# Patient Record
Sex: Male | Born: 1945 | Race: White | Hispanic: No | Smoking: Never smoker
Health system: Southern US, Community
[De-identification: ages and names within clinical notes are randomized; demographics above are authoritative.]

## PROBLEM LIST (undated history)

## (undated) DIAGNOSIS — G4730 Sleep apnea, unspecified: Secondary | ICD-10-CM

## (undated) DIAGNOSIS — Z85828 Personal history of other malignant neoplasm of skin: Secondary | ICD-10-CM

## (undated) DIAGNOSIS — F32A Depression, unspecified: Secondary | ICD-10-CM

## (undated) DIAGNOSIS — C3490 Malignant neoplasm of unspecified part of unspecified bronchus or lung: Secondary | ICD-10-CM

## (undated) DIAGNOSIS — K219 Gastro-esophageal reflux disease without esophagitis: Secondary | ICD-10-CM

## (undated) DIAGNOSIS — C7A8 Other malignant neuroendocrine tumors: Secondary | ICD-10-CM

## (undated) DIAGNOSIS — C61 Malignant neoplasm of prostate: Secondary | ICD-10-CM

## (undated) DIAGNOSIS — F329 Major depressive disorder, single episode, unspecified: Secondary | ICD-10-CM

## (undated) DIAGNOSIS — C4491 Basal cell carcinoma of skin, unspecified: Secondary | ICD-10-CM

## (undated) DIAGNOSIS — C801 Malignant (primary) neoplasm, unspecified: Secondary | ICD-10-CM

## (undated) DIAGNOSIS — I10 Essential (primary) hypertension: Secondary | ICD-10-CM

## (undated) HISTORY — PX: PENILE PROSTHESIS IMPLANT: SHX240

## (undated) HISTORY — PX: TOE SURGERY: SHX1073

## (undated) HISTORY — PX: PROSTATECTOMY: SHX69

## (undated) HISTORY — PX: COLONOSCOPY: SHX174

## (undated) HISTORY — PX: EYE SURGERY: SHX253

---
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
## 2007-03-16 HISTORY — PX: MOHS SURGERY: SUR867

## 2011-01-30 ENCOUNTER — Emergency Department (HOSPITAL_COMMUNITY)
Admission: EM | Admit: 2011-01-30 | Discharge: 2011-01-30 | Disposition: A | Discharge status: Other Acute Inpatient Hospital | Payer: Medicare Other | Source: Home / Self Care | Attending: Emergency Medicine | Admitting: Emergency Medicine

## 2011-01-30 ENCOUNTER — Telehealth (HOSPITAL_COMMUNITY): Payer: Self-pay | Admitting: Licensed Clinical Social Worker

## 2011-01-30 ENCOUNTER — Encounter: Payer: Self-pay | Admitting: Emergency Medicine

## 2011-01-30 ENCOUNTER — Encounter (HOSPITAL_COMMUNITY): Payer: Self-pay | Admitting: *Deleted

## 2011-01-30 ENCOUNTER — Inpatient Hospital Stay (HOSPITAL_COMMUNITY)
Admission: RE | Admit: 2011-01-30 | Discharge: 2011-02-11 | DRG: 885 | Disposition: A | Discharge status: Home / Self Care | Payer: Medicare Other | Source: Ambulatory Visit | Attending: Psychiatry | Admitting: Psychiatry

## 2011-01-30 DIAGNOSIS — Z880 Allergy status to penicillin: Secondary | ICD-10-CM

## 2011-01-30 DIAGNOSIS — F339 Major depressive disorder, recurrent, unspecified: Secondary | ICD-10-CM | POA: Diagnosis present

## 2011-01-30 DIAGNOSIS — Z818 Family history of other mental and behavioral disorders: Secondary | ICD-10-CM

## 2011-01-30 DIAGNOSIS — F332 Major depressive disorder, recurrent severe without psychotic features: Principal | ICD-10-CM

## 2011-01-30 DIAGNOSIS — Z6379 Other stressful life events affecting family and household: Secondary | ICD-10-CM

## 2011-01-30 DIAGNOSIS — F411 Generalized anxiety disorder: Secondary | ICD-10-CM

## 2011-01-30 DIAGNOSIS — Z7982 Long term (current) use of aspirin: Secondary | ICD-10-CM

## 2011-01-30 DIAGNOSIS — R45851 Suicidal ideations: Secondary | ICD-10-CM

## 2011-01-30 DIAGNOSIS — G4700 Insomnia, unspecified: Secondary | ICD-10-CM

## 2011-01-30 DIAGNOSIS — Z886 Allergy status to analgesic agent status: Secondary | ICD-10-CM

## 2011-01-30 DIAGNOSIS — F1994 Other psychoactive substance use, unspecified with psychoactive substance-induced mood disorder: Secondary | ICD-10-CM

## 2011-01-30 DIAGNOSIS — Z79899 Other long term (current) drug therapy: Secondary | ICD-10-CM

## 2011-01-30 DIAGNOSIS — F1010 Alcohol abuse, uncomplicated: Secondary | ICD-10-CM | POA: Diagnosis present

## 2011-01-30 DIAGNOSIS — H548 Legal blindness, as defined in USA: Secondary | ICD-10-CM

## 2011-01-30 DIAGNOSIS — Z8546 Personal history of malignant neoplasm of prostate: Secondary | ICD-10-CM

## 2011-01-30 HISTORY — DX: Depression, unspecified: F32.A

## 2011-01-30 HISTORY — DX: Major depressive disorder, single episode, unspecified: F32.9

## 2011-01-30 LAB — CBC
HCT: 39.4 % (ref 39.0–52.0)
Hemoglobin: 14.4 g/dL (ref 13.0–17.0)
MCH: 32.7 pg (ref 26.0–34.0)
MCHC: 36.5 g/dL — ABNORMAL HIGH (ref 30.0–36.0)
MCV: 89.3 fL (ref 78.0–100.0)

## 2011-01-30 LAB — RAPID URINE DRUG SCREEN, HOSP PERFORMED
Cocaine: NOT DETECTED
Opiates: NOT DETECTED

## 2011-01-30 LAB — BASIC METABOLIC PANEL
BUN: 12 mg/dL (ref 6–23)
CO2: 26 mEq/L (ref 19–32)
Chloride: 99 mEq/L (ref 96–112)
Creatinine, Ser: 0.95 mg/dL (ref 0.50–1.35)

## 2011-01-30 MED ORDER — ACETAMINOPHEN 325 MG PO TABS
650.0000 mg | ORAL_TABLET | ORAL | Status: DC | PRN
  Administered 2011-01-30: 650 mg via ORAL
  Filled 2011-01-30: qty 1

## 2011-01-30 MED ORDER — VITAMIN B-1 100 MG PO TABS
100.0000 mg | ORAL_TABLET | Freq: Every day | ORAL | Status: DC
  Administered 2011-01-31 – 2011-02-11 (×12): 100 mg via ORAL
  Filled 2011-01-30 (×13): qty 1

## 2011-01-30 MED ORDER — DULOXETINE HCL 30 MG PO CPEP
30.0000 mg | ORAL_CAPSULE | Freq: Every day | ORAL | Status: DC
  Administered 2011-01-31 – 2011-02-01 (×2): 30 mg via ORAL
  Filled 2011-01-30 (×2): qty 1

## 2011-01-30 MED ORDER — IBUPROFEN 200 MG PO TABS
600.0000 mg | ORAL_TABLET | Freq: Three times a day (TID) | ORAL | Status: DC | PRN
  Filled 2011-01-30: qty 1
  Filled 2011-01-30: qty 2

## 2011-01-30 MED ORDER — OLANZAPINE 5 MG PO TABS
5.0000 mg | ORAL_TABLET | Freq: Every day | ORAL | Status: DC
  Administered 2011-01-30 – 2011-02-10 (×12): 5 mg via ORAL
  Filled 2011-01-30 (×13): qty 1

## 2011-01-30 MED ORDER — THIAMINE HCL 100 MG/ML IJ SOLN: 100.0000 mg | Freq: Once | INTRAMUSCULAR | Status: DC

## 2011-01-30 MED ORDER — HYDROXYZINE PAMOATE 25 MG PO CAPS
25.0000 mg | ORAL_CAPSULE | Freq: Four times a day (QID) | ORAL | Status: AC | PRN
  Administered 2011-01-31: 25 mg via ORAL

## 2011-01-30 MED ORDER — ONDANSETRON 4 MG PO TBDP: 4.0000 mg | ORAL_TABLET | Freq: Four times a day (QID) | ORAL | Status: AC | PRN

## 2011-01-30 MED ORDER — IBUPROFEN 600 MG PO TABS: 600.0000 mg | ORAL_TABLET | Freq: Three times a day (TID) | ORAL | Status: DC | PRN

## 2011-01-30 MED ORDER — THERA M PLUS PO TABS
1.0000 | ORAL_TABLET | Freq: Every day | ORAL | Status: DC
  Administered 2011-01-31 – 2011-02-05 (×6): 1 via ORAL
  Administered 2011-02-06 – 2011-02-07 (×2): via ORAL
  Administered 2011-02-08 – 2011-02-11 (×4): 1 via ORAL
  Filled 2011-01-30 (×12): qty 1

## 2011-01-30 MED ORDER — ONDANSETRON HCL 8 MG PO TABS: 4.0000 mg | ORAL_TABLET | Freq: Three times a day (TID) | ORAL | Status: DC | PRN

## 2011-01-30 MED ORDER — DULOXETINE HCL 30 MG PO CPEP
30.0000 mg | ORAL_CAPSULE | Freq: Every day | ORAL | Status: DC
  Filled 2011-01-30: qty 1

## 2011-01-30 MED ORDER — CHLORDIAZEPOXIDE HCL 25 MG PO CAPS: 25.0000 mg | ORAL_CAPSULE | Freq: Four times a day (QID) | ORAL | Status: AC | PRN

## 2011-01-30 MED ORDER — ASPIRIN EC 81 MG PO TBEC
81.0000 mg | DELAYED_RELEASE_TABLET | Freq: Every day | ORAL | Status: DC
  Administered 2011-01-31 – 2011-02-11 (×12): 81 mg via ORAL
  Filled 2011-01-30 (×13): qty 1

## 2011-01-30 MED ORDER — OLANZAPINE 5 MG PO TABS
5.0000 mg | ORAL_TABLET | Freq: Every day | ORAL | Status: DC
  Filled 2011-01-30: qty 1

## 2011-01-30 MED ORDER — ACETAMINOPHEN 325 MG PO TABS: 650.0000 mg | ORAL_TABLET | ORAL | Status: DC | PRN

## 2011-01-30 MED ORDER — ASPIRIN EC 81 MG PO TBEC
81.0000 mg | DELAYED_RELEASE_TABLET | Freq: Every day | ORAL | Status: DC
  Filled 2011-01-30: qty 1

## 2011-01-30 MED ORDER — LOPERAMIDE HCL 2 MG PO CAPS: 2.0000 mg | ORAL_CAPSULE | ORAL | Status: AC | PRN

## 2011-01-30 MED ORDER — ZOLPIDEM TARTRATE 5 MG PO TABS
10.0000 mg | ORAL_TABLET | Freq: Every day | ORAL | Status: DC
Start: 2011-01-30 — End: 2011-01-30

## 2011-01-30 MED ORDER — ONDANSETRON HCL 4 MG PO TABS: 4.0000 mg | ORAL_TABLET | Freq: Three times a day (TID) | ORAL | Status: DC | PRN

## 2011-01-30 MED ORDER — ZOLPIDEM TARTRATE 5 MG PO TABS
5.0000 mg | ORAL_TABLET | Freq: Every day | ORAL | Status: DC
  Administered 2011-01-30: 5 mg via ORAL
  Filled 2011-01-30: qty 1

## 2011-01-30 NOTE — ED Notes (Signed)
Pt eating lunch at this time.  Pts daughter at bedside with pt.  Pt calm and cooperative and i NAD at this time.

## 2011-01-30 NOTE — ED Notes (Signed)
Per ACT team counselor pt is to be picked up at 2000 for transport to [facility].
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
## 2011-01-30 NOTE — BH Assessment (Incomplete)
Assessment Note   Alex Underwood is an 65 y.o. male.   {Axis Diagnosis:3049000}  Past Medical History:  Past Medical History  Diagnosis Date  . Depression     No past surgical history on file.  Family History: No family history on file.  Social History:  does not have a smoking history on file. He does not have any smokeless tobacco history on file. He reports that he drinks about 14.4 ounces of alcohol per week. He reports that he does not use illicit drugs.  Allergies:  Allergies  Allergen Reactions  . Codeine Other (See Comments)    Hallucinations.  . Penicillins Rash    Home Medications:  Medications Prior to Admission  Medication Dose Route Frequency Provider Last Rate Last Dose  . acetaminophen (TYLENOL) tablet 650 mg  650 mg Oral Q4H PRN Lyanne Co, MD   650 mg at 01/30/11 1226  . aspirin EC tablet 81 mg  81 mg Oral Daily Lyanne Co, MD      . DULoxetine (CYMBALTA) DR capsule 30 mg  30 mg Oral Daily Lyanne Co, MD      . ibuprofen (ADVIL,MOTRIN) tablet 600 mg  600 mg Oral Q8H PRN Lyanne Co, MD      . OLANZapine [facility]) tablet 5 mg  5 mg Oral QHS Lyanne Co, MD      . ondansetron [facility]) tablet 4 mg  4 mg Oral Q8H PRN Lyanne Co, MD      . zolpidem [facility]) tablet 10 mg  10 mg Oral QHS Lyanne Co, MD       No current outpatient prescriptions on file as of 01/30/2011.    OB/GYN Status:  No LMP for male patient.                                             Disposition:     On Site Evaluation by:   Reviewed with Physician:     Melynda Ripple [facility] 01/30/2011 5:32 PM

## 2011-01-30 NOTE — ED Notes (Signed)
Pt. Stated, he took his Aspiirin this am

## 2011-01-30 NOTE — ED Provider Notes (Signed)
History     CSN: [id number] Arrival date & time: 01/30/2011 10:27 AM   First MD Initiated Contact with Patient 01/30/11 1111      Chief Complaint  Patient presents with  . Depression    (Consider location/radiation/quality/duration/timing/severity/associated sxs/prior treatment) The history is provided by the patient and the spouse.  hx of depression. Hx of SI approx 10 years ago requiring hospitalization. Reports several weeks of constant worsening depression. Increased tearfullness and thoughts of suicide without a true plan. Compliant with his antidepressants. Nothing worsens or improves his symptoms. They are moderate to severe. No other complaints at this time  Past Medical History  Diagnosis Date  . Depression     History reviewed. No pertinent past surgical history.  History reviewed. No pertinent family history.  History  Substance Use Topics  . Smoking status: Not on file  . Smokeless tobacco: Not on file  . Alcohol Use: 14.4 oz/week    24 Cans of beer per week      Review of Systems  All other systems reviewed and are negative.    Allergies  Codeine and Penicillins  Home Medications   Current Outpatient Rx  Name Route Sig Dispense Refill  . ASPIRIN EC 81 MG PO TBEC Oral Take 81 mg by mouth daily.      . DULOXETINE HCL 30 MG PO CPEP Oral Take 30 mg by mouth daily.      Marland Kitchen OLANZAPINE 5 MG PO TABS Oral Take 5 mg by mouth at bedtime.      Marland Kitchen OVER THE COUNTER MEDICATION Oral Take 1 tablet by mouth daily. Stool softener.     Marland Kitchen ZOLPIDEM TARTRATE 10 MG PO TABS Oral Take 10 mg by mouth at bedtime.        BP 135/65  Pulse 71  Temp(Src) 97.4 F (36.3 C) (Oral)  Resp 17  SpO2 100%  Physical Exam  Nursing note and vitals reviewed. Constitutional: He is oriented to person, place, and time. He appears well-developed and well-nourished.  HENT:  Head: Normocephalic and atraumatic.  Eyes: EOM are normal.  Neck: Normal range of motion.  Cardiovascular:  Normal rate, regular rhythm, normal heart sounds and intact distal pulses.   Pulmonary/Chest: Effort normal and breath sounds normal. No respiratory distress.  Abdominal: Soft. He exhibits no distension. There is no tenderness.  Musculoskeletal: Normal range of motion.  Neurological: He is alert and oriented to person, place, and time.  Skin: Skin is warm and dry.  Psychiatric: Judgment normal.       Flat affect. Suicidal thoughts     ED Course  Procedures (including critical care time)   Labs Reviewed  URINE RAPID DRUG SCREEN (HOSP PERFORMED)  BASIC METABOLIC PANEL  ETHANOL  CBC   No results found.   No diagnosis found.    MDM  Depression with suicidal thoughts. Will require placement. Voluntary at this point. Hx of SI in the past, 10 years ago.   11:46 AM Spoke with ACT team who will evaluate the pt in the ER  Lyanne Co, MD 01/30/11 1147

## 2011-01-30 NOTE — ED Notes (Signed)
Pt ambulating to the bathroom with sitter.

## 2011-01-30 NOTE — ED Notes (Signed)
Family at bedside.  Pts daughters name is Sales executive.  Contact number is cell phone [phone].

## 2011-01-30 NOTE — ED Notes (Signed)
Sitter at bedside.  Dinner given to patient .  Did not take ASA or Cymbalta due to having taken it prior to coming to the ED this AM

## 2011-01-30 NOTE — ED Notes (Signed)
Pt.stated, I've had suicidal thoughts for a couple of weeks ago.

## 2011-01-30 NOTE — BH Assessment (Addendum)
Assessment Note   Alex Underwood is an 65 y.o. male. Patient presents in [facility] due to present SI over the past 4 weeks. Patients denies AVH. Pateint reports that he has become "more tearful" within the past week and he is unsure of where tearfulness has come. Patient reports that he has noticed that he is tearful during times which should "bring him joy". 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 Patient reports that he has recently started having thoughts of wanting to harm others; however, he has no identified victim or intentions of harming anyone. Patient reports that he drinks 4 beers daily and he began drinking beer at the age of 46. Patient reports a family history of mental health to include 3 siblings with the diagnosis of depression and his father suffered from alcoholism. Patient also reported that his paternal uncle committed suicide.   Patient expressed that he is unable to contract for safety and he would like inpatient treatment at this time. Assessment Counselor contacted [facility] to inquire about bed availability. Assessment Counselor was informed that there is a bed available on the 500 hall; and Assessment Counselor faxed required information to [facility] for possible placement.  Axis I: Depressive Disorder NOS Axis II: No diagnosis Axis III:   Prostate cancer, penial implant Axis IV: problems with primary support group Axis V: 31-40 impairment in reality testing Past Medical History:   History reviewed. No pertinent past surgical history.  Family History: History reviewed. No pertinent family history.  Social History:  does not have a smoking history on file. He does not have any smokeless tobacco history on file. He reports that he drinks about 14.4 ounces of alcohol per week. He reports that he does not use illicit drugs.  Allergies:    Allergies  Allergen Reactions  . Codeine Other (See Comments)    Hallucinations.  . Penicillins Rash    Home Medications:  Medications Prior to Admission  Medication Dose Route Frequency Provider Last Rate Last Dose  . acetaminophen (TYLENOL) tablet 650 mg  650 mg Oral Q4H PRN Lyanne Co, MD   650 mg at 01/30/11 1226  . aspirin EC tablet 81 mg  81 mg Oral Daily Lyanne Co, MD      . DULoxetine (CYMBALTA) DR capsule 30 mg  30 mg Oral Daily Lyanne Co, MD      . ibuprofen (ADVIL,MOTRIN) tablet 600 mg  600 mg Oral Q8H PRN Lyanne Co, MD      . OLANZapine [facility]) tablet 5 mg  5 mg Oral QHS Lyanne Co, MD      . ondansetron [facility]) tablet 4 mg  4 mg Oral Q8H PRN Lyanne Co, MD      . zolpidem [facility]) tablet 10 mg  10 mg Oral QHS Lyanne Co, MD       No current outpatient prescriptions on file as of 01/30/2011.    OB/GYN Status:  No LMP for male patient.  General Assessment Data Living Arrangements: Spouse/significant other Can pt return to current living arrangement?: Yes Admission Status: Voluntary Is patient capable of signing voluntary admission?: Yes Transfer from: Acute Hospital Referral Source: Self/Family/Friend  Risk to self Suicidal Ideation: Yes-Currently Present Suicidal Intent: No Is patient at risk for suicide?: Yes Suicidal Plan?: No Access to Means: Yes Specify Access  to Suicidal Means: Pt stated that he could jump out of a window What has been your use of drugs/alcohol within the last 12 months?: 4 beers daily Other Self Harm Risks: none Triggers for Past Attempts: Spouse contact Intentional Self Injurious Behavior: None Factors that decrease suicide risk: Religious beliefs Family Suicide History: No Recent stressful life event(s): Conflict (Comment) (felling distant from wife of 42 years) Persecutory voices/beliefs?: No Depression: Yes Depression Symptoms: Insomnia;Tearfulness;Fatigue;Guilt;Loss of interest in usual  pleasures;Feeling worthless/self pity;Feeling angry/irritable Substance abuse history and/or treatment for substance abuse?: No Suicide prevention information given to non-admitted patients: Not applicable  Risk to Others Homicidal Ideation: Yes-Currently Present Thoughts of Harm to Others: Yes-Currently Present Comment - Thoughts of Harm to Others: Pt stated that he has "random" HI Current Homicidal Intent: No Current Homicidal Plan: No Access to Homicidal Means: Yes (knives in the home) Describe Access to Homicidal Means: knives in home Identified Victim: No identified victim History of harm to others?: No Assessment of Violence: None Noted Violent Behavior Description: no violent behavior Does patient have access to weapons?: Yes (Comment) (knives in the home) Criminal Charges Pending?: No Does patient have a court date: No  Mental Status Report Appear/Hygiene: Other (Comment) (appropriate) Eye Contact: Poor Motor Activity: Unremarkable Speech: Logical/coherent Level of Consciousness: Alert Mood: Depressed Affect: Depressed Anxiety Level: Moderate Thought Processes: Coherent Judgement: Impaired Orientation: Place;Time;Situation Obsessive Compulsive Thoughts/Behaviors: Moderate  Cognitive Functioning Concentration: Normal Memory: Recent Intact;Remote Intact IQ: Average Insight: Poor Impulse Control: Fair Appetite: Good Sleep: Decreased Total Hours of Sleep: 4  Vegetative Symptoms: None  Prior Inpatient/Outpatient Therapy Prior Therapy: Inpatient Prior Therapy Dates: 2000 Prior Therapy Facilty/Provider(s): [facility] ([place], [place]) Reason for Treatment: clinical depression            Values / Beliefs Cultural Requests During Hospitalization: None Spiritual Requests During Hospitalization: None        Additional Information 1:1 In Past 12 Months?: No CIRT Risk: No Elopement Risk: No Does patient have medical clearance?: No     Disposition:    Disposition Disposition of Patient: Referred to [facility]) Patient referred to:  [facility]) Patient was accepted at [facility] on 01/30/2011 at 18:00; transferring from Dr. Freida Busman to Dr. Allena Katz. Patient will be in room 501-1 at [facility]. Counselor Anselm Jungling completed all supporting paperwork with the patient and faxed all documents to [facility]. Counselor notified RN of the admittance to [facility].  On Site Evaluation by:   Reviewed with Physician:     Sheran Spine Meztli Llanas 01/30/2011 3:59 PM

## 2011-01-30 NOTE — ED Notes (Signed)
Belongings in locker 2 and 3

## 2011-01-31 MED ORDER — CHLORPROMAZINE HCL 50 MG PO TABS
50.0000 mg | ORAL_TABLET | Freq: Every evening | ORAL | Status: DC
  Administered 2011-01-31: 50 mg via ORAL

## 2011-01-31 MED ORDER — ASPIRIN EC 81 MG PO TBEC: 81.0000 mg | DELAYED_RELEASE_TABLET | Freq: Every day | ORAL | Status: DC

## 2011-01-31 MED ORDER — CHLORPROMAZINE HCL 25 MG PO TABS
25.0000 mg | ORAL_TABLET | Freq: Three times a day (TID) | ORAL | Status: DC
  Administered 2011-01-31 – 2011-02-01 (×3): 25 mg via ORAL
  Filled 2011-01-31 (×4): qty 1

## 2011-01-31 MED ORDER — DULOXETINE HCL 30 MG PO CPEP: 30.0000 mg | ORAL_CAPSULE | Freq: Every day | ORAL | Status: DC

## 2011-01-31 MED ORDER — ZOLPIDEM TARTRATE 10 MG PO TABS
10.0000 mg | ORAL_TABLET | Freq: Every day | ORAL | Status: DC
  Administered 2011-01-31 – 2011-02-10 (×11): 10 mg via ORAL
  Filled 2011-01-31 (×11): qty 1

## 2011-01-31 MED ORDER — OLANZAPINE 5 MG PO TABS: 5.0000 mg | ORAL_TABLET | Freq: Every day | ORAL | Status: DC
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
## 2011-01-31 NOTE — Progress Notes (Signed)
Suicide Risk Assessment  Admission Assessment     Demographic factors:   pt Current Mental Status:   admitts si thoughts, affect ristricted, mood is down, intact awareness into his problems, denies hi Loss Factors:   relatoionship problems with wife, plan not stay with him anymore Historical Factors:   no si attempts in past Risk Reduction Factors:   good family support (daughter and other close family members), plan to stay with daughter after discharge  CLINICAL FACTORS:   Depression:   Hopelessness  COGNITIVE FEATURES THAT CONTRIBUTE TO RISK:  Closed-mindedness    SUICIDE RISK:   Mild:  Suicidal ideation of limited frequency, intensity, duration, and specificity.  There are no identifiable plans, no associated intent, mild dysphoria and related symptoms, good self-control (both objective and subjective assessment), few other risk factors, and identifiable protective factors, including available and accessible social support.  PLAN OF CARE: 1. Continue to observe 2. Start meds and group therapy  Wonda Cerise 01/31/2011, 11:59 AM

## 2011-01-31 NOTE — Progress Notes (Signed)
  Writer spent 1:1 time with pt at beginning of shift. Pt requested another pillow for his room d/t  him needing to sleep with a pillow between his legs because of his hip. Pt reported not sleeping well last night and writer informed pt that his Remus Loffler was increased  to 10 mg Pt was pleased, Clinical research associate assisted pt to set up his c-pap machine for bedtime. Pt was pleasant and cooperative, requested his medications at 2130. Denies having pain, -SI/HI/A/V hall. Safety maintained on unit will continue to monitor.
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
## 2011-01-31 NOTE — H&P (Signed)
Alex CHAVARIN is an 65 y.o. male.   Chief Complaint: Depression and tearfulness  HPI: Wife of 42 years wanted to get away from him. He reports more frequent anxiety attacks and stays depressed. They have just moved to [facility] for the next 5 mos and had stopped in [place] to visit their daughter. He felt he should be admitted.  Today he reports he drinks 4 beers a day and usually goes and buys it before lunch. Says he tries to not drink as he knows it interferes with his meds but he has only been able to not drink for 3 days when he has to drink. Back in April was put on Cymbalta Zyprexa and Ambien by a psychiatrist in [place] where his main residence is. Cymbalta at 60mg  gave him tremors. Would like something to help with anxiety.  Past Medical History  Diagnosis Date  . Depression     Since 04/12    No past surgical history on file.  No family history on file. Social History:  does not have a smoking history on file. He does not have any smokeless tobacco history on file. He reports that he drinks about 14.4 ounces of alcohol per week. He reports that he does not use illicit drugs.  Allergies:  Allergies  Allergen Reactions  . Codeine Other (See Comments)    Hallucinations.  . Penicillins Rash    Medications Prior to Admission  Medication Dose Route Frequency Provider Last Rate Last Dose  . acetaminophen (TYLENOL) tablet 650 mg  650 mg Oral Q4H PRN Jorje Guild, PA      . aspirin EC tablet 81 mg  81 mg Oral Daily Jorje Guild, [place]   81 mg at 01/31/11 0858  . aspirin EC tablet 81 mg  81 mg Oral Daily Mickie D. Aixa Corsello, PA      . chlordiazePOXIDE (LIBRIUM) capsule 25 mg  25 mg Oral Q6H PRN Jorje Guild, PA      . chlorproMAZINE (THORAZINE) tablet 25 mg  25 mg Oral TID Mickie D. Geralyn Figiel, PA      . chlorproMAZINE (THORAZINE) tablet 50 mg  50 mg Oral Nightly Mickie D. Pernell Dupre, PA      . DULoxetine (CYMBALTA) DR capsule 30 mg  30 mg Oral Daily Jorje Guild, PA   30 mg at 01/31/11 0859  .  DULoxetine (CYMBALTA) DR capsule 30 mg  30 mg Oral Daily Mickie D. Manda Holstad, PA      . hydrOXYzine (VISTARIL) capsule 25 mg  25 mg Oral Q6H PRN Jorje Guild, PA   25 mg at 01/31/11 1610  . ibuprofen (ADVIL,MOTRIN) tablet 600 mg  600 mg Oral Q8H PRN Jorje Guild, PA      . loperamide (IMODIUM) capsule 2-4 mg  2-4 mg Oral PRN Jorje Guild, PA      . multivitamins ther. w/minerals tablet 1 tablet  1 tablet Oral Daily Jorje Guild, PA   1 tablet at 01/31/11 0859  . OLANZapine (ZYPREXA) tablet 5 mg  5 mg Oral QHS Jorje Guild, PA   5 mg at 01/30/11 2337  . OLANZapine (ZYPREXA) tablet 5 mg  5 mg Oral QHS Mickie D. Egan Berkheimer, PA      . ondansetron [facility]) tablet 4 mg  4 mg Oral Q8H PRN Jorje Guild, PA      . ondansetron (ZOFRAN-ODT) disintegrating tablet 4 mg  4 mg Oral Q6H PRN Jorje Guild, PA      . thiamine (B-1) injection 100 mg  100 mg Intramuscular Once Jorje Guild, [place]      . thiamine (VITAMIN B-1) tablet 100 mg  100 mg Oral Daily Jorje Guild, PA   100 mg at 01/31/11 0858  . zolpidem (AMBIEN) tablet 10 mg  10 mg Oral QHS Mickie D. Shaakira Borrero, PA      . zolpidem (AMBIEN) tablet 5 mg  5 mg Oral QHS Jorje Guild, PA   5 mg at 01/30/11 2334  . DISCONTD: acetaminophen (TYLENOL) tablet 650 mg  650 mg Oral Q4H PRN Lyanne Co, MD   650 mg at 01/30/11 1226  . DISCONTD: aspirin EC tablet 81 mg  81 mg Oral Daily Lyanne Co, MD      . DISCONTD: DULoxetine (CYMBALTA) DR capsule 30 mg  30 mg Oral Daily Lyanne Co, MD      . DISCONTD: ibuprofen (ADVIL,MOTRIN) tablet 600 mg  600 mg Oral Q8H PRN Lyanne Co, MD      . DISCONTD: OLANZapine (ZYPREXA) tablet 5 mg  5 mg Oral QHS Lyanne Co, MD      . DISCONTD: ondansetron [facility]) tablet 4 mg  4 mg Oral Q8H PRN Lyanne Co, MD      . DISCONTD: zolpidem (AMBIEN) tablet 10 mg  10 mg Oral QHS Lyanne Co, MD       No current outpatient prescriptions on file as of 01/31/2011.    Results for orders placed during the hospital encounter of 01/30/11 (from the past 48 hour(s))  URINE  RAPID DRUG SCREEN (HOSP PERFORMED)     Status: Normal   Collection Time   01/30/11 11:07 AM      Component Value Range Comment   Opiates NONE DETECTED  NONE DETECTED     Cocaine NONE DETECTED  NONE DETECTED     Benzodiazepines NONE DETECTED  NONE DETECTED     Amphetamines NONE DETECTED  NONE DETECTED     Tetrahydrocannabinol NONE DETECTED  NONE DETECTED     Barbiturates NONE DETECTED  NONE DETECTED    BASIC METABOLIC PANEL     Status: Abnormal   Collection Time   01/30/11 11:17 AM      Component Value Range Comment   Sodium 136  135 - 145 (mEq/L)    Potassium 3.9  3.5 - 5.1 (mEq/L)    Chloride 99  96 - 112 (mEq/L)    CO2 26  19 - 32 (mEq/L)    Glucose, Bld 122 (*) 70 - 99 (mg/dL)    BUN 12  6 - 23 (mg/dL)    Creatinine, Ser 1.61  0.50 - 1.35 (mg/dL)    Calcium 9.3  8.4 - 10.5 (mg/dL)    GFR calc non Af Amer 85 (*) >90 (mL/min)    GFR calc Af Amer >90  >90 (mL/min)   ETHANOL     Status: Normal   Collection Time   01/30/11 11:17 AM      Component Value Range Comment   Alcohol, Ethyl (B) <11  0 - 11 (mg/dL)   CBC     Status: Abnormal   Collection Time   01/30/11 11:17 AM      Component Value Range Comment   WBC 5.2  4.0 - 10.5 (K/uL)    RBC 4.41  4.22 - 5.81 (MIL/uL)    Hemoglobin 14.4  13.0 - 17.0 (g/dL)    HCT 09.6  04.5 - 40.9 (%)    MCV 89.3  78.0 - 100.0 (fL)  MCH 32.7  26.0 - 34.0 (pg)    MCHC 36.5 (*) 30.0 - 36.0 (g/dL)    RDW 13.0  86.5 - 78.4 (%)    Platelets 176  150 - 400 (K/uL)    No results found.  Review of Systems  Constitutional: Negative.   HENT: Negative.   Eyes:       Legally blind  Respiratory: Negative.        Sleeps with CPAP  Cardiovascular: Negative.   Gastrointestinal: Negative.   Genitourinary:       S/p ca of prostate and has a penile implant   Musculoskeletal: Negative.   Skin: Negative.   Neurological: Negative.   Endo/Heme/Allergies: Negative.   Psychiatric/Behavioral: Positive for depression, suicidal ideas and substance abuse.  The patient has insomnia.     Blood pressure 156/76, pulse 80, temperature 97.4 F (36.3 C), temperature source Oral, resp. rate 16, height 6\' 8"  (2.032 m), weight 102.513 kg (226 lb). Physical Exam  Constitutional: He appears well-developed.  HENT:  Head: Normocephalic and atraumatic.  Neck: Normal range of motion. Neck supple.  Cardiovascular: Normal rate and regular rhythm.   Respiratory: Effort normal and breath sounds normal.  GI: Soft.  Genitourinary:       Has a penile implant   Musculoskeletal: Normal range of motion.  Neurological: He is alert.  Skin: Skin is warm and dry.  Psychiatric: He has a normal mood and affect.     Assessment/Plan Add Thorazine to help with anxiety  Check Thyroid function  Namira Rosekrans,MICKIE D. 01/31/2011, 12:49 PM

## 2011-01-31 NOTE — Progress Notes (Signed)
Pt. attended and participated in aftercare planning group. [facility] support group information was given as well as information on suicide prevention information, warning signs to look for with suicide and crisis line numbers to use. Pt. Stated that he is visiting from [place] and has been having depression for 2-3 weeks.

## 2011-01-31 NOTE — Progress Notes (Signed)
  H &P was reviewed and I agree with key elements and treatment plan. Please see H& P dated 01/31/2011. 

## 2011-01-31 NOTE — Progress Notes (Signed)
  01/31/2011 Nrsg 1835 D Alex Underwood is seen OOB UAL on the [address] hall tolerated fair. HE is flat and has a blunted affect. He has attended his groups. HE visited with friends this evening and has been compliant with meds. A He refused Librium that was offered to him, stating he didn't need it and he didn't want to take it, in conjunction with his other meds. This nurse explained to him the reason for the thorazine and the librium and pt was adamant not to take libium.COnsequently, it was not given. R Safety is maintained and POC includes maintaining therapeutic alliance. PD RN [facility]

## 2011-02-01 DIAGNOSIS — F339 Major depressive disorder, recurrent, unspecified: Secondary | ICD-10-CM | POA: Diagnosis present

## 2011-02-01 DIAGNOSIS — F411 Generalized anxiety disorder: Secondary | ICD-10-CM | POA: Diagnosis present

## 2011-02-01 DIAGNOSIS — F1010 Alcohol abuse, uncomplicated: Secondary | ICD-10-CM | POA: Diagnosis present

## 2011-02-01 DIAGNOSIS — F329 Major depressive disorder, single episode, unspecified: Secondary | ICD-10-CM

## 2011-02-01 MED ORDER — DULOXETINE HCL 30 MG PO CPEP
30.0000 mg | ORAL_CAPSULE | ORAL | Status: DC
  Administered 2011-02-01 – 2011-02-02 (×2): 30 mg via ORAL
  Filled 2011-02-01 (×3): qty 1

## 2011-02-01 NOTE — Progress Notes (Signed)
Patient ID: Alex Underwood, male   DOB: 01-03-1946, 65 y.o.   MRN: [id number] Pt. Verbalized feeling "less depressed", but still 'feels depressed".  Pt. Denies SI/HI and denies A/v hallucinations.  Pt. Reports a small BM today, but would still like to take "something for constipation" at HS. Pt. Cooperative with meds and appreciative of care given.  Cont. On q 15 min observations for safety and is safe at this time.

## 2011-02-01 NOTE — Progress Notes (Signed)
[facility] MD Progress Note  02/01/2011 1:12 PM  Diagnosis:  Axis I:  Major Depressive Disorder - Recurrent. Generalized Anxiety Disorder. Alcohol Abuse  The patient was seen today and reports the following:   ADL's: Intact  Sleep: The patient reports to sleeping well last night but feels oversedated with the Thorazine.  Appetite: Yes, AEB: Good Appetite.   Mild>(1-10) >Severe  Hopelessness (1-10): 7  Depression (1-10): 7  Anxiety (1-10): 7   Suicidal Ideation: The patient denies any current suicidal ideations today. Plan:  No Intent: No  Means: No  Homicidal Ideation: The patient denies any homicidal ideations today. Plan: No  Intent: No.  Means: No   General Appearance /Behavior: Neat and Casual  Eye Contact: Good  Motor Behavior: Normal  Speech: Normal  Mental Status: AO x 3  Level of Consciousness: Alert  Mood: Depressed - Moderate  Affect: Mildly Constricted  Anxiety Level: Moderately anxious.  Thought Process: Coherent  Thought Content: WNL. No auditory or visual hallucinations or delusional thinking.  Perception: Normal  Judgment: Fair to Good.  Insight: Fair to Good Cognition: Orientation time, place and person  Sleep:  Number of Hours: 6.75   Vital Signs:Blood pressure 135/87, pulse 62, temperature 97.4 F (36.3 C), temperature source Oral, resp. rate 18, height 6\' 8"  (2.032 m), weight 102.513 kg (226 lb).  Lab Results:  Results for orders placed during the hospital encounter of 01/30/11 (from the past 48 hour(s))  TSH     Status: Normal   Collection Time   01/31/11  7:25 PM      Component Value Range Comment   TSH 0.673  0.350 - 4.500 (uIU/mL)   T4, FREE     Status: Normal   Collection Time   01/31/11  7:25 PM      Component Value Range Comment   Free T4 1.07  0.80 - 1.80 (ng/dL)   T3     Status: Abnormal   Collection Time   01/31/11  7:25 PM      Component Value Range Comment   T3, Total 77.2 (*) 80.0 - 204.0 (ng/dl)    Treatment Plan Summary:    1. Daily contact with patient to assess and evaluate symptoms and progress in treatment  2. Medication management  3. The patient will deny suicidal ideations or homicidal ideations for 48 hours prior to discharge and have a depression and anxiety rating of 3 or less. The patient will also deny any auditory or visual hallucinations or delusional thinking.   Plan:  1. Will discontinue the medication Thorazine due to oversedation. 2. Will increase the medication Cymbalta to 30 mgs po qam and 4 pm for depression.  3. Will continue the medication Zyprexa at 5 mgs po qhs and Ambien 10 mgs po qhs at the patient's request for sleep and mood stabilization. 4. Continue to monitor.    Sergi Gellner 02/01/2011, 1:12 PM

## 2011-02-01 NOTE — Progress Notes (Signed)
[facility] Group Notes:  (Counselor/Nursing/MHT/Case Management/Adjunct)  02/01/2011 2:19 PM  Type of Therapy:  Group Therapy  Participation Level:  None  Participation Quality:  Drowsy  Affect:  Blunted  Cognitive:  Appropriate  Insight:  None  Engagement in Group:  Limited  Engagement in Therapy:  None  Modes of Intervention:  Support and Exploration  Summary of Progress/Problems: Trelon dozed off and on throughout group. When awake he did not share personal experience on the topic of holiday blues.   [name] 02/01/2011, 2:19 PM
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
## 2011-02-01 NOTE — Progress Notes (Signed)
Pt observed resting in bed with his eyes closed.  Easily awakened for conversation.  Pt denies self-harm thoughts, but still endorses depression.  Pt has been cooperative with staff and participating with unit activities.  Pt appears very sad/depressed with slumped shoulders.  He states he is here to get his meds regulated.  He says he was taking Thorazine, but it was too strong, so they are trying something else.  Pt voices no concerns/questions.   Safety maintained with q15 minute checks.

## 2011-02-01 NOTE — Tx Team (Signed)
Interdisciplinary Treatment Plan Update (Adult)  Date:  02/01/2011  Time Reviewed:  10:01 AM   Progress in Treatment: Attending groups: Yes Participating in groups:  Yes Taking medication as prescribed: Yes Tolerating medication:  Yes Family/Significant othe contact made: Yes, contact made with daughter, Crystal Patient understands diagnosis:  Yes Discussing patient identified problems/goals with staff:  Yes Medical problems stabilized or resolved:  Yes Denies suicidal/homicidal ideation: Yes Issues/concerns per patient self-inventory:  None identified Other: N/A  New problem(s) identified: None Identified  Reason for Continuation of Hospitalization: Anxiety Depression Suicidal ideation  Interventions implemented related to continuation of hospitalization: mood stabilization, medication monitoring and adjustment, group therapy and psycho education, safety checks q 15 mins  Additional comments: N/A  Estimated length of stay: 3-5 days  Discharge Plan:  Pt will schedule follow up for pt in [place]   New goal(s): N/A  Review of initial/current patient goals per problem list:   1.  Goal(s): Depression  Met:  No  Target date: by discharge  As evidenced by: Reduce depression from a 10 to a 3. Pt ranks depression at a 7.  2.  Goal (s): Suicidal Ideation  Met:  No  Target date: by discharge  As evidenced by: Eliminate suicidal ideation.   3.  Goal(s): Anxiety  Met:  No  Target date: by discharge  As evidenced by: Reduce anxiety from a 10 to a 3.  Pt ranks at a 7 today.    Attendees: Patient:  Alex Underwood 02/01/2011 10:58 AM   Family:     Physician:  Franchot Gallo, MD  02/01/2011  10:01 AM   Nursing:   Quintella Reichert, RN 02/01/2011 10:07 AM   Case Manager:  Reyes Ivan, LCSWA 02/01/2011  10:01 AM   Counselor:  Angus Palms, LCSW 02/01/2011  10:01 AM   Other:  Juline Patch, LCSW 02/01/2011  10:01 AM   Other:  Landry Corporal, NP 02/01/2011 10:02 AM    Other:  Harl Favor, intern 02/01/2011 10:07 AM   Other:      Scribe for Treatment Team:   Carmina Miller, 02/01/2011 , 10:01 AM

## 2011-02-01 NOTE — Progress Notes (Signed)
  Pt reports he slept well and that his appetite is improving.  He says he feels dizzy and lightheaded.  Fall precautions reviewed with pt.  He rates his depression and his hopelessness at 7.  He says he plans to stop drinking beer.  He is appreciative of help given and says he feels very tired and groggy.

## 2011-02-01 NOTE — Progress Notes (Signed)
[facility] Group Notes:  (Counselor/Nursing/MHT/Case Management/Adjunct)  02/01/2011 12:28 PM  Type of Therapy:  Group Counseling  Participation Level:  Minimal  Participation Quality:  Attentive and Drowsy  Affect:  Flat  Cognitive:  Alert  Insight:  Limited  Engagement in Group:  Limited  Engagement in Therapy:  Limited  Modes of Intervention:  Clarification, Problem-solving and Support  Summary of Progress/Problems: Alex Underwood shifted between being alert and dosing off to sleep. However he did demonstrate his understanding of wellness and obstacles that get in the way.  Alex Underwood identified finances as being an important entity to wellness and shared how obstacles in that area impact his capability to provide for his family thus affecting relationships further.   [name] 02/01/2011, 12:28 PM  Cosigned by: Angus Palms, LCSW

## 2011-02-02 MED ORDER — VENLAFAXINE HCL ER 75 MG PO CP24: 75.0000 mg | ORAL_CAPSULE | Freq: Every day | ORAL | Status: DC

## 2011-02-02 MED ORDER — VENLAFAXINE HCL ER 75 MG PO CP24
75.0000 mg | ORAL_CAPSULE | Freq: Every day | ORAL | Status: DC
  Administered 2011-02-03: 75 mg via ORAL
  Filled 2011-02-02 (×2): qty 1

## 2011-02-02 NOTE — Progress Notes (Signed)
Pt attended discharge planning group and actively participated.  Pt presents with flat affect and depressed mood.  Pt ranks depression at a 7-8 and anxiety and a 7-8 today.  Pt reports SI and contracts for safety.  SW scheduled follow up for pt with [facility] for therapy and Dr. Imogene Burn for med management, both in [place].  Pt continued to be stabilized on meds.  Safety planning and suicide prevention discussed.    Reyes Ivan, LCSWA 02/02/2011  10:24 AM

## 2011-02-02 NOTE — Progress Notes (Signed)
[facility] Group Notes:  (Counselor/Nursing/MHT/Case Management/Adjunct)  02/02/2011 12:41 PM  Type of Therapy:  Group Counseling  Participation Level:  None  Participation Quality:  Drowsy  Affect:  Flat  Cognitive:  n/a  Insight:  None  Engagement in Group:  None  Engagement in Therapy:  None  Modes of Intervention:  Clarification, Education, Problem-solving and Support  Summary of Progress/Problems: Alex Underwood was present in group however he did not participate in the session related to recovery and feelings related to diagnoses. He continues to sleep and it appears to be a reaction to medication changes.    [name] 02/02/2011, 12:41 PM  Cosigned by: Angus Palms, LCSW

## 2011-02-02 NOTE — Progress Notes (Signed)
Patient participated in grief and loss group.  Identified patient losses and the importance of non-judgmentally experiencing their feelings.  Discussed ways that patients and support systems avoid uncomfortable feelings and identified the warranted feelings of anger, sadness, and fear associated with painful losses.  Discussed the individual grieving process and ways in which each patient will start their own process after identifying their own losses.  Patient identified a common loss among all patients of their lifestyle and independence and was able to build a sense of community and support. Nicole A. Cain Sieve, LPCA Theda Belfast, M.Div., [facility]

## 2011-02-02 NOTE — Progress Notes (Signed)
[facility] MD Progress Note  02/02/2011 4:04 PM  Diagnosis:  Axis I: Major Depressive Disorder - Recurrent.  Generalized Anxiety Disorder.  Alcohol Abuse   The patient was seen today and reports the following:   ADL's: Intact  Sleep: The patient reports to sleeping well last night with no sedation this morning.  Appetite: Decreased Appetite today.   Mild>(1-10) >Severe  Hopelessness (1-10): 9  Depression (1-10): 7  Anxiety (1-10): 7-8   Suicidal Ideation: The patient reports episodic suicidal ideations today.  Plan: No  Intent: No  Means: No  Homicidal Ideation: The patient denies any homicidal ideations today.  Plan: No  Intent: No.  Means: No   Eye Contact: Good  General Appearance /Behavior: Neat and Casual  Motor Behavior: Normal  Speech: Normal  Mental Status: AO x 3  Level of Consciousness: Alert  Mood: Depressed - Moderate  Affect: Mildly Constricted  Anxiety Level: Moderate to Severely anxious.  Thought Process: Coherent  Thought Content: WNL. No auditory or visual hallucinations or delusional thinking.  Perception: Normal  Judgment: Fair to Good.  Insight: Fair to Good  Cognition: Orientation time, place and person  Sleep:  Number of Hours: 6.75   Vital Signs:Blood pressure 129/73, pulse 93, temperature 98 F (36.7 C), temperature source Oral, resp. rate 18, height 6\' 8"  (2.032 m), weight 102.513 kg (226 lb).  Lab Results:  Results for orders placed during the hospital encounter of 01/30/11 (from the past 48 hour(s))  TSH     Status: Normal   Collection Time   01/31/11  7:25 PM      Component Value Range Comment   TSH 0.673  0.350 - 4.500 (uIU/mL)   T4, FREE     Status: Normal   Collection Time   01/31/11  7:25 PM      Component Value Range Comment   Free T4 1.07  0.80 - 1.80 (ng/dL)   T3     Status: Abnormal   Collection Time   01/31/11  7:25 PM      Component Value Range Comment   T3, Total 77.2 (*) 80.0 - 204.0 (ng/dl)    Treatment Plan  Summary:  1. Daily contact with patient to assess and evaluate symptoms and progress in treatment  2. Medication management  3. The patient will deny suicidal ideations or homicidal ideations for 48 hours prior to discharge and have a depression and anxiety rating of 3 or less. The patient will also deny any auditory or visual hallucinations or delusional thinking.   Plan:  1. Will discontinue Cymbalta today secondary to complaints of blurred vision and dry mouth. 2. Will start Effexor XR 75 mgs po qam for depression and anxiety. 3. Will continue the medication Zyprexa at 5 mgs po qhs and Ambien 10 mgs po qhs at the patient's request for sleep and mood stabilization.  4. Continue to monitor.    Chiquetta Langner 02/02/2011, 4:04 PM

## 2011-02-02 NOTE — Progress Notes (Signed)
Patient ID: Alex Underwood, male   DOB: 1945-11-11, 65 y.o.   MRN: [id number] Pt reports fair sleep and low energy.  He denies any symptoms of detoxing except anxiety.  He rates his depression and hopelessness a 7.   Says that new cymbaltal addition at 4 pm gave him "bad dry mouth and lethargic"  Says he plans to stop drinking beer and "enjoy the daily activities life has to offer.  Pt says he took mom last night with poor results and plans to talk with MD about another laxative.

## 2011-02-02 NOTE — Progress Notes (Signed)
Patient ID: Alex Underwood, male   DOB: 1945/09/26, 65 y.o.   MRN: [id number] Pt. Cooperative with programming and milieu.  Pt. Verbalizing concern about bowels, although reports bowel movement today, stated it was" hard".  Pt. Reports passive SI at times but contracts for safety.  Affect blunted much of the time.  Reports depressions is a "7".  Support given. Pt. Remains on q 15  Min. Observations and is safe at this time.

## 2011-02-03 MED ORDER — VENLAFAXINE HCL ER 150 MG PO CP24
150.0000 mg | ORAL_CAPSULE | Freq: Every day | ORAL | Status: DC
  Administered 2011-02-04 – 2011-02-11 (×8): 150 mg via ORAL
  Filled 2011-02-03 (×9): qty 1

## 2011-02-03 MED ORDER — POLYETHYLENE GLYCOL 3350 17 G PO PACK
17.0000 g | PACK | Freq: Every day | ORAL | Status: DC
  Administered 2011-02-03 – 2011-02-10 (×8): 17 g via ORAL
  Filled 2011-02-03 (×9): qty 1

## 2011-02-03 NOTE — Progress Notes (Signed)
Recreation Therapy Group Note  Date: 02/03/2011          Time: 1415      Group Topic/Focus: The focus of this group is on discussing various styles of communication and communicating assertively using 'I' (feeling) statements.  Participation Level: Active  Participation Quality: Appropriate and Attentive  Affect: Depressed  Cognitive: Oriented   Additional Comments: Patient reports that today is his 42nd wedding anniversary and he was glad he had the opportunity to talk to his wife.   Glinda Natzke 02/03/2011 3:11 PM

## 2011-02-03 NOTE — Progress Notes (Signed)
Pt observed in his room reading a pamphlet.  He has been going to groups and interacting with other patients.  He reports that he feels some better and has hopes that the med changes made today will help him even more.  At the moment he still gives his depression at a 7 and hopelessness at a 7.  He denies SI at this point.  Pt voices no needs/concerns at this time.  Safety maintained with q15 minute checks.

## 2011-02-03 NOTE — Progress Notes (Signed)
[facility] Group Notes:  (Counselor/Nursing/MHT/Case Management/Adjunct)  02/03/2011 12:57 PM  Type of Therapy:  Group Therapy  Participation Level:  Minimal  Participation Quality:  Attentive  Affect:  Flat  Cognitive:  Alert  Insight:  Limited  Engagement in Group:  Limited  Engagement in Therapy:  Limited  Modes of Intervention:  Education  Summary of Progress/Problems: Alex Underwood appeared to understand the process of regulating emotions and how it impacts personal development.  Savio expressed that in order for him to influence other with his emotions, he must trust the individuals he is around.  He also concluded that individuals are often taught to not express emotion by the principles that are taught (save the tears for the pillow). He did not share much of his personal experiences related to emotion regulation however he remained attentive.   Alex Underwood 02/03/2011, 12:57 PM  Cosigned by: Angus Palms, LCSW

## 2011-02-03 NOTE — Progress Notes (Signed)
Patient seen during discharge planning group.  He continues to endorse SI, depression and anxiety.  He signed consent for MD to contact his former MD in [place].    Patient authorized q days per Medicare protocol.

## 2011-02-03 NOTE — Progress Notes (Signed)
Patient ID: Alex Underwood, male   DOB: [birth year], 65 y.o.   MRN: [id number] Pt is awake and active on the unit this AM. Pt affect is flat and mood is depressed but he is cooperative with staff. Pt is attending meals and groups today and is mostly concerned with his medication changes. Pt's goal is to stop drinking and start to enjoy life. Pt denies SI/HI and A/V hallucinations. Pt denies any withdrawal symptoms as well. Writer will continue to monitor.

## 2011-02-03 NOTE — Progress Notes (Signed)
Pt observed on the unit and attended evening group.  Pt is quiet and not easily engaged in conversation.  Encouraged to make needs known to staff.  Pt has voiced no complaints tonight.  His plan is to discharge to his daughter's house and stay with her for a while.  His wife is staying at [facility] at a condo that they had rented for 5 months.  She is asking him for a divorce.  They are from [place] and had stopped in [place] to see their daughter.  Pt realized he need help and was brought to the hospital by his daughter who is very supportive.  Safety maintained with q15 minute checks.

## 2011-02-03 NOTE — Progress Notes (Signed)
[facility] MD Progress Note  02/03/2011 12:35 PM  Diagnosis:  Axis I: Major Depressive Disorder - Recurrent.  Generalized Anxiety Disorder.  Alcohol Abuse   The patient was seen today and reports the following:   ADL's: Intact  Sleep: The patient reports to continuing to sleep well last night with no sedation this morning.  Appetite: Improved today.   Mild>(1-10) >Severe  Hopelessness (1-10): 7  Depression (1-10): 7  Anxiety (1-10): 7  Suicidal Ideation: The patient reports ongoing episodic suicidal ideations today.  Plan: No  Intent: No  Means: No   Homicidal Ideation: The patient adamantly denies any homicidal ideations today.  Plan: No  Intent: No.  Means: No   Eye Contact: Good General Appearance /Behavior: Neat and Casual Motor Behavior: Normal  Speech: Normal  Mental Status: AO x 3  Level of Consciousness: Alert  Mood: Depressed - Moderate  Affect: Moderately Constricted  Anxiety Level: Moderately anxious.  Thought Process: Coherent  Thought Content: WNL. No auditory or visual hallucinations or delusional thinking.  Perception: Normal  Judgment: Fair to Good.  Insight: Fair to Good  Cognition: Orientation time, place and person  Sleep:  Number of Hours: 6.5   Vital Signs:Blood pressure 138/79, pulse 83, temperature 99.1 F (37.3 C), temperature source Oral, resp. rate 18, height 6\' 8"  (2.032 m), weight 102.513 kg (226 lb).  Lab Results: No results found for this or any previous visit (from the past 48 hour(s)).  Treatment Plan Summary:  1. Daily contact with patient to assess and evaluate symptoms and progress in treatment  2. Medication management  3. The patient will deny suicidal ideations or homicidal ideations for 48 hours prior to discharge and have a depression and anxiety rating of 3 or less. The patient will also deny any auditory or visual hallucinations or delusional thinking.   Plan:  1. Will continue current medications today.  If the patient  tolerates the medication Effexor XR without significant side effects, this medications will be increased to further address his depressive and anxiety symptoms. 2. Continue to monitor.  3. Miralax was added as a stool softener.   Kamarie Veno 02/03/2011, 12:35 PM

## 2011-02-03 NOTE — Progress Notes (Signed)
Patient ID: Alex Underwood, male   DOB: 11/17/1945, 65 y.o.   MRN: [id number] Pt is awake and active on the unit this AM. Pt affect is sad and mood is depressed. Pt is attending groups and meals today and is cooperative with staff. Pt is somewhat withdrawn and conversation is minimal. Pt states that he has SI but he is able to contract for safety and does not have a plan to carry out those thoughts. Pt is hopefull that his new medications will work well for him. Writer will continue to monitor pt status.

## 2011-02-04 NOTE — Progress Notes (Signed)
Patient ID: Alex Underwood, male   DOB: [birth year], 65 y.o.   MRN: [id number] Pt is awake and active on the unit this AM. Pt affect and mood are appropriate to situation. Pt denies SI/HI and A/V hallucinations. Pt states that he is feeling good about his medications and that he plans to quit drinking as well. Pt is attending groups and meals. Writer will continue to monitor.

## 2011-02-04 NOTE — Progress Notes (Signed)
[facility] MD Progress Note  02/04/2011 12:51 PM  Diagnosis:   Axis I: See current hospital problem list Axis II: Deferred Axis III:  Past Medical History  Diagnosis Date  . Depression     Since 04/12   Axis IV: Unchanged Axis V: 41-50 serious symptoms  ADL's:  Intact  Sleep:  Yes,  AEB:  Appetite:  Yes,  AEB:  Suicidal Ideation:   Plan:  Yes  Intent:  No  Means:  No  Homicidal Ideation:   Plan:  No  Intent:    Means:    AEB (as evidenced by):  Mental Status: General Appearance Alex Underwood:  Casual Eye Contact:  Good Motor Behavior:  Psychomotor Retardation Speech:  Normal Level of Consciousness:  Lethargic Mood:  Depressed Affect:  Blunt Anxiety Level:  Minimal Thought Process:  Coherent Thought Content:  WNL Perception:  Normal Judgment:  Fair Insight:  Absent Cognition:  Orientation time, place and person Memory Remote Concentration Yes Sleep:  Number of Hours: 5.25   Vital Signs:Blood pressure 132/73, pulse 88, temperature 97.8 F (36.6 C), temperature source Oral, resp. rate 18, height 6\' 8"  (2.032 m), weight 102.513 kg (226 lb).  Lab Results: No results found for this or any previous visit (from the past 48 hour(s)).  Treatment Plan Summary: Daily contact with patient to assess and evaluate symptoms and progress in treatment Medication management Check testosterone level  Plan: Continue current treatment plan.  Check testosterone level and treat accordingly.  [name] 02/04/2011, 12:52 PM

## 2011-02-04 NOTE — Progress Notes (Signed)
[facility] Group Notes:  (Counselor/Nursing/MHT/Case Management/Adjunct)  02/04/2011 10:55 AM  Type of Therapy:  Group Therapy  Participation Level:  Minimal  Participation Quality:  Attentive  Affect:  Flat  Cognitive:  Appropriate and Oriented  Insight:  Limited  Engagement in Group:  Limited  Engagement in Therapy:  Limited  Modes of Intervention:  Problem-solving, Support and exploration  Summary of Progress/Problems: Pt was quiet yet attentive. Pt was able to share that his goal in being in the hospital is to regulate his meds and to appreciate his family more.   [name] 02/04/2011, 10:55 AM

## 2011-02-04 NOTE — Progress Notes (Signed)
Patient ID: Alex Underwood, male   DOB: 1945/06/10, 65 y.o.   MRN: [id number] Patient has been quiet on the unit. Pleasant and cooperative. Denies SI. Stated he is doing better. Compliant with medications.

## 2011-02-04 NOTE — Progress Notes (Signed)
Patient ID: Alex Underwood, male   DOB: Oct 04, 1945, 65 y.o.   MRN: [id number]  Alex Underwood is a 65 y.o. male [id number] [birth year]  Subjective/Objective: Patient seen today he is now taking Effexor 150 mg daily. He's been tolerating the medication and reported no side effects. He does have insomnia but did not took any medication for insomnia. Patient has been involved in daily groups and liked to participate in these groups. He continued to endorse his depression 7 with hopeless and helpless feeling continued to endorse anxiety but hoping Effexor will help. He still had vague suicidal thoughts but there are no plans or meets. He denies any homicidal thoughts. There are no psychotic symptoms present. He described his mood is very anxious and his affect was constricted. I review his current medication, he like to continue his current medication at this time.  Assessment;  Major depressive disorder  Plan Continue current medication, continue to encourage to participate in group therapy.   Filed Vitals:   02/04/11 0731  BP: 132/73  Pulse: 88  Temp:   Resp: 18    Lab Results:   BMET    Component Value Date/Time   NA 136 01/30/2011 1117   K 3.9 01/30/2011 1117   CL 99 01/30/2011 1117   CO2 26 01/30/2011 1117   GLUCOSE 122* 01/30/2011 1117   BUN 12 01/30/2011 1117   CREATININE 0.95 01/30/2011 1117   CALCIUM 9.3 01/30/2011 1117   GFRNONAA 85* 01/30/2011 1117   GFRAA >90 01/30/2011 1117    Medications:  Scheduled:     . aspirin EC  81 mg Oral Daily  . multivitamins ther. w/minerals  1 tablet Oral Daily  . OLANZapine  5 mg Oral QHS  . polyethylene glycol  17 g Oral QHS  . thiamine  100 mg Intramuscular Once  . thiamine  100 mg Oral Daily  . venlafaxine  150 mg Oral Daily  . zolpidem  10 mg Oral QHS  . DISCONTD: venlafaxine  75 mg Oral Daily     PRN Meds acetaminophen, ibuprofen, ondansetron

## 2011-02-04 NOTE — Progress Notes (Signed)
Patient asleep in bed, resting with eyes closed. Appears in no distress. Respirations even, normal and unlabored. Continue monitoring q15minutes to ensure patient safety on the unit. 

## 2011-02-05 NOTE — Progress Notes (Signed)
Patient asleep in bed, resting with eyes closed. Appears in no distress. Respirations even, normal and unlabored. Will continue monitoring patient q15 minutes. 

## 2011-02-05 NOTE — Progress Notes (Signed)
Patient ID: Alex Underwood, male   DOB: 08-31-1945, 65 y.o.   MRN: [id number] Pt is awake and active on the unit this AM. Pt denies SI/HI and is cooperative with staff. Pt is attending meals and groups. Pt states that he slept well and no complaints. Writer will continue to monitor.

## 2011-02-05 NOTE — Progress Notes (Signed)
Patient ID: Alex Underwood, male   DOB: 1946/02/05, 65 y.o.   MRN: [id number]  Alex Underwood is a 65 y.o. male [id number] [birth year]  Subjective/Objective: Patient seen today. He continues to show improvement. He is tolerating his medication and reported no side effects. He likes Effexor. Her sleep is also improved. He is more involved in groups. He denies any auditory hallucinations, suicidal thoughts or homicidal . There are no psychotic symptoms. He described his mood is improved and his affect is also better. He is alert and oriented x3. Assessment Depressive disorder Plan We will continue his current medication. We'll continue to monitor his progress. We will start discharge planning the  Filed Vitals:   02/05/11 0630  BP: 129/77  Pulse: 76  Temp: 98.8 F (37.1 C)  Resp:     Lab Results:   BMET    Component Value Date/Time   NA 136 01/30/2011 1117   K 3.9 01/30/2011 1117   CL 99 01/30/2011 1117   CO2 26 01/30/2011 1117   GLUCOSE 122* 01/30/2011 1117   BUN 12 01/30/2011 1117   CREATININE 0.95 01/30/2011 1117   CALCIUM 9.3 01/30/2011 1117   GFRNONAA 85* 01/30/2011 1117   GFRAA >90 01/30/2011 1117    Medications:  Scheduled:     . aspirin EC  81 mg Oral Daily  . multivitamins ther. w/minerals  1 tablet Oral Daily  . OLANZapine  5 mg Oral QHS  . polyethylene glycol  17 g Oral QHS  . thiamine  100 mg Intramuscular Once  . thiamine  100 mg Oral Daily  . venlafaxine  150 mg Oral Daily  . zolpidem  10 mg Oral QHS     PRN Meds acetaminophen, ibuprofen, ondansetron

## 2011-02-05 NOTE — Progress Notes (Signed)
Patient seen during discharge planning group.  He continues to endorse off/on SI.  He rates his depression, anxiety, hopelessness and helplessness at six. Suicide prevention education reviewed.

## 2011-02-06 NOTE — Progress Notes (Signed)
Alex Underwood was seen by Clinical research associate shortly after evening group, pt reported that he has had a good day, denies having pain, -si/hi/a/v hall. Pt was informed that there were no changes to his hs medications and pt was agreeable to taking them around 2130/2145. Pt is not easily engaged in conversation but did report that his medications seem to be working for him. Pt was supported and encouraged, Clinical research associate assisted pt with setting up his c-pap. Safety maintained on unit will continue to monitor.

## 2011-02-06 NOTE — Progress Notes (Signed)
[facility] Group Notes:  (Counselor/Nursing/MHT/Case Management/Adjunct)  02/06/2011 10:17 AM  Pt. attended after care planning group and was given [facility] SI pamphlet with SI prevention information, crisis and hot line numbers. Pt. agreed to use the crisis number or call 911 if needed. Pt. Spoke about going to live with his daughter in [place] after d/c. The pt. Spoke about his depression and stated the doctor have stated him on new medication. Alex Underwood  02/06/2011, 10:17 AM

## 2011-02-06 NOTE — Progress Notes (Signed)
PT SEEN BY WRITER WHO REPORTED HAVING HAD A GOOD DAY. PT WAS IN THE DAYROOM HAVING SNACK AND WRITER MET 1:1 WITH PT AND INFORMED HIM OF HIS HS SCHEDULED MEDICATIONS. PT WAS AGREEABLE TO TAKING THEM AND WRITER ASSISTED PT WITH SETTING UP HIS C-PAP MACHINE BEFORE TAKING HIS MEDS AT 2130/2145 WHICH HE REQUESTED. PT. DENIED HAVING PAIN, -SI/HI/A/V HALL. PT. REPORTED THAT HIS DAUGHTER AND SON IN [place] VISITED ON TODAY AND WAS GLAD TO SEE THEM. SAFETY MAINTAINED  ON UNIT WILL CONTINUE TO MONITOR.

## 2011-02-06 NOTE — Progress Notes (Signed)
Patient ID: Alex Underwood, male   DOB: 07-24-1945, 65 y.o.   MRN: [id number] 02/06/2011 1900 Alex Underwood is status quo. He remains acutelysad. He does not intitiate cnversation with anybody on the unit, yet he remains out in the milieu, watching TV and hanging around his peers. He makes brief eye contact first thing this AM and says he is " feelings better". He completed his self inventory and on it he wrote that he still has " off and on" SI, he rated his depression and hopelessness 6/6 and said he wants to start establishing daily goals for himslef. A He is compliant with POC. R Safety is maitnaeidna dn POC includes maintenance of theapeutic realtionship PDRN BC

## 2011-02-06 NOTE — Progress Notes (Signed)
[facility] Group Notes:  (Counselor/Nursing/MHT/Case Management/Adjunct)  02/06/2011 3:10 PM  Type of Therapy:  Counseling Group  Participation Level:  Active  Participation Quality:  Appropriate and Attentive  Affect:  Appropriate  Cognitive:  Appropriate  Insight:  Good  Engagement in Group:  Good  Engagement in Therapy:  Good  Modes of Intervention:  Clarification, Problem-solving and Support  Summary of Progress/Problems: Pt. Participated in group discussion on self sabotaging behaviors/thoughts and  how they can enable themselves in a positive way. Each pt. Was asked to give an example of a positive way they can enable themselves and negative enabling thoughts were also discussed also. Pt. Stated that his sabotaging behaviors are around him "over thinking everything". Pt. Spoke about changes he needed to make and looking at things more positively.   Lamar Blinks [place] 02/06/2011, 3:10 PM

## 2011-02-06 NOTE — Progress Notes (Signed)
Patient ID: Alex Underwood, male   DOB: 10-30-1945, 65 y.o.   MRN: [id number]  Alex Underwood is a 65 y.o. male [id number] [birth year]  01/30/2011 Principal Problem:  *Major depressive disorder, recurrent Active Problems:  Generalized anxiety disorder  Alcohol abuse  Subjective/Objective: Reports feeling better on the Effexor. Still reports passive SI at a scale of 5-6.Wife is at the rented condo at [facility] and patient plans to go to [facility] and other therapy at discharge in [place]. Wife will come to [facility] for Christmas and decide if patient can come to [facility]    Filed Vitals:   02/06/11 0859  BP: 144/82  Pulse: 81  Temp:   Resp:     Lab Results:   BMET    Component Value Date/Time   NA 136 01/30/2011 1117   K 3.9 01/30/2011 1117   CL 99 01/30/2011 1117   CO2 26 01/30/2011 1117   GLUCOSE 122* 01/30/2011 1117   BUN 12 01/30/2011 1117   CREATININE 0.95 01/30/2011 1117   CALCIUM 9.3 01/30/2011 1117   GFRNONAA 85* 01/30/2011 1117   GFRAA >90 01/30/2011 1117    Medications:  Scheduled:     . aspirin EC  81 mg Oral Daily  . multivitamins ther. w/minerals  1 tablet Oral Daily  . OLANZapine  5 mg Oral QHS  . polyethylene glycol  17 g Oral QHS  . thiamine  100 mg Intramuscular Once  . thiamine  100 mg Oral Daily  . venlafaxine  150 mg Oral Daily  . zolpidem  10 mg Oral QHS     PRN Meds acetaminophen, ibuprofen, ondansetron  A/P: Depression and anxiety-improved with Effexor          No alcohol since admission 11/17.

## 2011-02-07 NOTE — Progress Notes (Signed)
Pt. attended and participated in aftercare planning group. [facility] support group information was given as well as information on suicide prevention information, warning signs to look for with suicide and crisis line numbers to use. 

## 2011-02-07 NOTE — Progress Notes (Signed)
[facility] Group Notes:  (Counselor/Nursing/MHT/Case Management/Adjunct)  02/07/2011 1315PM  Type of Therapy:  Psychoeducational Skills  Participation Level:  Active  Participation Quality:  Appropriate, Sharing and Supportive  Affect:  Appropriate  Cognitive:  Appropriate  Insight:  Good  Engagement in Group:  Good  Engagement in Therapy:  Good  Modes of Intervention:  Activity, Education, Problem-solving and Support  Summary of Progress/Problems: Pt. participated in group session on supports. Pt. was asked what support means to them, who are their supports, what is the difference between unhealthy and healthy supports and what they can do when their support is not there. Pt. Stated that he considers his wife as a support as well as his daughter. Pt. Stated that he will support himself by replacing his negative thoughts with positive thoughts.   Alex Underwood 02/07/2011, 2:33 PM

## 2011-02-07 NOTE — Progress Notes (Addendum)
Patient ID: Alex Underwood, male   DOB: 07-28-1945, 65 y.o.   MRN: [id number]  Alex Underwood is a 65 y.o. male [id number] [birth year]  01/30/2011 Principal Problem:  *Major depressive disorder, recurrent Active Problems:  Generalized anxiety disorder  Alcohol abuse   Subjective/Objective: Slept well. Still having a few random suicidal thoughts. Suggested increasing Effexor but he declined. Reports his hands shook for about an hour yesterday around 1pm. Asks about his testosterone -it is low 121.54    Filed Vitals:   02/07/11 0811  BP: 138/90  Pulse: 106  Temp:   Resp:     Lab Results:   BMET    Component Value Date/Time   NA 136 01/30/2011 1117   K 3.9 01/30/2011 1117   CL 99 01/30/2011 1117   CO2 26 01/30/2011 1117   GLUCOSE 122* 01/30/2011 1117   BUN 12 01/30/2011 1117   CREATININE 0.95 01/30/2011 1117   CALCIUM 9.3 01/30/2011 1117   GFRNONAA 85* 01/30/2011 1117   GFRAA >90 01/30/2011 1117    Medications:  Scheduled:     . aspirin EC  81 mg Oral Daily  . multivitamins ther. w/minerals  1 tablet Oral Daily  . OLANZapine  5 mg Oral QHS  . polyethylene glycol  17 g Oral QHS  . thiamine  100 mg Intramuscular Once  . thiamine  100 mg Oral Daily  . venlafaxine  150 mg Oral Daily  . zolpidem  10 mg Oral QHS     PRN Meds acetaminophen, ibuprofen, ondansetron A/P Depression/Anxiety        Alcohol Abuse         Low testosterone         Consider increasing Effexor and starting testosterone  Mickie Deery Sharnay Cashion PA-C 02/07/2011

## 2011-02-07 NOTE — Progress Notes (Signed)
Patient ID: Alex Underwood, male   DOB: [birth year], 65 y.o.   MRN: [id number] 02/07/2011 1745 D Wheeler remains guarded, falt and blunted, even though one can see tiny improvemnets in his depression. He is making longer periods of eye contact. His tone of voice has more inflection in it today. He has attended his groups and is engaged in the group discussions.  his self inventory and on it he put his depression and hopelessness were 5/5 and that he cont to have " off and on" SI.  A He is compliant with his meds and therapies.  R Safety is maintaiend and POC includes maintenenavce of therapeutic alliance already established. PD RN [facility]

## 2011-02-08 MED ORDER — TESTOSTERONE 5 MG/24HR TD PT24
1.0000 | MEDICATED_PATCH | Freq: Every day | TRANSDERMAL | Status: DC
  Administered 2011-02-08 – 2011-02-10 (×3): 1 via TRANSDERMAL
  Filled 2011-02-08 (×3): qty 1

## 2011-02-08 NOTE — Progress Notes (Signed)
[facility] Group Notes:  (Counselor/Nursing/MHT/Case Management/Adjunct)  02/08/2011 3:03 PM  Type of Therapy:  Group Therapy  Participation Level:  Active  Participation Quality:  Appropriate and Attentive  Affect:  Blunted  Cognitive:  Alert and Appropriate  Insight:  Limited  Engagement in Group:  Limited  Engagement in Therapy:  Limited  Modes of Intervention:  Education, Support and Exploration  Summary of Progress/Problems: Alex Underwood participated in personal wellness survey. He talked about physical wellness as being an area of strength for him because he does a lot of walking and exercise. Alex Underwood did not process his areas of deficit, but was attentive to others' statements as evidenced by nodding along.    [name] 02/08/2011, 3:03 PM

## 2011-02-08 NOTE — Progress Notes (Signed)
Patient ID: Alex Underwood, male   DOB: May 15, 1945, 65 y.o.   MRN: [id number] Pt reports he slept well and has good appetite and low energy.  He rates dedpression a 4 and hoplessness a 4.  He reports thoughts of self harm "off and on"  He contracts for safety.  Says that he plans to practice thae life skills he has learned in groups and use more positive thinking.

## 2011-02-08 NOTE — Progress Notes (Signed)
Pt attended discharge planning group and actively participated.  Pt presents with blunted affect and depressed mood, although pt shows improvement in mood and affect from last week.  Pt ranks depression and anxiety at a 4 today.  Pt denies SI.  Pt reports improved mood and believes it is from a med change/adjustment on last Wednesday.  Pt states his daughter and son-in-law came to visit this weekend and that went well.  Pt has follow up scheduled in [place].  Pt to return home with daughter and has transportation.  Safety planning and suicide prevention discussed.     Alex Underwood, LCSWA 02/08/2011  11:18 AM    Per State Regulation 482.30 This Chart was reviewed for medical necessity with respect to the patient's Admission/Duration of stay.   Carmina Miller  02/08/2011  Next Review Date: 02/11/11

## 2011-02-08 NOTE — Tx Team (Signed)
Interdisciplinary Treatment Plan Update (Adult)  Date:  02/08/2011  Time Reviewed:  10:12 AM   Progress in Treatment: Attending groups: Yes Participating in groups:  Yes Taking medication as prescribed: Yes Tolerating medication:  Yes Family/Significant othe contact made:  Contact made Patient understands diagnosis:  Yes Discussing patient identified problems/goals with staff:  Yes Medical problems stabilized or resolved:  Yes Denies suicidal/homicidal ideation: No, contracts for safety.  Issues/concerns per patient self-inventory:  None identified Other: N/A  New problem(s) identified: None Identified  Reason for Continuation of Hospitalization: Anxiety Depression Medication stabilization Suicidal ideation  Interventions implemented related to continuation of hospitalization: mood stabilization, medication monitoring and adjustment, group therapy and psycho education, safety checks q 15 mins  Additional comments: N/A  Estimated length of stay: 2-3 days  Discharge Plan: Pt has follow up scheduled in [place].    New goal(s): N/A  Review of initial/current patient goals per problem list:   1.  Goal(s): Depression  Met:  No  Target date: by discharge  As evidenced by: Reduce depression from a 10 to a 3. Pt ranks at a 4 today.  2.  Goal (s): Suicidal Ideation  Met:  No  Target date: by discharge  As evidenced by: Eliminate suicidal ideation.   3.  Goal(s): Anxiety  Met:  No  Target date: by discharge  As evidenced by: Reduce anxiety from a 10 to a 3.  Pt ranks at a 4 today.  4.  Goal(s):  Met:  No  Target date: by discharge  As evidenced by:   Attendees: Patient:     Family:     Physician:  Orson Aloe, MD  02/08/2011  10:12 AM   Nursing:   Neill Loft, RN 02/08/2011 10:23 AM   Case Manager:  Reyes Ivan, LCSWA 02/08/2011  10:12 AM   Counselor:  Angus Palms, LCSW 02/08/2011  10:12 AM   Other:  Harl Favor, counseling intern  02/08/2011  10:12 AM   Other:  Quintella Reichert, RN 02/08/2011 10:23 AM   Other:     Other:      Scribe for Treatment Team:   Carmina Miller, 02/08/2011 , 10:12 AM

## 2011-02-08 NOTE — Progress Notes (Signed)
[facility] Group Notes:  (Counselor/Nursing/MHT/Case Management/Adjunct)  02/08/2011 2:08 PM  Type of Therapy:  Group counseling  Participation Level:  Minimal  Participation Quality:  Attentive  Affect:  Flat  Cognitive:  Alert  Insight:  Limited  Engagement in Group:  Limited  Engagement in Therapy:  Limited  Modes of Intervention:  Support and Process  Summary of Progress/Problems: Alex Underwood identified having a routine daily schedule that involves healthy balanced meals routinely as well a daily exercise as important components to wellness. However Alex Underwood has not been straightforward about what steps he can take in order to get to a space of wellness. He has been journaling during groups which is definitely a positive step forward for Alex Underwood. He is becoming more open and willing to share although he continues to have a flat affect.   Alex Underwood 02/08/2011, 2:08 PM  Cosigned by: Angus Palms, LCSW

## 2011-02-08 NOTE — Progress Notes (Signed)
Patient ID: Alex Underwood, male   DOB: 08/10/1945, 65 y.o.   MRN: [id number] Patient reports that the nurse gave him the report on his Testosterone level.  It is low at 121.54.  He had had a level drawn in January and that was at 300.  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  He can not have traditional penile vaginal sex.  This could explain some of the loss of testosterone.  He chooses to try the replacement.  WIll try the 5mg /24hr patch.  He is looking forward to that helping his depression.  I also mentioned to him the 3 inch long steel implant that squirts out low dose testosterone at one time during the day and a higher dose at another time during the day and that the implant gives a 3 years supply of testosterone.   I have informed him ot follow up with his family doctor for this and his very slightly low T3.  He reports starting the Effexor on Wed of last week 5 days ago.  He noted some hand shaking for a few moments yesterday.  He was wondering if that was a side effect of the meds.  I told him that I have not heard that to be a real common side effect, but we will wait and observe.  He was okay with that.  He notes that his mood had started out at an 8 or 7 and now it down to a 4.  He is inquiring about going home soon.  Will observe tomorrow and see.  He reports that the stool softener is working.

## 2011-02-09 NOTE — Progress Notes (Signed)
[facility] Group Notes:  (Counselor/Nursing/MHT/Case Management/Adjunct)  02/09/2011 12:44 PM  Type of Therapy:  Group Counseling  Participation Level:  Minimal  Participation Quality:  Attentive  Affect:  Flat  Cognitive:  Appropriate  Insight:  Limited  Engagement in Group:  Limited  Engagement in Therapy:  Limited  Modes of Intervention:  Clarification and Support  Summary of Progress/Problems: Alex Underwood identified himself as a caring and giving person however thoughts related to diagnosis present feelings of depression and isolation.  He appeared to understand the relationship to how others impact our beliefs about ourselves.  He did not actively engage in further group discussion   Alex Underwood 02/09/2011, 12:44 PM  Cosigned by: Angus Palms, LCSW

## 2011-02-09 NOTE — Progress Notes (Signed)
Pt reports he is doing better and this afternoon "felt like his old self".  He denies SI/HI at this time.  He is hopeful that having the Androderm patch will help his depression decrease so that he can function at his baseline and go back to doing the things he enjoys.  He voices no complaints/needs at this time.  Safety maintained with q15 minute checks.

## 2011-02-09 NOTE — Progress Notes (Signed)
Patient ID: Alex Underwood, male   DOB: 03/01/1946, 65 y.o.   MRN: [id number] Pt reports sleeping well and his appetite is good. He endorses thoughts of self harm off and on over the lat 24 hours.  He reported a nose bleed earlier, probably from dry air in building.  He says his depression is a 3 and his hopelessness a 4.  He says he plans to stop drinking beer.  He is pleased that he was started on testosterone last night and he thinks the Effexor is helping his depression.

## 2011-02-09 NOTE — Progress Notes (Signed)
Pt. Pleasant and cooperative.  Reports that he is "feeling pretty good today."   No issues or concerns voiced.

## 2011-02-09 NOTE — Progress Notes (Signed)
Pt attended discharge planning group and actively participated.  Pt presents with blunted affect and depressed mood.  Pt ranks depression and anxiety at a 3-4.  Pt denies SI today.  Pt discussed feeling less depressed and believes he is making good progress.  Pt states the dr tested his testosterone level and found it to be low.  Pt states that the dr started him on a patch and this could also help improve his mood.  Pt asked if this is something that would continue after d/c as he is pleased with this finding and resolution.  Pt's d/c plan continues to be the same - return home with daughter and f/u with a psychiatrist and therapist in [place].  Safety planning and suicide prevention discussed.     Reyes Ivan, LCSWA 02/09/2011  9:44 AM

## 2011-02-09 NOTE — Progress Notes (Signed)
Pt reports his day has been fine.  He is hopeful that the discovery of his low testosterone level and initiating the Androgel patch will improve his depression and help him get back to a normal life.  The pt seemed excited that his depression could be helped by the patch.  His plan is to stay with his daughter in [place] after discharge for a while until he feels he can return to [place].  Gave pt support and encouragement.  Safety maintained with q15 minute checks.

## 2011-02-09 NOTE — Progress Notes (Signed)
Patient was present for grief and loss group in which the patients took time to name their individual losses, discussed the unique grieving process, and identified ways to connect with the feelings (as opposed to avoiding them) and ways in which to rebuild their lives after loss.   Patient reported the loss of his best friend, which resulted in the loss of security and an important social support.  Addison Naegeli, LPCA Theda Belfast, M. Div., [facility]

## 2011-02-09 NOTE — Progress Notes (Signed)
Patient ID: Alex Underwood, male   DOB: 1945-09-25, 65 y.o.   MRN: [id number] Pt reports that he slept pretty well last night.  He is relieved that the low testosterone has been identified and is very encouraged by having the testosterone patch placed on him.  He is asking how he could taper down from the Zyprexa that he is on.   We discussed the possibility of him cutting the Zyprexa in half and coordinating that with Dr Claudie Fisherman and then maybe going with the 2.5mg  tab of it and then cutting that in half.  He seems quite informed about his meds and his health.   Seems ready for D/C soon.   Mood is 3 or 4 today.  No more thoughts of suicide.  Those are starting to diminish significantly. Anxiety is 3 or 4 as well.  He is very encouraged by the testosterone patch.  It is a big relief that the problem is identified and then action taken to rectify that.

## 2011-02-10 MED ORDER — TESTOSTERONE 5 MG/24HR TD PT24: 1.0000 | MEDICATED_PATCH | Freq: Every day | TRANSDERMAL | Status: DC

## 2011-02-10 MED ORDER — VENLAFAXINE HCL ER 150 MG PO CP24: 150.0000 mg | ORAL_CAPSULE | Freq: Every day | ORAL | Status: DC

## 2011-02-10 NOTE — Discharge Summary (Addendum)
Discharge Note  Patient:  Alex Underwood is an 65 y.o., male DOB:  [birth year]  Date of Admission:  01/30/2011  Date of Discharge:  02/10/2011  Level of Care:  OP  Discharge destination:  Home  Is patient on multiple antipsychotic therapies at discharge:  No    Patient phone:  [phone] (home)  Patient address:   [address] [place] [address],   The patient received suicide prevention pamphlet:  Yes Belongings returned:  Judene Companion, Fabian Walder 02/10/2011, 8:31 PM

## 2011-02-10 NOTE — Progress Notes (Signed)
Suicide Risk Assessment  Discharge Assessment     Demographic factors:    Current Mental Status:    Risk Reduction Factors:     CLINICAL FACTORS:   Severe Anxiety and/or Agitation Depression:   Hopelessness Previous Psychiatric Diagnoses and Treatments Medical Diagnoses and Treatments/Surgeries  COGNITIVE FEATURES THAT CONTRIBUTE TO RISK:  No cognitive risk factors noted    SUICIDE RISK:   Minimal: No identifiable suicidal ideation.  Patients presenting with no risk factors but with morbid ruminations; may be classified as minimal risk based on the severity of the depressive symptoms  Patient denies suicidal or homicidal ideation, hallucinations, illusions, or delusions. Patient engages with good eye contact, is able to focus adequately in a one to one setting, and has clear goal directed thoughts. Patient speaks with a natural conversational volume, rate, and tone. Anxiety was reported at 3 or 4 on a scale of 1 the least and 10 the most. Depression was reported at 1 on the same scale. Patient is oriented times 4, recent and remote memory intact. Judgement: Improved from admission regarding assessment of his life and how to conduct it better Insight: Has learned to not isolate himself, to use coping skills, review what he has written down in his journal and really study his coping skills and use them.  He is very pleased with identifying the low testosterone.  He feels very confident that that will help him get back on his feet. Plan: Go home to his daughter's house where his grandson is studying to be a Engineer, civil (consulting).  Follow-up with Dr Imogene Burn in [place], go to a [facility] counseling center in [place].  His wife is planning to come up to [facility] and perhaps take him back to their winter home in [place] if his health allows.  If not then his grandson will transport him later.   Alex Underwood 02/10/2011, 8:22 PM

## 2011-02-10 NOTE — Progress Notes (Signed)
Spoke with pt before group.  Plans are for his discharge tomorrow to his daughter's house for a while before returning to [place].  He is still "up-beat" about his progress and glad he is feeling better.  He says his depression is lifting and is encouraged that his meds seem to be working.  Pt voiced no needs/concerns at this time.  Safety maintained with q15 minute checks.

## 2011-02-10 NOTE — Progress Notes (Signed)
[facility] Group Notes:  (Counselor/Nursing/MHT/Case Management/Adjunct)  02/10/2011 1:17 PM  Type of Therapy:  Group Therapy  Participation Level:  Minimal  Participation Quality:  Appropriate and Attentive  Affect:  Blunted  Cognitive:  Alert and Appropriate  Insight:  Limited  Engagement in Group:  Limited  Engagement in Therapy:  Limited  Modes of Intervention:  Support and Exploration  Summary of Progress/Problems: Alex Underwood was attentive and appropriate in group, but did not share personal experiences. He did state that he related to another group member's metaphor of anger as the  fire alarm rather than the source of the fire (a secondary emotion).   [name] 02/10/2011, 1:17 PM

## 2011-02-10 NOTE — Progress Notes (Signed)
Pt attended discharge planning group and actively participated.  Pt presents with calm mood and affect.  SW observed pt to have improved mood today.  Pt states he had a great day yesterday, feeling the best he has thus far.  Pt contributes his improved mood to his medication working.  Pt ranks depression and anxiety at a 3 today.  Pt denies SI.  Pt has follow up in [place].  Pt reports feeling ready to d/c tomorrow.  Pt states his daughter is out of town today and won't be able to pick him up today if he was to be d/c today.  Pt states he will have transportation tomorrow.  Pt appears ready to d/c tomorrow due to improved mood and affect.     Reyes Ivan, LCSWA 02/10/2011  10:31 AM

## 2011-02-10 NOTE — Progress Notes (Signed)
Pt has been attending groups and actively participating. Pt rates depression at a 3 and hopelessness at a 3. Pt has been pleasant and cooperative. Pt was offered support and encouragement. Pt is receptive to treatment and safety maintained on unit.

## 2011-02-10 NOTE — Discharge Summary (Signed)
Discharge Note  Patient:  Alex Underwood is an 65 y.o., male DOB:  07-16-1945  Date of Admission:  01/30/2011  Date of Discharge:  02/11/2011  Axis Diagnosis:   Axis I: Major Depression, Recurrent severe Axis II: Deferred Axis III:  Past Medical History  Diagnosis Date  . Depression     Since 04/12   Axis IV: other psychosocial or environmental problems Axis V: 51-60 moderate symptoms  Hospital Course: Patient was admitted and adjusted to a higher dose of Cymbalta, but he got dry mouth, stuffed nose, and blurry vision and that was stopped.  He was switched to Effexor with excellent results.  He was better able to participate in the programming.  He had his testosterone rechecked and it had fallen from 300 on 2012.01.01 to 121.54.  He was placed on testosterone replacement with good results.  Mainly the improvements noted were the relief that something specific was identified and was being corrected.    Condition on Discharge: Patient denies suicidal or homicidal ideation, hallucinations, illusions, or delusions. Patient engages with good eye contact, is able to focus adequately in a one to one setting, and has clear goal directed thoughts. Patient speaks with a natural conversational volume, rate, and tone. Anxiety was reported at 3 or 4 on a scale of 1 the least and 10 the most. Depression was reported at 1 on the same scale. Patient is oriented times 4, recent and remote memory intact. Judgement: Improved from admission regarding assessment of his life and how to conduct it better Insight: Has learned to not isolate himself, to use coping skills, review what he has written down in his journal and really study his coping skills and use them.  He is very pleased with identifying the low testosterone.  He feels very confident that that will help him get back on his feet. Plan: Go home to his daughter's house where his grandson is studying to be a Engineer, civil (consulting).  Follow-up with Dr Imogene Burn in  [place], go to a christian counseling center in [place].  His wife is planning to come up to [facility] and perhaps take him back to their winter home in [place] if his health allows.  If not then his grandson will transport him later.  Medications:    . aspirin EC  81 mg Oral Daily  . multivitamins ther. w/minerals  1 tablet Oral Daily  . OLANZapine  5 mg Oral QHS  . polyethylene glycol  17 g Oral QHS  . testosterone  1 patch Transdermal Daily  . thiamine  100 mg Intramuscular Once  . thiamine  100 mg Oral Daily  . venlafaxine  150 mg Oral Daily  . zolpidem  10 mg Oral QHS     Merian Wroe 02/10/2011, 8:32 PM

## 2011-02-11 NOTE — Progress Notes (Signed)
Pt in good spirits and feels like a new man. Pt D/C home with daughter. Pt rx and follow up visits were discussed with pt and pt verbalized understanding. Pt denies SI/HI. Pt belongings were returned.

## 2011-02-11 NOTE — Tx Team (Addendum)
Interdisciplinary Treatment Plan Update (Adult)  Date:  02/11/2011  Time Reviewed:  10:45 am   Progress in Treatment: Attending groups: Yes Participating in groups:  Yes Taking medication as prescribed: Yes Tolerating medication:  Yes Family/Significant othe contact made: Yes   Patient understands diagnosis:  Yes Discussing patient identified problems/goals with staff:  Yes Medical problems stabilized or resolved:  Yes Denies suicidal/homicidal ideation: Yes Issues/concerns per patient self-inventory:  None identified Other: N/A  New problem(s) identified: None Identified  Reason for Continuation of Hospitalization: Stable to d/c  Interventions implemented related to continuation of hospitalization: Stable to d/c  Additional comments: N/A  Estimated length of stay: D/C today  Discharge Plan: Pt will follow up with Dr. Imogene Burn for medication management and [facility]  New goal(s): N/A  Review of initial/current patient goals per problem list:   1.  Goal(s): Depression  Met:  Yes  Target date: by discharge  As evidenced by: Reduce depression from a 10 to a 3.  Pt ranks at a 2 today  2.  Goal (s): Suicidal Ideation  Met:  Yes  Target date: by discharge  As evidenced by: Pt denies SI.    3.  Goal(s): Anxiety  Met:  Yes  Target date: by discharge  As evidenced by: Reduce anxiety from a 10 to a 3. Pt ranks at a 2 today.  4.  Goal(s):  Met:  No  Target date: by discharge  As evidenced by:   Attendees: Patient:  Alex Underwood 02/11/2011 10:45 am  Family:     Physician:  Orson Aloe, MD  02/11/2011  10:45 am  Nursing:   Omelia Blackwater, RN 02/11/2011  10:45 am   Case Manager:  Reyes Ivan, LCSWA 02/11/2011  10:45 am  Counselor:  Angus Palms, LCSW 02/11/2011  10:45 am   Other:  Juline Patch, LCSW 02/11/2011  10:45 am   Other:  Waynetta Sandy, RN   Other:  Harl Favor, counseling intern   Other:      Scribe for Treatment  Team:   Carmina Miller, 02/11/2011 , 12:23 PM

## 2011-02-11 NOTE — Discharge Summary (Signed)
Pt attended discharge planning group and actively participated.  Pt presents with bright affect and mood.  SW observed pt to smile and participate more in group than usual.  Pt reports feeling stable to d/c today.  Pt ranks depression and anxiety at a 2 today.  Pt denies SI/HI.  Pt states he looks forward to going home and taking better care of himself.  Pt will follow [facility] for therapy and Dr. Imogene Burn for medication management.  Pt will return home with his daughter and his daughter will provide transportation.  Pt has access to his meds.  Met pt's goals by scheduling follow up in [place] and discussing safety planning and suicide prevention.  No further d/c needs voiced.  Pt stable to d/c.   Alex Underwood, [place] 02/11/2011  12:33 PM

## 2011-02-16 ENCOUNTER — Other Ambulatory Visit (HOSPITAL_COMMUNITY): Payer: Self-pay | Admitting: Psychiatry

## 2011-02-17 ENCOUNTER — Ambulatory Visit (HOSPITAL_COMMUNITY): Payer: Medicare Other | Admitting: Psychiatry

## 2012-03-15 DIAGNOSIS — Z85828 Personal history of other malignant neoplasm of skin: Secondary | ICD-10-CM

## 2012-03-15 HISTORY — DX: Personal history of other malignant neoplasm of skin: Z85.828

## 2013-06-24 DIAGNOSIS — G4700 Insomnia, unspecified: Secondary | ICD-10-CM | POA: Insufficient documentation

## 2013-06-24 DIAGNOSIS — F325 Major depressive disorder, single episode, in full remission: Secondary | ICD-10-CM | POA: Insufficient documentation

## 2013-06-24 DIAGNOSIS — I10 Essential (primary) hypertension: Secondary | ICD-10-CM | POA: Insufficient documentation

## 2013-06-24 DIAGNOSIS — E291 Testicular hypofunction: Secondary | ICD-10-CM | POA: Insufficient documentation

## 2013-06-24 DIAGNOSIS — E7800 Pure hypercholesterolemia, unspecified: Secondary | ICD-10-CM | POA: Insufficient documentation

## 2013-12-27 ENCOUNTER — Ambulatory Visit (INDEPENDENT_AMBULATORY_CARE_PROVIDER_SITE_OTHER): Payer: Medicare Other

## 2013-12-27 ENCOUNTER — Ambulatory Visit (INDEPENDENT_AMBULATORY_CARE_PROVIDER_SITE_OTHER): Payer: Medicare Other | Admitting: Podiatry

## 2013-12-27 ENCOUNTER — Encounter: Payer: Self-pay | Admitting: Podiatry

## 2013-12-27 VITALS — BP 116/66 | HR 57 | Resp 16 | Ht 69.0 in | Wt 235.0 lb

## 2013-12-27 DIAGNOSIS — M722 Plantar fascial fibromatosis: Secondary | ICD-10-CM

## 2013-12-27 NOTE — Progress Notes (Signed)
   Subjective:    Patient ID: PRAJWAL FELLNER, male    DOB: 1945-08-08, 68 y.o.   MRN: [id number]  HPI Comments: Mr. Augustin Schooling, 68 year old male, presents the office they with complaints of left heel pain. He states that he has seen Dr. Cleda Mccreedy previously for which she was diagnosed with something "that starts with a B" approximate one year ago. He states that his pain is been ongoing for approximately 2 years and has been progressive. He does state that he has pain after periods of ambulation. He has been soaking his feet and ice. Denies any recent injury or trauma to the area. No other complaints at this time.  Foot Pain      Review of Systems  All other systems reviewed and are negative.      Objective:   Physical Exam AAO x3, NAD DP/PT pulses palpable bilaterally, CRT less than 3 seconds Protective sensation intact with Simms Weinstein monofilament, vibratory sensation intact, Achilles tendon reflex intact. Negative Tinel sign. Tenderness to palpation of the plantar medial tubercle of the left heel as well as the central aspect of the heel. There is no pain with lateral compression or with vibratory sensation. No pain along the posterior aspect of the calcaneus. No pain along the course of the plantar fascia within the arch of the foot.MMT 5/5, ROM WNL. No overlying edema, erythema, or increased warmth to the area. No calf pain, swelling, warmth. No open lesions or pre-ulcerative lesions.      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 -X-rays were obtained and reviewed with the patient. -Conservative versus surgical treatment discussed including alternatives, risks, complications -Patient elects to proceed with steroid injection into the left heel. Under sterile skin preparation, a total of 2.5cc of kenalog 10, 0.5% Marcaine plain, and 2% lidocaine plain were infiltrated into the symptomatic area without complication. A  band-aid was applied. Patient tolerated the injection well without complication.  -Ice to the affected area -Discussed stretching exercises. -Dispensed plantar fascial brace. -Discussed the importance of supportive shoe gear and possible orthotics. -Followup in 3 weeks or sooner if any problems are to arise or any changes symptoms. In the meantime call the office with any questions, concerns.

## 2013-12-27 NOTE — Patient Instructions (Signed)
Plantar Fasciitis (Heel Spur Syndrome) with Rehab The plantar fascia is a fibrous, ligament-like, soft-tissue structure that spans the bottom of the foot. Plantar fasciitis is a condition that causes pain in the foot due to inflammation of the tissue. SYMPTOMS   Pain and tenderness on the underneath side of the foot.  Pain that worsens with standing or walking. CAUSES  Plantar fasciitis is caused by irritation and injury to the plantar fascia on the underneath side of the foot. Common mechanisms of injury include:  Direct trauma to bottom of the foot.  Damage to a small nerve that runs under the foot where the main fascia attaches to the heel bone.  Stress placed on the plantar fascia due to bone spurs. RISK INCREASES WITH:   Activities that place stress on the plantar fascia (running, jumping, pivoting, or cutting).  Poor strength and flexibility.  Improperly fitted shoes.  Tight calf muscles.  Flat feet.  Failure to warm-up properly before activity.  Obesity. PREVENTION  Warm up and stretch properly before activity.  Allow for adequate recovery between workouts.  Maintain physical fitness:  Strength, flexibility, and endurance.  Cardiovascular fitness.  Maintain a health body weight.  Avoid stress on the plantar fascia.  Wear properly fitted shoes, including arch supports for individuals who have flat feet. PROGNOSIS  If treated properly, then the symptoms of plantar fasciitis usually resolve without surgery. However, occasionally surgery is necessary. RELATED COMPLICATIONS   Recurrent symptoms that may result in a chronic condition.  Problems of the lower back that are caused by compensating for the injury, such as limping.  Pain or weakness of the foot during push-off following surgery.  Chronic inflammation, scarring, and partial or complete fascia tear, occurring more often from repeated injections. TREATMENT  Treatment initially involves the use of  ice and medication to help reduce pain and inflammation. The use of strengthening and stretching exercises may help reduce pain with activity, especially stretches of the Achilles tendon. These exercises may be performed at home or with a therapist. Your caregiver may recommend that you use heel cups of arch supports to help reduce stress on the plantar fascia. Occasionally, corticosteroid injections are given to reduce inflammation. If symptoms persist for greater than 6 months despite non-surgical (conservative), then surgery may be recommended.  MEDICATION   If pain medication is necessary, then nonsteroidal anti-inflammatory medications, such as aspirin and ibuprofen, or other minor pain relievers, such as acetaminophen, are often recommended.  Do not take pain medication within 7 days before surgery.  Prescription pain relievers may be given if deemed necessary by your caregiver. Use only as directed and only as much as you need.  Corticosteroid injections may be given by your caregiver. These injections should be reserved for the most serious cases, because they may only be given a certain number of times. HEAT AND COLD  Cold treatment (icing) relieves pain and reduces inflammation. Cold treatment should be applied for 10 to 15 minutes every 2 to 3 hours for inflammation and pain and immediately after any activity that aggravates your symptoms. Use ice packs or massage the area with a piece of ice (ice massage).  Heat treatment may be used prior to performing the stretching and strengthening activities prescribed by your caregiver, physical therapist, or athletic trainer. Use a heat pack or soak the injury in warm water. SEEK IMMEDIATE MEDICAL CARE IF:  Treatment seems to offer no benefit, or the condition worsens.  Any medications produce adverse side effects. EXERCISES RANGE   OF MOTION (ROM) AND STRETCHING EXERCISES - Plantar Fasciitis (Heel Spur Syndrome) These exercises may help you  when beginning to rehabilitate your injury. Your symptoms may resolve with or without further involvement from your physician, physical therapist or athletic trainer. While completing these exercises, remember:   Restoring tissue flexibility helps normal motion to return to the joints. This allows healthier, less painful movement and activity.  An effective stretch should be held for at least 30 seconds.  A stretch should never be painful. You should only feel a gentle lengthening or release in the stretched tissue. RANGE OF MOTION - Toe Extension, Flexion  Sit with your right / left leg crossed over your opposite knee.  Grasp your toes and gently pull them back toward the top of your foot. You should feel a stretch on the bottom of your toes and/or foot.  Hold this stretch for __________ seconds.  Now, gently pull your toes toward the bottom of your foot. You should feel a stretch on the top of your toes and or foot.  Hold this stretch for __________ seconds. Repeat __________ times. Complete this stretch __________ times per day.  RANGE OF MOTION - Ankle Dorsiflexion, Active Assisted  Remove shoes and sit on a chair that is preferably not on a carpeted surface.  Place right / left foot under knee. Extend your opposite leg for support.  Keeping your heel down, slide your right / left foot back toward the chair until you feel a stretch at your ankle or calf. If you do not feel a stretch, slide your bottom forward to the edge of the chair, while still keeping your heel down.  Hold this stretch for __________ seconds. Repeat __________ times. Complete this stretch __________ times per day.  STRETCH - Gastroc, Standing  Place hands on wall.  Extend right / left leg, keeping the front knee somewhat bent.  Slightly point your toes inward on your back foot.  Keeping your right / left heel on the floor and your knee straight, shift your weight toward the wall, not allowing your back to  arch.  You should feel a gentle stretch in the right / left calf. Hold this position for __________ seconds. Repeat __________ times. Complete this stretch __________ times per day. STRETCH - Soleus, Standing  Place hands on wall.  Extend right / left leg, keeping the other knee somewhat bent.  Slightly point your toes inward on your back foot.  Keep your right / left heel on the floor, bend your back knee, and slightly shift your weight over the back leg so that you feel a gentle stretch deep in your back calf.  Hold this position for __________ seconds. Repeat __________ times. Complete this stretch __________ times per day. STRETCH - Gastrocsoleus, Standing  Note: This exercise can place a lot of stress on your foot and ankle. Please complete this exercise only if specifically instructed by your caregiver.   Place the ball of your right / left foot on a step, keeping your other foot firmly on the same step.  Hold on to the wall or a rail for balance.  Slowly lift your other foot, allowing your body weight to press your heel down over the edge of the step.  You should feel a stretch in your right / left calf.  Hold this position for __________ seconds.  Repeat this exercise with a slight bend in your right / left knee. Repeat __________ times. Complete this stretch __________ times per day.    STRENGTHENING EXERCISES - Plantar Fasciitis (Heel Spur Syndrome)  These exercises may help you when beginning to rehabilitate your injury. They may resolve your symptoms with or without further involvement from your physician, physical therapist or athletic trainer. While completing these exercises, remember:   Muscles can gain both the endurance and the strength needed for everyday activities through controlled exercises.  Complete these exercises as instructed by your physician, physical therapist or athletic trainer. Progress the resistance and repetitions only as guided. STRENGTH -  Towel Curls  Sit in a chair positioned on a non-carpeted surface.  Place your foot on a towel, keeping your heel on the floor.  Pull the towel toward your heel by only curling your toes. Keep your heel on the floor.  If instructed by your physician, physical therapist or athletic trainer, add ____________________ at the end of the towel. Repeat __________ times. Complete this exercise __________ times per day. STRENGTH - Ankle Inversion  Secure one end of a rubber exercise band/tubing to a fixed object (table, pole). Loop the other end around your foot just before your toes.  Place your fists between your knees. This will focus your strengthening at your ankle.  Slowly, pull your big toe up and in, making sure the band/tubing is positioned to resist the entire motion.  Hold this position for __________ seconds.  Have your muscles resist the band/tubing as it slowly pulls your foot back to the starting position. Repeat __________ times. Complete this exercises __________ times per day.  Document Released: 03/01/2005 Document Revised: 05/24/2011 Document Reviewed: 06/13/2008 [employer] Patient Information 2015 [employer]. This information is not intended to replace advice given to you by your health care provider. Make sure you discuss any questions you have with your health care provider.  

## 2013-12-30 ENCOUNTER — Encounter: Payer: Self-pay | Admitting: Podiatry

## 2014-01-17 ENCOUNTER — Ambulatory Visit (INDEPENDENT_AMBULATORY_CARE_PROVIDER_SITE_OTHER): Payer: Medicare Other | Admitting: Podiatry

## 2014-01-17 VITALS — BP 110/59 | HR 65 | Resp 16

## 2014-01-17 DIAGNOSIS — M722 Plantar fascial fibromatosis: Secondary | ICD-10-CM

## 2014-01-17 MED ORDER — TRIAMCINOLONE ACETONIDE 0.1 % EX CREA: 1.0000 "application " | TOPICAL_CREAM | Freq: Two times a day (BID) | CUTANEOUS | Status: DC

## 2014-01-17 MED ORDER — TRIAMCINOLONE ACETONIDE 10 MG/ML IJ SUSP
10.0000 mg | Freq: Once | INTRAMUSCULAR | Status: AC
  Administered 2014-01-17: 10 mg

## 2014-01-17 NOTE — Patient Instructions (Signed)
Plantar Fasciitis (Heel Spur Syndrome) with Rehab The plantar fascia is a fibrous, ligament-like, soft-tissue structure that spans the bottom of the foot. Plantar fasciitis is a condition that causes pain in the foot due to inflammation of the tissue. SYMPTOMS   Pain and tenderness on the underneath side of the foot.  Pain that worsens with standing or walking. CAUSES  Plantar fasciitis is caused by irritation and injury to the plantar fascia on the underneath side of the foot. Common mechanisms of injury include:  Direct trauma to bottom of the foot.  Damage to a small nerve that runs under the foot where the main fascia attaches to the heel bone.  Stress placed on the plantar fascia due to bone spurs. RISK INCREASES WITH:   Activities that place stress on the plantar fascia (running, jumping, pivoting, or cutting).  Poor strength and flexibility.  Improperly fitted shoes.  Tight calf muscles.  Flat feet.  Failure to warm-up properly before activity.  Obesity. PREVENTION  Warm up and stretch properly before activity.  Allow for adequate recovery between workouts.  Maintain physical fitness:  Strength, flexibility, and endurance.  Cardiovascular fitness.  Maintain a health body weight.  Avoid stress on the plantar fascia.  Wear properly fitted shoes, including arch supports for individuals who have flat feet. PROGNOSIS  If treated properly, then the symptoms of plantar fasciitis usually resolve without surgery. However, occasionally surgery is necessary. RELATED COMPLICATIONS   Recurrent symptoms that may result in a chronic condition.  Problems of the lower back that are caused by compensating for the injury, such as limping.  Pain or weakness of the foot during push-off following surgery.  Chronic inflammation, scarring, and partial or complete fascia tear, occurring more often from repeated injections. TREATMENT  Treatment initially involves the use of  ice and medication to help reduce pain and inflammation. The use of strengthening and stretching exercises may help reduce pain with activity, especially stretches of the Achilles tendon. These exercises may be performed at home or with a therapist. Your caregiver may recommend that you use heel cups of arch supports to help reduce stress on the plantar fascia. Occasionally, corticosteroid injections are given to reduce inflammation. If symptoms persist for greater than 6 months despite non-surgical (conservative), then surgery may be recommended.  MEDICATION   If pain medication is necessary, then nonsteroidal anti-inflammatory medications, such as aspirin and ibuprofen, or other minor pain relievers, such as acetaminophen, are often recommended.  Do not take pain medication within 7 days before surgery.  Prescription pain relievers may be given if deemed necessary by your caregiver. Use only as directed and only as much as you need.  Corticosteroid injections may be given by your caregiver. These injections should be reserved for the most serious cases, because they may only be given a certain number of times. HEAT AND COLD  Cold treatment (icing) relieves pain and reduces inflammation. Cold treatment should be applied for 10 to 15 minutes every 2 to 3 hours for inflammation and pain and immediately after any activity that aggravates your symptoms. Use ice packs or massage the area with a piece of ice (ice massage).  Heat treatment may be used prior to performing the stretching and strengthening activities prescribed by your caregiver, physical therapist, or athletic trainer. Use a heat pack or soak the injury in warm water. SEEK IMMEDIATE MEDICAL CARE IF:  Treatment seems to offer no benefit, or the condition worsens.  Any medications produce adverse side effects. EXERCISES RANGE   OF MOTION (ROM) AND STRETCHING EXERCISES - Plantar Fasciitis (Heel Spur Syndrome) These exercises may help you  when beginning to rehabilitate your injury. Your symptoms may resolve with or without further involvement from your physician, physical therapist or athletic trainer. While completing these exercises, remember:   Restoring tissue flexibility helps normal motion to return to the joints. This allows healthier, less painful movement and activity.  An effective stretch should be held for at least 30 seconds.  A stretch should never be painful. You should only feel a gentle lengthening or release in the stretched tissue. RANGE OF MOTION - Toe Extension, Flexion  Sit with your right / left leg crossed over your opposite knee.  Grasp your toes and gently pull them back toward the top of your foot. You should feel a stretch on the bottom of your toes and/or foot.  Hold this stretch for __________ seconds.  Now, gently pull your toes toward the bottom of your foot. You should feel a stretch on the top of your toes and or foot.  Hold this stretch for __________ seconds. Repeat __________ times. Complete this stretch __________ times per day.  RANGE OF MOTION - Ankle Dorsiflexion, Active Assisted  Remove shoes and sit on a chair that is preferably not on a carpeted surface.  Place right / left foot under knee. Extend your opposite leg for support.  Keeping your heel down, slide your right / left foot back toward the chair until you feel a stretch at your ankle or calf. If you do not feel a stretch, slide your bottom forward to the edge of the chair, while still keeping your heel down.  Hold this stretch for __________ seconds. Repeat __________ times. Complete this stretch __________ times per day.  STRETCH - Gastroc, Standing  Place hands on wall.  Extend right / left leg, keeping the front knee somewhat bent.  Slightly point your toes inward on your back foot.  Keeping your right / left heel on the floor and your knee straight, shift your weight toward the wall, not allowing your back to  arch.  You should feel a gentle stretch in the right / left calf. Hold this position for __________ seconds. Repeat __________ times. Complete this stretch __________ times per day. STRETCH - Soleus, Standing  Place hands on wall.  Extend right / left leg, keeping the other knee somewhat bent.  Slightly point your toes inward on your back foot.  Keep your right / left heel on the floor, bend your back knee, and slightly shift your weight over the back leg so that you feel a gentle stretch deep in your back calf.  Hold this position for __________ seconds. Repeat __________ times. Complete this stretch __________ times per day. STRETCH - Gastrocsoleus, Standing  Note: This exercise can place a lot of stress on your foot and ankle. Please complete this exercise only if specifically instructed by your caregiver.   Place the ball of your right / left foot on a step, keeping your other foot firmly on the same step.  Hold on to the wall or a rail for balance.  Slowly lift your other foot, allowing your body weight to press your heel down over the edge of the step.  You should feel a stretch in your right / left calf.  Hold this position for __________ seconds.  Repeat this exercise with a slight bend in your right / left knee. Repeat __________ times. Complete this stretch __________ times per day.    STRENGTHENING EXERCISES - Plantar Fasciitis (Heel Spur Syndrome)  These exercises may help you when beginning to rehabilitate your injury. They may resolve your symptoms with or without further involvement from your physician, physical therapist or athletic trainer. While completing these exercises, remember:   Muscles can gain both the endurance and the strength needed for everyday activities through controlled exercises.  Complete these exercises as instructed by your physician, physical therapist or athletic trainer. Progress the resistance and repetitions only as guided. STRENGTH -  Towel Curls  Sit in a chair positioned on a non-carpeted surface.  Place your foot on a towel, keeping your heel on the floor.  Pull the towel toward your heel by only curling your toes. Keep your heel on the floor.  If instructed by your physician, physical therapist or athletic trainer, add ____________________ at the end of the towel. Repeat __________ times. Complete this exercise __________ times per day. STRENGTH - Ankle Inversion  Secure one end of a rubber exercise band/tubing to a fixed object (table, pole). Loop the other end around your foot just before your toes.  Place your fists between your knees. This will focus your strengthening at your ankle.  Slowly, pull your big toe up and in, making sure the band/tubing is positioned to resist the entire motion.  Hold this position for __________ seconds.  Have your muscles resist the band/tubing as it slowly pulls your foot back to the starting position. Repeat __________ times. Complete this exercises __________ times per day.  Document Released: 03/01/2005 Document Revised: 05/24/2011 Document Reviewed: 06/13/2008 [employer] Patient Information 2015 [employer]. This information is not intended to replace advice given to you by your health care provider. Make sure you discuss any questions you have with your health care provider.  

## 2014-01-18 ENCOUNTER — Telehealth: Payer: Self-pay | Admitting: *Deleted

## 2014-01-18 ENCOUNTER — Encounter: Payer: Self-pay | Admitting: Podiatry

## 2014-01-18 ENCOUNTER — Encounter: Payer: Self-pay | Admitting: *Deleted

## 2014-01-18 NOTE — Progress Notes (Signed)
Patient ID: Alex Underwood, male   DOB: [birth year], 68 y.o.   MRN: [id number]  Subjective: Patient returns the office they follow up evaluation of bilateral heel pain. He states that since last appointment he is doing significantly better. He states that he does have some pain in his right heel periodically although it is improved. He states he has increased his walking without much pain although he does continue to have some mild discomfort. He has been continue with icing as well as stretching exercises. He is also been continuing with the plantar fascial brace. Denies any recent injury or trauma to the area. No other complaints at this time. No acute changes since last appointment.  Objective: AAO x3, NAD DP/PT pulses palpable bilaterally, CRT less than 3 seconds Protective sensation intact with Simms Weinstein monofilament, vibratory sensation intact, Achilles tendon reflex intact Mild tenderness to palpation over the plantar tubercle of the calcaneus at the insertion of the plantar fascia on the right foot. There is no discomfort with lateral compression or with vibratory sensation. There is no pain along the posterior aspect of the calcaneus or along the course of the Achilles tendon. There is no pain along the course of plantar fascial in the arch of the foot. MMT 5/5, ROM WNL No calf pain with compression, swelling, warmth, erythema.  No open lesions.   Assessment: 68 year old male bilateral plantar fasciitis presents pain to the right heel today.  Plan: -Conservative versus surgical treatment discussed including alternatives, risks, complications. -Patient elects to proceed with steroid injection into the right heel. Under sterile skin preparation, a total of 2.5cc of kenalog 10, 0.5% Marcaine plain, and 2% lidocaine plain were infiltrated into the symptomatic area without complication. A band-aid was applied. Patient tolerated the injection well without complication.  -Continue with  stretching exercises. -Continue with ice to the affected area -Continue plantar fascial brace. -Follow-up in one month or sooner if any problems are to arise. In the meantime call the office with any questions, concerns, change in symptoms.

## 2014-01-18 NOTE — Telephone Encounter (Signed)
I was in there yesterday.  I noticed on the medication list there's a couple of things I don't take anymore.  They are Testosterone 5 mg and Effexor 150.  I don't take those anymore, thank you.

## 2014-02-14 ENCOUNTER — Ambulatory Visit (INDEPENDENT_AMBULATORY_CARE_PROVIDER_SITE_OTHER): Payer: Medicare Other | Admitting: Podiatry

## 2014-02-14 DIAGNOSIS — M722 Plantar fascial fibromatosis: Secondary | ICD-10-CM

## 2014-02-14 MED ORDER — TRIAMCINOLONE ACETONIDE 10 MG/ML IJ SUSP
10.0000 mg | Freq: Once | INTRAMUSCULAR | Status: AC
  Administered 2014-02-14: 10 mg

## 2014-02-14 NOTE — Patient Instructions (Signed)
Plantar Fasciitis (Heel Spur Syndrome) with Rehab The plantar fascia is a fibrous, ligament-like, soft-tissue structure that spans the bottom of the foot. Plantar fasciitis is a condition that causes pain in the foot due to inflammation of the tissue. SYMPTOMS   Pain and tenderness on the underneath side of the foot.  Pain that worsens with standing or walking. CAUSES  Plantar fasciitis is caused by irritation and injury to the plantar fascia on the underneath side of the foot. Common mechanisms of injury include:  Direct trauma to bottom of the foot.  Damage to a small nerve that runs under the foot where the main fascia attaches to the heel bone.  Stress placed on the plantar fascia due to bone spurs. RISK INCREASES WITH:   Activities that place stress on the plantar fascia (running, jumping, pivoting, or cutting).  Poor strength and flexibility.  Improperly fitted shoes.  Tight calf muscles.  Flat feet.  Failure to warm-up properly before activity.  Obesity. PREVENTION  Warm up and stretch properly before activity.  Allow for adequate recovery between workouts.  Maintain physical fitness:  Strength, flexibility, and endurance.  Cardiovascular fitness.  Maintain a health body weight.  Avoid stress on the plantar fascia.  Wear properly fitted shoes, including arch supports for individuals who have flat feet. PROGNOSIS  If treated properly, then the symptoms of plantar fasciitis usually resolve without surgery. However, occasionally surgery is necessary. RELATED COMPLICATIONS   Recurrent symptoms that may result in a chronic condition.  Problems of the lower back that are caused by compensating for the injury, such as limping.  Pain or weakness of the foot during push-off following surgery.  Chronic inflammation, scarring, and partial or complete fascia tear, occurring more often from repeated injections. TREATMENT  Treatment initially involves the use of  ice and medication to help reduce pain and inflammation. The use of strengthening and stretching exercises may help reduce pain with activity, especially stretches of the Achilles tendon. These exercises may be performed at home or with a therapist. Your caregiver may recommend that you use heel cups of arch supports to help reduce stress on the plantar fascia. Occasionally, corticosteroid injections are given to reduce inflammation. If symptoms persist for greater than 6 months despite non-surgical (conservative), then surgery may be recommended.  MEDICATION   If pain medication is necessary, then nonsteroidal anti-inflammatory medications, such as aspirin and ibuprofen, or other minor pain relievers, such as acetaminophen, are often recommended.  Do not take pain medication within 7 days before surgery.  Prescription pain relievers may be given if deemed necessary by your caregiver. Use only as directed and only as much as you need.  Corticosteroid injections may be given by your caregiver. These injections should be reserved for the most serious cases, because they may only be given a certain number of times. HEAT AND COLD  Cold treatment (icing) relieves pain and reduces inflammation. Cold treatment should be applied for 10 to 15 minutes every 2 to 3 hours for inflammation and pain and immediately after any activity that aggravates your symptoms. Use ice packs or massage the area with a piece of ice (ice massage).  Heat treatment may be used prior to performing the stretching and strengthening activities prescribed by your caregiver, physical therapist, or athletic trainer. Use a heat pack or soak the injury in warm water. SEEK IMMEDIATE MEDICAL CARE IF:  Treatment seems to offer no benefit, or the condition worsens.  Any medications produce adverse side effects. EXERCISES RANGE   OF MOTION (ROM) AND STRETCHING EXERCISES - Plantar Fasciitis (Heel Spur Syndrome) These exercises may help you  when beginning to rehabilitate your injury. Your symptoms may resolve with or without further involvement from your physician, physical therapist or athletic trainer. While completing these exercises, remember:   Restoring tissue flexibility helps normal motion to return to the joints. This allows healthier, less painful movement and activity.  An effective stretch should be held for at least 30 seconds.  A stretch should never be painful. You should only feel a gentle lengthening or release in the stretched tissue. RANGE OF MOTION - Toe Extension, Flexion  Sit with your right / left leg crossed over your opposite knee.  Grasp your toes and gently pull them back toward the top of your foot. You should feel a stretch on the bottom of your toes and/or foot.  Hold this stretch for __________ seconds.  Now, gently pull your toes toward the bottom of your foot. You should feel a stretch on the top of your toes and or foot.  Hold this stretch for __________ seconds. Repeat __________ times. Complete this stretch __________ times per day.  RANGE OF MOTION - Ankle Dorsiflexion, Active Assisted  Remove shoes and sit on a chair that is preferably not on a carpeted surface.  Place right / left foot under knee. Extend your opposite leg for support.  Keeping your heel down, slide your right / left foot back toward the chair until you feel a stretch at your ankle or calf. If you do not feel a stretch, slide your bottom forward to the edge of the chair, while still keeping your heel down.  Hold this stretch for __________ seconds. Repeat __________ times. Complete this stretch __________ times per day.  STRETCH - Gastroc, Standing  Place hands on wall.  Extend right / left leg, keeping the front knee somewhat bent.  Slightly point your toes inward on your back foot.  Keeping your right / left heel on the floor and your knee straight, shift your weight toward the wall, not allowing your back to  arch.  You should feel a gentle stretch in the right / left calf. Hold this position for __________ seconds. Repeat __________ times. Complete this stretch __________ times per day. STRETCH - Soleus, Standing  Place hands on wall.  Extend right / left leg, keeping the other knee somewhat bent.  Slightly point your toes inward on your back foot.  Keep your right / left heel on the floor, bend your back knee, and slightly shift your weight over the back leg so that you feel a gentle stretch deep in your back calf.  Hold this position for __________ seconds. Repeat __________ times. Complete this stretch __________ times per day. STRETCH - Gastrocsoleus, Standing  Note: This exercise can place a lot of stress on your foot and ankle. Please complete this exercise only if specifically instructed by your caregiver.   Place the ball of your right / left foot on a step, keeping your other foot firmly on the same step.  Hold on to the wall or a rail for balance.  Slowly lift your other foot, allowing your body weight to press your heel down over the edge of the step.  You should feel a stretch in your right / left calf.  Hold this position for __________ seconds.  Repeat this exercise with a slight bend in your right / left knee. Repeat __________ times. Complete this stretch __________ times per day.    STRENGTHENING EXERCISES - Plantar Fasciitis (Heel Spur Syndrome)  These exercises may help you when beginning to rehabilitate your injury. They may resolve your symptoms with or without further involvement from your physician, physical therapist or athletic trainer. While completing these exercises, remember:   Muscles can gain both the endurance and the strength needed for everyday activities through controlled exercises.  Complete these exercises as instructed by your physician, physical therapist or athletic trainer. Progress the resistance and repetitions only as guided. STRENGTH -  Towel Curls  Sit in a chair positioned on a non-carpeted surface.  Place your foot on a towel, keeping your heel on the floor.  Pull the towel toward your heel by only curling your toes. Keep your heel on the floor.  If instructed by your physician, physical therapist or athletic trainer, add ____________________ at the end of the towel. Repeat __________ times. Complete this exercise __________ times per day. STRENGTH - Ankle Inversion  Secure one end of a rubber exercise band/tubing to a fixed object (table, pole). Loop the other end around your foot just before your toes.  Place your fists between your knees. This will focus your strengthening at your ankle.  Slowly, pull your big toe up and in, making sure the band/tubing is positioned to resist the entire motion.  Hold this position for __________ seconds.  Have your muscles resist the band/tubing as it slowly pulls your foot back to the starting position. Repeat __________ times. Complete this exercises __________ times per day.  Document Released: 03/01/2005 Document Revised: 05/24/2011 Document Reviewed: 06/13/2008 [employer] Patient Information 2015 [employer]. This information is not intended to replace advice given to you by your health care provider. Make sure you discuss any questions you have with your health care provider.  

## 2014-02-18 NOTE — Progress Notes (Signed)
Patient ID: Alex Underwood, male   DOB: Dec 28, 1945, 68 y.o.   MRN: [id number]  Subjective: 67 year old male returns the office today for follow-up evaluation of plantar fasciitis and pain into his right heel. 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 This time the patient is requesting another steroid injection. He denies any other changes since last appointment. Denies any systemic complaints such as fevers, chills, nausea, vomiting. No other complaints at this time.  Objective: AAO x3, NAD DP/PT pulses palpable bilaterally, CRT less than 3 seconds Protective sensation intact with Simms Weinstein monofilament, vibratory sensation intact, Achilles tendon reflex intact Mild tenderness to palpation around the plantar medial tubercle of the calcaneus at the insertion of the plantar fascia on the right foot. There is no pain with lateral compression of calcaneus or pain with vibratory sensation. There is no pain along the posterior course of the calcaneus or along the course/insertion of the Achilles tendon. There is no overlying edema, erythema, increased warmth. There is no pain on the left foot. MMT 5/5, ROM WNL No pain with calf compression, swelling, warmth, erythema. No open lesions or pre-ulcerative lesions.  Assessment: 68 year old male with right heel pain, likely plantar fasciitis.  Plan: -Treatment options were discussed including alternatives, risks, complications. -Patient elects to proceed with steroid injection into the right heel. Under sterile skin preparation, a total of 2.5cc of kenalog 10, 0.5% Marcaine plain, and 2% lidocaine plain were infiltrated into the symptomatic area without complication. A band-aid was applied. Patient tolerated the injection well without complication. Discussed the patient ice the area  over the next couple days to help prevent a steroid flare. -Continue stretching exercises. -Ice to the effected area. -Discussed shoe gear changes. -Follow-up as needed. In the meantime, call the office with any questions, concerns, change in symptoms.

## 2015-02-05 DIAGNOSIS — R739 Hyperglycemia, unspecified: Secondary | ICD-10-CM | POA: Insufficient documentation

## 2015-02-13 DIAGNOSIS — C4491 Basal cell carcinoma of skin, unspecified: Secondary | ICD-10-CM

## 2015-02-13 HISTORY — DX: Basal cell carcinoma of skin, unspecified: C44.91

## 2015-06-23 ENCOUNTER — Other Ambulatory Visit: Payer: Self-pay | Admitting: Otolaryngology

## 2015-06-23 DIAGNOSIS — K1121 Acute sialoadenitis: Secondary | ICD-10-CM

## 2015-06-30 ENCOUNTER — Other Ambulatory Visit
Admission: RE | Admit: 2015-06-30 | Discharge: 2015-06-30 | Disposition: A | Discharge status: Home / Self Care | Payer: Medicare Other | Source: Ambulatory Visit | Attending: Otolaryngology | Admitting: Otolaryngology

## 2015-06-30 ENCOUNTER — Ambulatory Visit
Admission: RE | Admit: 2015-06-30 | Discharge: 2015-06-30 | Disposition: A | Discharge status: Home / Self Care | Payer: Medicare Other | Source: Ambulatory Visit | Attending: Otolaryngology | Admitting: Otolaryngology

## 2015-06-30 DIAGNOSIS — K1121 Acute sialoadenitis: Secondary | ICD-10-CM | POA: Diagnosis not present

## 2015-06-30 DIAGNOSIS — Z01812 Encounter for preprocedural laboratory examination: Secondary | ICD-10-CM | POA: Diagnosis present

## 2015-06-30 LAB — CREATININE, SERUM
Creatinine, Ser: 1.09 mg/dL (ref 0.61–1.24)
GFR calc Af Amer: >60 mL/min (ref >60)
GFR calc non Af Amer: >60 mL/min (ref >60)

## 2015-06-30 LAB — BUN: BUN: 12 mg/dL (ref 6–20)

## 2015-06-30 MED ORDER — IOHEXOL 300 MG/ML  SOLN
75.0000 mL | Freq: Once | INTRAMUSCULAR | Status: AC | PRN
  Administered 2015-06-30: 75 mL via INTRAVENOUS

## 2015-07-15 ENCOUNTER — Other Ambulatory Visit: Payer: Self-pay

## 2015-07-15 ENCOUNTER — Encounter: Payer: Self-pay | Admitting: *Deleted

## 2015-07-15 NOTE — Patient Instructions (Signed)
  Your procedure is scheduled on: 07-21-15  Report to [facility] To find out your arrival time please call [phone] between 1PM - 3PM on 07-18-15  Remember: Instructions that are not followed completely may result in serious medical risk, up to and including death, or upon the discretion of your surgeon and anesthesiologist your surgery may need to be rescheduled.    _X___ 1. Do not eat food or drink liquids after midnight. No gum chewing or hard candies.     _X___ 2. No Alcohol for 24 hours before or after surgery.   ____ 3. Bring all medications with you on the day of surgery if instructed.    ____ 4. Notify your doctor if there is any change in your medical condition     (cold, fever, infections).     Do not wear jewelry, make-up, hairpins, clips or nail polish.  Do not wear lotions, powders, or perfumes. You may wear deodorant.  Do not shave 48 hours prior to surgery. Men may shave face and neck.  Do not bring valuables to the hospital.    [facility] is not responsible for any belongings or valuables.               Contacts, dentures or bridgework may not be worn into surgery.  Leave your suitcase in the car. After surgery it may be brought to your room.  For patients admitted to the hospital, discharge time is determined by your treatment team.   Patients discharged the day of surgery will not be allowed to drive home.   Please read over the following fact sheets that you were given:      _X___ Take these medicines the morning of surgery with A SIP OF WATER:    1. AMLODIPINE  2. PRISTIQ  3.   4.  5.  6.  ____ Fleet Enema (as directed)   ____ Use CHG Soap as directed  ____ Use inhalers on the day of surgery  ____ Stop metformin 2 days prior to surgery    ____ Take 1/2 of usual insulin dose the night before surgery and none on the morning of surgery.   _X___ Stop Coumadin/Plavix/aspirin-PT STOPPED ASA 07-15-15   _X___ Stop  Anti-inflammatories-NO NSAIDS OR ASA PRODUCTS-TYLENOL OK TO TAKE   ____ Stop supplements until after surgery.    _X___ Bring C-Pap to the hospital.

## 2015-07-18 NOTE — Pre-Procedure Instructions (Signed)
Harrold Donath, MD - 07/17/2015 3:30 PM EDT Formatting of this note may be different from the original. Alex Underwood is a 70 y.o. male here for preop consultation for Dr Kathyrn Sheriff  CHIEF COMPLAINT:  Preop consult  Patient Active Problem List  Diagnosis  . Hypertension  . Insomnia  . Pure hypercholesterolemia  . Hypogonadism male  . Depression, major, in partial remission (CMS-HCC)  . Hyperglycemia, unspecified   HISTORY OF PRESENT ILLNESS:  Hypertension Bp controlled, not dizzy. Walks 2 miles without problems and 30 min on eliptical. No cp or other issues noted.   Hyperglycemia Recent mild increase, not diabetic and again active and feeling well. 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   Past Medical History  Diagnosis Date  . Cataract cortical, senile  bilateral  . Depression, unspecified  . History of hyperlipidemia  . Hypertension  . Hypogonadism male  . Insomnia  . Low testosterone  causing fatigue   Past Surgical History  Procedure Laterality Date  . Prostate surgery   No fever chills or sweats No nausea, vomiting or diarrhea No chest pain, shortness of breath   Social History   Social History  . Marital status: Married  Spouse name: N/A  . Number of children: N/A  . Years of education: N/A   Occupational History  . Not on file.   Social History Main Topics  . Smoking status: Never Smoker  . Smokeless tobacco: Never Used  . Alcohol use No  . Drug use: No  . Sexual activity: Defer   Other Topics Concern  . Not on file   Social History Narrative     Current Outpatient Prescriptions:  . amLODIPine (NORVASC) 5 MG tablet, Take 1 tablet (5 mg total) by mouth once daily., Disp: 90 tablet, Rfl: 3 . aspirin 81 MG EC tablet, Take 81 mg by mouth once daily., Disp: , Rfl:  . desvenlafaxine succinate (PRISTIQ) 50 MG ER tablet, Take 1 tablet (50 mg total) by mouth once daily., Disp: 90 tablet, Rfl: 3 . multivitamin tablet, Take 1 tablet by  mouth once daily., Disp: , Rfl:  . testosterone (ANDRODERM) 4 mg/24 hr patch, Place 1 patch onto the skin once daily., Disp: 90 patch, Rfl: 1 . traZODone (DESYREL) 150 MG tablet, Take 1 tablet (150 mg total) by mouth nightly., Disp: 90 tablet, Rfl: 3  Vitals:  07/17/15 1544  BP: 122/78  Pulse: 64  Resp: 16  Body mass index is 33.86 kg/(m^2). No acute distress Lungs; clear to ascultation Heart; Regular rate and rhythm  Abdomen; Soft and flat, normal bowel sounds Extremities; No clubbing, cyanosis or edema  Office Visit on 07/17/2015  Component Date Value Ref Range Status  . Vent Rate (bpm) 07/17/2015 62 Preliminary  . PR Interval (msec) 07/17/2015 204 Preliminary  . QRS Interval (msec) 07/17/2015 106 Preliminary  . QT Interval (msec) 07/17/2015 422 Preliminary  . QTc (msec) 07/17/2015 428 Preliminary   ASSESSMENT AND PLAN:  Diagnoses and all orders for this visit:  Preop cardiovascular exam - ECG 12-lead  Essential hypertension  Hyperglycemia, unspecified  OSA on CPAP  Stable and low risk pt for a low risk (in regards to cardiopulmonary complications) and no other pre op issues. He is taking is cpap to the hospital for the surgery as well.     Miscellaneous Notes - in this encounter Table of Contents for Miscellaneous Notes Assessment & Plan Note - Harrold Donath, MD - 07/17/2015 3:50 PM EDT Assessment &  Plan Note - Harrold Donath, MD - 07/17/2015 3:48 PM EDT  Assessment & Plan Note - Harrold Donath, MD - 07/17/2015 3:50 PM EDT Associated Problem(s): Hyperglycemia, unspecified  Recent mild increase, not diabetic and again active and feeling well. 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   Back to top of Miscellaneous Notes Assessment & Plan Note - Harrold Donath, MD - 07/17/2015 3:48 PM EDT Associated Problem(s): Hypertension  Bp controlled, not dizzy. Walks 2 miles without problems and 30 min on eliptical.  No cp or other issues noted.   Back to top of Miscellaneous Notes Plan of Treatment - as of this encounter Upcoming Encounters Upcoming Encounters  Date Type Specialty Care Team Description  07/29/2015 Ancillary Orders Lab Harrold Donath, MD  [place]  [facility] [place], [place] [address]  [phone]  [phone] (Fax)    08/05/2015 Office Visit Internal Medicine Harrold Donath, MD  [place]  [facility] [place], [place] [address]  [phone]  [phone] (Fax)     Pending Results Pending Results  Name Priority Associated Diagnoses Date/Time  ECG 12-lead Routine Preop cardiovascular exam  07/17/2015 3:28 PM EDT  Visit Diagnoses - in this encounter Diagnosis  Preop cardiovascular exam - Primary  Pre-operative cardiovascular examination   Essential hypertension  Hyperglycemia, unspecified  Other abnormal glucose   OSA on CPAP  Document Information Service Providers Document Coverage Dates May. 04, 2017 - May. 04, 2017 [phone] (Work) [place], [place] [address] Encounter Providers Harrold Donath MD (Attending) [phone] (Work) [phone] (Fax)  [facility] [place], [place] [address]

## 2015-07-18 NOTE — Pre-Procedure Instructions (Addendum)
MEDICAL CLEARANCE IN [place] EPIC FROM DR Ouida Sills ON 07-17-15-LOW RISK

## 2015-07-21 ENCOUNTER — Encounter
Admission: RE | Disposition: A | Discharge status: Home / Self Care | Payer: Self-pay | Source: Ambulatory Visit | Attending: Otolaryngology

## 2015-07-21 ENCOUNTER — Ambulatory Visit: Payer: Medicare Other | Admitting: Anesthesiology

## 2015-07-21 ENCOUNTER — Observation Stay
Admission: RE | Admit: 2015-07-21 | Discharge: 2015-07-22 | Disposition: A | Discharge status: Home / Self Care | Payer: Medicare Other | Source: Ambulatory Visit | Attending: Otolaryngology | Admitting: Otolaryngology

## 2015-07-21 DIAGNOSIS — Z8249 Family history of ischemic heart disease and other diseases of the circulatory system: Secondary | ICD-10-CM | POA: Insufficient documentation

## 2015-07-21 DIAGNOSIS — C7A1 Malignant poorly differentiated neuroendocrine tumors: Secondary | ICD-10-CM | POA: Diagnosis not present

## 2015-07-21 DIAGNOSIS — Z880 Allergy status to penicillin: Secondary | ICD-10-CM | POA: Insufficient documentation

## 2015-07-21 DIAGNOSIS — Z8546 Personal history of malignant neoplasm of prostate: Secondary | ICD-10-CM | POA: Diagnosis not present

## 2015-07-21 DIAGNOSIS — K1120 Sialoadenitis, unspecified: Secondary | ICD-10-CM | POA: Diagnosis present

## 2015-07-21 DIAGNOSIS — G4700 Insomnia, unspecified: Secondary | ICD-10-CM | POA: Insufficient documentation

## 2015-07-21 DIAGNOSIS — Z7982 Long term (current) use of aspirin: Secondary | ICD-10-CM | POA: Diagnosis not present

## 2015-07-21 DIAGNOSIS — E785 Hyperlipidemia, unspecified: Secondary | ICD-10-CM | POA: Insufficient documentation

## 2015-07-21 DIAGNOSIS — Z885 Allergy status to narcotic agent status: Secondary | ICD-10-CM | POA: Diagnosis not present

## 2015-07-21 DIAGNOSIS — Z85828 Personal history of other malignant neoplasm of skin: Secondary | ICD-10-CM | POA: Diagnosis not present

## 2015-07-21 DIAGNOSIS — Z79899 Other long term (current) drug therapy: Secondary | ICD-10-CM | POA: Diagnosis not present

## 2015-07-21 DIAGNOSIS — E7800 Pure hypercholesterolemia, unspecified: Secondary | ICD-10-CM | POA: Insufficient documentation

## 2015-07-21 DIAGNOSIS — Z808 Family history of malignant neoplasm of other organs or systems: Secondary | ICD-10-CM | POA: Diagnosis not present

## 2015-07-21 DIAGNOSIS — K1121 Acute sialoadenitis: Secondary | ICD-10-CM | POA: Diagnosis not present

## 2015-07-21 DIAGNOSIS — I10 Essential (primary) hypertension: Secondary | ICD-10-CM | POA: Insufficient documentation

## 2015-07-21 DIAGNOSIS — D490 Neoplasm of unspecified behavior of digestive system: Secondary | ICD-10-CM | POA: Diagnosis present

## 2015-07-21 DIAGNOSIS — F329 Major depressive disorder, single episode, unspecified: Secondary | ICD-10-CM | POA: Diagnosis not present

## 2015-07-21 DIAGNOSIS — Z9849 Cataract extraction status, unspecified eye: Secondary | ICD-10-CM | POA: Diagnosis not present

## 2015-07-21 DIAGNOSIS — Z85820 Personal history of malignant melanoma of skin: Secondary | ICD-10-CM | POA: Diagnosis not present

## 2015-07-21 HISTORY — PX: PAROTIDECTOMY: SHX2163

## 2015-07-21 HISTORY — DX: Sleep apnea, unspecified: G47.30

## 2015-07-21 HISTORY — DX: Malignant (primary) neoplasm, unspecified: C80.1

## 2015-07-21 HISTORY — DX: Gastro-esophageal reflux disease without esophagitis: K21.9

## 2015-07-21 HISTORY — DX: Essential (primary) hypertension: I10

## 2015-07-21 SURGERY — EXCISION, PAROTID GLAND
Anesthesia: General | Site: Neck | Laterality: Right | Wound class: Clean

## 2015-07-21 MED ORDER — FAMOTIDINE 20 MG PO TABS
ORAL_TABLET | ORAL | Status: AC
  Administered 2015-07-21: 20 mg via ORAL
  Filled 2015-07-21: qty 1

## 2015-07-21 MED ORDER — VENLAFAXINE HCL ER 75 MG PO CP24
75.0000 mg | ORAL_CAPSULE | Freq: Every day | ORAL | Status: DC
  Administered 2015-07-22: 75 mg via ORAL
  Filled 2015-07-21: qty 1

## 2015-07-21 MED ORDER — TRAZODONE HCL 50 MG PO TABS
150.0000 mg | ORAL_TABLET | Freq: Every day | ORAL | Status: DC
  Administered 2015-07-21: 150 mg via ORAL
  Filled 2015-07-21: qty 1

## 2015-07-21 MED ORDER — LACTATED RINGERS IV SOLN
INTRAVENOUS | Status: DC
  Administered 2015-07-21 (×2): via INTRAVENOUS

## 2015-07-21 MED ORDER — ACETAMINOPHEN 650 MG RE SUPP: 650.0000 mg | Freq: Four times a day (QID) | RECTAL | Status: DC | PRN

## 2015-07-21 MED ORDER — FENTANYL CITRATE (PF) 100 MCG/2ML IJ SOLN
INTRAMUSCULAR | Status: DC | PRN
  Administered 2015-07-21 (×5): 50 ug via INTRAVENOUS

## 2015-07-21 MED ORDER — ACETAMINOPHEN 325 MG PO TABS: 650.0000 mg | ORAL_TABLET | Freq: Four times a day (QID) | ORAL | Status: DC | PRN

## 2015-07-21 MED ORDER — BISACODYL 5 MG PO TBEC: 5.0000 mg | DELAYED_RELEASE_TABLET | Freq: Every day | ORAL | Status: DC | PRN

## 2015-07-21 MED ORDER — FENTANYL CITRATE (PF) 100 MCG/2ML IJ SOLN
25.0000 ug | INTRAMUSCULAR | Status: DC | PRN
  Administered 2015-07-21 (×4): 25 ug via INTRAVENOUS

## 2015-07-21 MED ORDER — LIDOCAINE-EPINEPHRINE (PF) 1 %-1:200000 IJ SOLN
INTRAMUSCULAR | Status: DC | PRN
  Administered 2015-07-21: 6 mL

## 2015-07-21 MED ORDER — ONDANSETRON HCL 4 MG/2ML IJ SOLN: 4.0000 mg | Freq: Four times a day (QID) | INTRAMUSCULAR | Status: DC | PRN

## 2015-07-21 MED ORDER — ONDANSETRON HCL 4 MG/2ML IJ SOLN: 4.0000 mg | Freq: Once | INTRAMUSCULAR | Status: DC | PRN

## 2015-07-21 MED ORDER — DEXAMETHASONE SODIUM PHOSPHATE 10 MG/ML IJ SOLN
INTRAMUSCULAR | Status: DC | PRN
  Administered 2015-07-21: 10 mg via INTRAVENOUS

## 2015-07-21 MED ORDER — MIDAZOLAM HCL 2 MG/2ML IJ SOLN
INTRAMUSCULAR | Status: DC | PRN
  Administered 2015-07-21: 2 mg via INTRAVENOUS

## 2015-07-21 MED ORDER — GLYCOPYRROLATE 0.2 MG/ML IJ SOLN
INTRAMUSCULAR | Status: DC | PRN
  Administered 2015-07-21: 0.2 mg via INTRAVENOUS

## 2015-07-21 MED ORDER — SUCCINYLCHOLINE CHLORIDE 20 MG/ML IJ SOLN
INTRAMUSCULAR | Status: DC | PRN
  Administered 2015-07-21: 100 mg via INTRAVENOUS

## 2015-07-21 MED ORDER — LIDOCAINE-EPINEPHRINE (PF) 1 %-1:200000 IJ SOLN
INTRAMUSCULAR | Status: AC
  Filled 2015-07-21: qty 30

## 2015-07-21 MED ORDER — LIDOCAINE HCL 2 % EX GEL
CUTANEOUS | Status: DC | PRN
  Administered 2015-07-21: 1 via TOPICAL

## 2015-07-21 MED ORDER — AMLODIPINE BESYLATE 5 MG PO TABS
5.0000 mg | ORAL_TABLET | ORAL | Status: DC
  Administered 2015-07-21: 5 mg via ORAL
  Filled 2015-07-21: qty 1

## 2015-07-21 MED ORDER — FLEET ENEMA 7-19 GM/118ML RE ENEM: 1.0000 | ENEMA | Freq: Once | RECTAL | Status: DC | PRN

## 2015-07-21 MED ORDER — SODIUM CHLORIDE 0.45 % IV SOLN
INTRAVENOUS | Status: DC
  Administered 2015-07-21 – 2015-07-22 (×2): via INTRAVENOUS

## 2015-07-21 MED ORDER — LIDOCAINE HCL 4 % EX SOLN
Freq: Once | CUTANEOUS | Status: DC
  Filled 2015-07-21: qty 10

## 2015-07-21 MED ORDER — LIDOCAINE HCL (CARDIAC) 20 MG/ML IV SOLN
INTRAVENOUS | Status: DC | PRN
  Administered 2015-07-21: 100 mg via INTRAVENOUS

## 2015-07-21 MED ORDER — FAMOTIDINE 20 MG PO TABS
20.0000 mg | ORAL_TABLET | Freq: Once | ORAL | Status: AC
  Administered 2015-07-21: 20 mg via ORAL

## 2015-07-21 MED ORDER — PROPOFOL 10 MG/ML IV BOLUS
INTRAVENOUS | Status: DC | PRN
  Administered 2015-07-21: 20 mg via INTRAVENOUS
  Administered 2015-07-21: 150 mg via INTRAVENOUS

## 2015-07-21 MED ORDER — FENTANYL CITRATE (PF) 100 MCG/2ML IJ SOLN
INTRAMUSCULAR | Status: AC
  Filled 2015-07-21: qty 2

## 2015-07-21 MED ORDER — ONDANSETRON 8 MG PO TBDP: 4.0000 mg | ORAL_TABLET | Freq: Four times a day (QID) | ORAL | Status: DC | PRN

## 2015-07-21 MED ORDER — BACITRACIN ZINC 500 UNIT/GM EX OINT
TOPICAL_OINTMENT | CUTANEOUS | Status: AC
Start: 2015-07-21 — End: 2015-07-21
  Filled 2015-07-21: qty 28.35

## 2015-07-21 MED ORDER — ACETAMINOPHEN 10 MG/ML IV SOLN
INTRAVENOUS | Status: DC | PRN
Start: 2015-07-21 — End: 2015-07-21
  Administered 2015-07-21: 1000 mg via INTRAVENOUS

## 2015-07-21 MED ORDER — BACITRACIN 500 UNIT/GM EX OINT
TOPICAL_OINTMENT | CUTANEOUS | Status: DC | PRN
  Administered 2015-07-21: 1 via TOPICAL

## 2015-07-21 MED ORDER — EPHEDRINE SULFATE 50 MG/ML IJ SOLN
INTRAMUSCULAR | Status: DC | PRN
  Administered 2015-07-21 (×4): 10 mg via INTRAVENOUS

## 2015-07-21 MED ORDER — ACETAMINOPHEN 10 MG/ML IV SOLN
INTRAVENOUS | Status: AC
  Filled 2015-07-21: qty 100

## 2015-07-21 MED ORDER — ONDANSETRON HCL 4 MG/2ML IJ SOLN
INTRAMUSCULAR | Status: DC | PRN
  Administered 2015-07-21: 4 mg via INTRAVENOUS

## 2015-07-21 MED ORDER — TRAMADOL HCL 50 MG PO TABS
50.0000 mg | ORAL_TABLET | ORAL | Status: DC | PRN
  Administered 2015-07-21 – 2015-07-22 (×3): 50 mg via ORAL
  Filled 2015-07-21 (×3): qty 1

## 2015-07-21 MED ORDER — MAGNESIUM HYDROXIDE 400 MG/5ML PO SUSP: 30.0000 mL | Freq: Every day | ORAL | Status: DC | PRN

## 2015-07-21 SURGICAL SUPPLY — 38 items
BLADE SURG 15 STRL LF DISP TIS (BLADE) ×1 IMPLANT
BLADE SURG 15 STRL SS (BLADE) ×2
CORD BIP STRL DISP 12FT (MISCELLANEOUS) ×3 IMPLANT
DRAIN TLS ROUND 10FR (DRAIN) ×3 IMPLANT
DRAPE INCISE 23X17 IOBAN STRL (DRAPES) ×4
DRAPE INCISE IOBAN 23X17 STRL (DRAPES) ×2 IMPLANT
DRAPE MAG INST 16X20 L/F (DRAPES) ×3 IMPLANT
DRESSING TELFA 4X3 1S ST N-ADH (GAUZE/BANDAGES/DRESSINGS) ×3 IMPLANT
DRSG TEGADERM 2-3/8X2-3/4 SM (GAUZE/BANDAGES/DRESSINGS) ×9 IMPLANT
DRSG TEGADERM 4X4.75 (GAUZE/BANDAGES/DRESSINGS) ×3 IMPLANT
ELECT CAUTERY BLADE TIP 2.5 (TIP) ×3
ELECT EMG 20MM DUAL (MISCELLANEOUS) ×6
ELECT NEEDLE 20X.3 GREEN (MISCELLANEOUS) ×3
ELECT REM PT RETURN 9FT ADLT (ELECTROSURGICAL) ×3
ELECTRODE CAUTERY BLDE TIP 2.5 (TIP) ×1 IMPLANT
ELECTRODE EMG 20MM DUAL (MISCELLANEOUS) ×2 IMPLANT
ELECTRODE NEEDLE 20X.3 GREEN (MISCELLANEOUS) ×1 IMPLANT
ELECTRODE REM PT RTRN 9FT ADLT (ELECTROSURGICAL) ×1 IMPLANT
FORCEPS JEWEL BIP 4-3/4 STR (INSTRUMENTS) ×3 IMPLANT
GLOVE BIO SURGEON STRL SZ7.5 (GLOVE) ×3 IMPLANT
GLOVE EXAM LX STRL 7.5 (GLOVE) ×3 IMPLANT
GOWN STRL REUS W/ TWL LRG LVL3 (GOWN DISPOSABLE) ×3 IMPLANT
GOWN STRL REUS W/TWL LRG LVL3 (GOWN DISPOSABLE) ×6
HARMONIC SCALPEL FOCUS (MISCELLANEOUS) ×3 IMPLANT
HOOK STAY BLUNT/RETRACTOR 5M (MISCELLANEOUS) ×3 IMPLANT
LABEL OR SOLS (LABEL) IMPLANT
PACK HEAD/NECK (MISCELLANEOUS) ×3 IMPLANT
PROBE MONO 100X0.75 ELECT 1.9M (MISCELLANEOUS) ×3 IMPLANT
SPONGE KITTNER 5P (MISCELLANEOUS) ×9 IMPLANT
SPONGE XRAY 4X4 16PLY STRL (MISCELLANEOUS) ×9 IMPLANT
SUT ETHILON 6 0 9-3 1X18 BLK (SUTURE) IMPLANT
SUT MERSILENE 4-0 WHT RB-1 (SUTURE) IMPLANT
SUT PROLENE 6 0 PC 1 (SUTURE) ×6 IMPLANT
SUT SILK 2 0 (SUTURE) ×2
SUT SILK 2-0 18XBRD TIE 12 (SUTURE) ×1 IMPLANT
SUT SILK 3 0 REEL (SUTURE) ×3 IMPLANT
SUT VIC AB 4-0 RB1 18 (SUTURE) ×3 IMPLANT
SYSTEM CHEST DRAIN TLS 7FR (DRAIN) IMPLANT

## 2015-07-21 NOTE — Op Note (Signed)
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 A shoulder roll was placed with his head extended to the left side. Nerve monitoring was done by lysing electrodes around the right side of the mouth and the perioral muscle and then electrodes also around the right eye in the periorbital muscle. GRounds were placed into the right cheek muscles. These were secured in position and the nerve monitor for continuous monitoring. The skin incision was marked with a marking pen and 6 mL 1% Xylocaine with epi 1 200,000 was used for infiltration into the superficial skin overlying the parotid and mass. His prepped and draped in sterile fashion.  The incision was created starting just in front of the auricle in a skin crease and extending down beneath the right earlobe and into the posterior neck in a curvilinear fashion to find a skin crease. The inferior incision line was near the posterior end of the tumor mass. The skin was then elevated in the subcutaneous level above the parotid fascia. This was elevated anteriorly and then posteriorly as well. The posterior attachments of the parotid fascia to the sternocleidomastoid muscle were then separated to help free up posteriorly where the tumor was stuck down directly to the muscle and even in muscle tissue some. The was carried bluntly between the regular cartilage and the parotid tissue down to the bottom of the pointer. Small vessels were cauterized with the Harmonic scalpel. The  tympanomastoid suture line was then evident and the facial nerve was found seen out anteriorly at this level. Showed good stimulation of the face. Dissection was carried out above it to remove part of the superficial parotid. Dissection came through the parotid overlying the periurethral branch of the facial nerve. The tumor mass was below this and the upper branches did not need to be's dissected out. The inferior branch of the facial nerve was then found and parotid tissue and tumor inferior to that was dissected free. As much of the capsule of parotid as possible was spared for closure. The inferior portion of the parotid gland was freed up from the branches the facial nerve aching sure that they were spared. The inferior portion of the parotid gland was still attached to the tumor and the underlying SCM muscle, but was now separated completely from the superior parotid with the facial nerve intact.  Dissection was carried through some of the sternocleidomastoid muscle to get around the tumor mass. It was followed inferiorly and the tumor was inside some of the fascial sheath of the muscle. The fascial sheath was trimmed and some of the muscle was removed along with the mass. Deeply it went down to the digastric belly superiorly and along the face of the SCM inferiorly. Once it was completely removed it was sent to pathology fresh for frozen section and further evaluation. There were no further firm areas or masses evident in the wound. This was flushed copiously and any bleeding was controlled with bipolar cautery. The a shoulder  nerve was restimulated and there was appropriate movement of the face.  A 10 French TLS drain was then placed through separate stab incision and laid in the wound. 4/0 Mersilene was then use to bring the parotid fascia back to the posterior fascia of the SCM. Multiple horizontal mattress sutures were placed for dancing the parotid fascia posteriorly help close the wound and prevent  the deep depression. Once this was completed the skin was overlapping. Part of the posterior skin flap was then trimmed to make the neck wound incision line more posteriorly. A small portion of the anterior flap was trimmed as well to create a fresh edge. The vascularity of the flap was very good as it was fairly thick. Oral Vicryls were then used to close the dermis in an interrupted fashion all along the wound edge. The skin edges were then held malposition with a 6-0 Prolene running locking suture. The drain was placed to low continuous Vacutainer suction. The wound was covered with small amount of bacitracin followed by Telfa and Tegaderm.  Patient tolerated the procedure well. He was awakened taken to the recovery room in satisfactory condition. There were no operative complications.  Dispo:   To PACU to be sent to the MedSurg floor for observation overnight  Plan:  Pull the drain in the morning if the wound is doing well. We'll keep the drain to Vacutainer suction every 4 hours. We'll use tramadol for pain because a codeine allergy. Plan to discharge home in the morning if doing well  Alex Underwood  07/21/2015 11:09 AM

## 2015-07-21 NOTE — Anesthesia Procedure Notes (Signed)
Procedure Name: Intubation Date/Time: 07/21/2015 7:25 AM Performed by: Doreen Salvage Pre-anesthesia Checklist: Patient identified, Patient being monitored, Timeout performed, Emergency Drugs available and Suction available Patient Re-evaluated:Patient Re-evaluated prior to inductionOxygen Delivery Method: Circle system utilized Preoxygenation: Pre-oxygenation with 100% oxygen Intubation Type: IV induction Ventilation: Mask ventilation without difficulty Laryngoscope Size: Mac, 3 and McGraph Grade View: Grade I Tube type: Oral Tube size: 7.5 mm Number of attempts: 1 Airway Equipment and Method: Stylet Placement Confirmation: ETT inserted through vocal cords under direct vision,  positive ETCO2 and breath sounds checked- equal and bilateral Secured at: 23 cm Tube secured with: Tape Dental Injury: Teeth and Oropharynx as per pre-operative assessment

## 2015-07-21 NOTE — Anesthesia Postprocedure Evaluation (Signed)
Anesthesia Post Note  Patient: Ted Goodner Roedel  Procedure(s) Performed: Procedure(s) (LRB): PAROTIDECTOMY (Right)  Patient location during evaluation: PACU Anesthesia Type: General Level of consciousness: awake and alert Pain management: pain level controlled Vital Signs Assessment: post-procedure vital signs reviewed and stable Respiratory status: spontaneous breathing, nonlabored ventilation, respiratory function stable and patient connected to nasal cannula oxygen Cardiovascular status: blood pressure returned to baseline and stable Postop Assessment: no signs of nausea or vomiting Anesthetic complications: no    Last Vitals:  Filed Vitals:   07/21/15 1155 07/21/15 1215  BP: 137/69 136/58  Pulse: 74 78  Temp: 36.4 C   Resp: 16 17    Last Pain:  Filed Vitals:   07/21/15 1215  PainSc: 7                  Esli Clements S

## 2015-07-21 NOTE — Transfer of Care (Signed)
Immediate Anesthesia Transfer of Care Note  Patient: Alex Underwood  Procedure(s) Performed: Procedure(s): PAROTIDECTOMY (Right)  Patient Location: PACU  Anesthesia Type:General  Level of Consciousness: sedated  Airway & Oxygen Therapy: Patient Spontanous Breathing and Patient connected to face mask oxygen  Post-op Assessment: Report given to RN and Post -op Vital signs reviewed and stable  Post vital signs: Reviewed and stable  Last Vitals:  Filed Vitals:   07/21/15 0602 07/21/15 1055  BP: 123/50 137/72  Pulse: 53 87  Temp: 36.1 C 36.6 C  Resp: 16 19    Complications: No apparent anesthesia complications

## 2015-07-21 NOTE — Progress Notes (Signed)
07/21/2015 6:15 pm  Post op check S- Feeling good. Minimal pain, eating well.  O- Dressing dry. Drain ok. Will change vacutainer.  A- Tumor of right parotid, involving SCM. Frozen section favored lymphoma. P- Await final path report. Pull drain in am, and D/C home if doing well.  Margaretha Sheffield MD

## 2015-07-21 NOTE — Progress Notes (Signed)
Received pt from PACU to Rm 228. Pt AOx4. VSS. No signs of acute distress. IV access intact and saline locked. Medications administered (See MAR). Admission completed. Assessment completed. DVT prophylaxis assessed and completed.   Education provided on call bell, bed alarm, telephone and IV/IV Pole/IV Alarms. 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 White Board was completed and updated PRN.   Dietary specification confirmed.   Wife at bedside. Pt resting comfortably and quietly w/bed in low and locked position and call bell and telephone within reach. Will continue to monitor.

## 2015-07-21 NOTE — Anesthesia Preprocedure Evaluation (Addendum)
Anesthesia Evaluation  Patient identified by MRN, date of birth, ID band Patient awake    Reviewed: Allergy & Precautions, NPO status , Patient's Chart, lab work & pertinent test results, reviewed documented beta blocker date and time   Airway Mallampati: III  TM Distance: >3 FB     Dental  (+) Chipped, Missing, Dental Advisory Given, Partial Upper   Pulmonary sleep apnea and Continuous Positive Airway Pressure Ventilation ,           Cardiovascular hypertension, Pt. on medications      Neuro/Psych PSYCHIATRIC DISORDERS Depression    GI/Hepatic GERD  Controlled,  Endo/Other    Renal/GU      Musculoskeletal   Abdominal   Peds  Hematology   Anesthesia Other Findings Hx of ETOH. Will use CPAP tonite.  Reproductive/Obstetrics                            Anesthesia Physical Anesthesia Plan  ASA: III  Anesthesia Plan: General   Post-op Pain Management:    Induction: Intravenous  Airway Management Planned: Oral ETT  Additional Equipment:   Intra-op Plan:   Post-operative Plan:   Informed Consent: I have reviewed the patients History and Physical, chart, labs and discussed the procedure including the risks, benefits and alternatives for the proposed anesthesia with the patient or authorized representative who has indicated his/her understanding and acceptance.     Plan Discussed with: CRNA  Anesthesia Plan Comments:         Anesthesia Quick Evaluation

## 2015-07-21 NOTE — H&P (Signed)
  H&P has been reviewed and no changes necessary. To be downloaded later. The patien's heart has a regular rate and rhythm. His lung fields are clear.   Alex Underwood

## 2015-07-22 ENCOUNTER — Encounter: Payer: Self-pay | Admitting: Otolaryngology

## 2015-07-22 DIAGNOSIS — C7A1 Malignant poorly differentiated neuroendocrine tumors: Secondary | ICD-10-CM | POA: Diagnosis not present

## 2015-07-22 NOTE — Discharge Summary (Signed)
07/22/2015 7:20am  Discharge note.   S: Feeling good, minimal pain. Eating well.  O: AVSS,. Slight weakness marginal branch of facial nerve. Rest of face working well. Dressing dry. Drain removed. Redressed wound.  A: doing well post op. OK to go home. P: Discharge home. F/U 6 days for suture removal. Await final path. Increase activity as tolerated. Increase diet as tolerated. Tramadol script given for pain.

## 2015-07-25 LAB — SURGICAL PATHOLOGY

## 2015-07-31 ENCOUNTER — Inpatient Hospital Stay: Payer: Medicare Other | Attending: Oncology | Admitting: Oncology

## 2015-07-31 VITALS — BP 126/72 | HR 62 | Temp 97.1°F | Ht 70.08 in | Wt 236.1 lb

## 2015-07-31 DIAGNOSIS — I10 Essential (primary) hypertension: Secondary | ICD-10-CM | POA: Diagnosis not present

## 2015-07-31 DIAGNOSIS — K219 Gastro-esophageal reflux disease without esophagitis: Secondary | ICD-10-CM | POA: Insufficient documentation

## 2015-07-31 DIAGNOSIS — Z79899 Other long term (current) drug therapy: Secondary | ICD-10-CM | POA: Insufficient documentation

## 2015-07-31 DIAGNOSIS — D3A8 Other benign neuroendocrine tumors: Secondary | ICD-10-CM

## 2015-07-31 DIAGNOSIS — F329 Major depressive disorder, single episode, unspecified: Secondary | ICD-10-CM | POA: Diagnosis not present

## 2015-07-31 DIAGNOSIS — C7A1 Malignant poorly differentiated neuroendocrine tumors: Secondary | ICD-10-CM

## 2015-08-04 NOTE — Progress Notes (Signed)
[place]  Telephone:(336) [phone] Fax:(336) [phone]  ID: Alex Underwood OB: 04/08/1945  MR#: [id number]  [id number]#:[id number]  Patient Care Team: Kirk Ruths, MD as PCP - General (Internal Medicine)  CHIEF COMPLAINT:  Chief Complaint  Patient presents with  . New Evaluation    INTERVAL HISTORY: Patient is a 70 year old male who noticed an increased swelling near his right jawline while shaving approximately 4 weeks ago. It was mildly tender and continued increase in size. Subsequent biopsy and surgical removal revealed a neuroendocrine tumor, unclear of metastatic or primary lesion. Currently, he feels well and is asymptomatic. He has no neurologic complaints. He denies any recent fevers or illnesses. He has a good appetite and denies weight loss. He denies any pain. He denies any chest pain or shortness of breath. He denies any nausea, vomiting, constipation, or diarrhea. He has no urinary complaints. Patient feels at his baseline and offers no further specific complaints today.  REVIEW OF SYSTEMS:   Review of Systems  Constitutional: Negative.  Negative for fever, weight loss and malaise/fatigue.  HENT: Negative.  Negative for sore throat.   Respiratory: Negative.  Negative for hemoptysis and shortness of breath.   Cardiovascular: Negative.  Negative for chest pain.  Gastrointestinal: Negative.   Genitourinary: Negative.   Musculoskeletal: Negative.   Neurological: Negative.  Negative for weakness and headaches.  Psychiatric/Behavioral: Negative.     As per HPI. Otherwise, a complete review of systems is negatve.  PAST MEDICAL HISTORY: Past Medical History  Diagnosis Date  . Depression     Since 04/12  . Hypertension   . Sleep apnea     USES CPAP  . GERD (gastroesophageal reflux disease)     H/O  . Cancer ([place])     PROSTATE,BASAL CELL-FOREHEAD, RIGHT EYE-SQUAMOUS CELL RIGHT EAR    PAST SURGICAL HISTORY: Past Surgical History  Procedure  Laterality Date  . Prostatectomy    . Colonoscopy    . Eye surgery Bilateral     CATARACTS  . Penile prosthesis implant    . Toe surgery    . Parotidectomy Right 07/21/2015    Procedure: PAROTIDECTOMY;  Surgeon: Margaretha Sheffield, MD;  Location: [facility] ORS;  Service: ENT;  Laterality: Right;    FAMILY HISTORY No family history on file.     ADVANCED DIRECTIVES:    HEALTH MAINTENANCE: Social History  Substance Use Topics  . Smoking status: Never Smoker   . Smokeless tobacco: Not on file  . Alcohol Use: No     Colonoscopy:  PAP:  Bone density:  Lipid panel:  Allergies  Allergen Reactions  . Codeine Other (See Comments)    Hallucinations.  Freida Busman [Terbinafine Hcl] Rash  . Penicillins Rash    Current Outpatient Prescriptions  Medication Sig Dispense Refill  . amLODipine (NORVASC) 5 MG tablet Take 5 mg by mouth every morning.     Marland Kitchen azithromycin (ZITHROMAX) 250 MG tablet take 2 tablets by mouth once daily for 5 days  0  . desvenlafaxine (PRISTIQ) 50 MG 24 hr tablet Take 50 mg by mouth every morning.     . testosterone (ANDRODERM) 4 MG/24HR PT24 patch Place 1 patch onto the skin daily.    . traZODone (DESYREL) 150 MG tablet Take by mouth at bedtime.     No current facility-administered medications for this visit.    OBJECTIVE: Filed Vitals:   07/31/15 1136  BP: 126/72  Pulse: 62  Temp: 97.1 F (36.2 C)     Body  mass index is 33.8 kg/(m^2).    ECOG FS:0 - Asymptomatic  General: Well-developed, well-nourished, no acute distress. Eyes: Pink conjunctiva, anicteric sclera. HEENT: Normocephalic, moist mucous membranes, clear oropharnyx. Healing surgical scar on right neck just below TMJ joint. Lungs: Clear to auscultation bilaterally. Heart: Regular rate and rhythm. No rubs, murmurs, or gallops. Abdomen: Soft, nontender, nondistended. No organomegaly noted, normoactive bowel sounds. Musculoskeletal: No edema, cyanosis, or clubbing. Neuro: Alert, answering all questions  appropriately. Cranial nerves grossly intact. Skin: No rashes or petechiae noted. Psych: Normal affect. Lymphatics: No cervical, calvicular, axillary or inguinal LAD.   LAB RESULTS:  Lab Results  Component Value Date   NA 136 01/30/2011   K 3.9 01/30/2011   CL 99 01/30/2011   CO2 26 01/30/2011   GLUCOSE 122* 01/30/2011   BUN 12 06/30/2015   CREATININE 1.09 06/30/2015   CALCIUM 9.3 01/30/2011   GFRNONAA >60 06/30/2015   GFRAA >60 06/30/2015    Lab Results  Component Value Date   WBC 5.2 01/30/2011   HGB 14.4 01/30/2011   HCT 39.4 01/30/2011   MCV 89.3 01/30/2011   PLT 176 01/30/2011     STUDIES: No results found.  ASSESSMENT: Neuroendocrine tumor, consistent with small cell carcinoma  PLAN:    1. Neuroendocrine tumor: Pathology and imaging results reviewed independently, unclear if this is a primary or metastatic lesion. We will get PET scan for further evaluation and to complete the staging workup. If lesion is localized to the parotid, patient likely could have adjuvant XRT to help prevent local recurrence. If patient has metastatic disease, he will require systemic chemotherapy likely with cisplatin and etoposide.  Patient will return to clinic in 1 week to discuss his PET scan results and treatment planning.  Approximately 45 minutes was spent in discussion of which greater than 50% was consultation.  Patient expressed understanding and was in agreement with this plan. He also understands that He can call clinic at any time with any questions, concerns, or complaints.    Lloyd Huger, MD   08/04/2015 6:35 PM

## 2015-08-06 ENCOUNTER — Ambulatory Visit
Admission: RE | Admit: 2015-08-06 | Discharge: 2015-08-06 | Disposition: A | Discharge status: Home / Self Care | Payer: Medicare Other | Source: Ambulatory Visit | Attending: Oncology | Admitting: Oncology

## 2015-08-06 DIAGNOSIS — C7A1 Malignant poorly differentiated neuroendocrine tumors: Secondary | ICD-10-CM | POA: Diagnosis not present

## 2015-08-06 DIAGNOSIS — K118 Other diseases of salivary glands: Secondary | ICD-10-CM | POA: Diagnosis not present

## 2015-08-06 DIAGNOSIS — R935 Abnormal findings on diagnostic imaging of other abdominal regions, including retroperitoneum: Secondary | ICD-10-CM | POA: Insufficient documentation

## 2015-08-06 DIAGNOSIS — Z9889 Other specified postprocedural states: Secondary | ICD-10-CM | POA: Insufficient documentation

## 2015-08-06 LAB — GLUCOSE, CAPILLARY: Glucose-Capillary: 110 mg/dL — ABNORMAL HIGH (ref 65–99)

## 2015-08-06 MED ORDER — FLUDEOXYGLUCOSE F - 18 (FDG) INJECTION
11.8650 | Freq: Once | INTRAVENOUS | Status: AC | PRN
  Administered 2015-08-06: 11.865 via INTRAVENOUS

## 2015-08-07 ENCOUNTER — Inpatient Hospital Stay (HOSPITAL_BASED_OUTPATIENT_CLINIC_OR_DEPARTMENT_OTHER): Payer: Medicare Other | Admitting: Oncology

## 2015-08-07 VITALS — BP 137/70 | HR 65 | Temp 97.9°F | Resp 18 | Wt 231.5 lb

## 2015-08-07 DIAGNOSIS — C7A1 Malignant poorly differentiated neuroendocrine tumors: Secondary | ICD-10-CM

## 2015-08-07 DIAGNOSIS — K219 Gastro-esophageal reflux disease without esophagitis: Secondary | ICD-10-CM | POA: Diagnosis not present

## 2015-08-07 DIAGNOSIS — I10 Essential (primary) hypertension: Secondary | ICD-10-CM

## 2015-08-07 DIAGNOSIS — D3A8 Other benign neuroendocrine tumors: Secondary | ICD-10-CM

## 2015-08-07 DIAGNOSIS — F329 Major depressive disorder, single episode, unspecified: Secondary | ICD-10-CM

## 2015-08-07 DIAGNOSIS — Z79899 Other long term (current) drug therapy: Secondary | ICD-10-CM

## 2015-08-08 NOTE — Progress Notes (Signed)
  Oncology Nurse Navigator Documentation  Navigator Location: [facility] (08/08/15 0900)                 Barriers/Navigation Needs: Coordination of Care (08/08/15 0900)   Interventions: Coordination of Care (08/08/15 0900)   Coordination of Care: EUS (08/08/15 0900)                  Time Spent with Patient: 30 (08/08/15 0900)   EUS referral from Dr Oleta Mouse. Unable to perform at [facility] due to scheduling. Patient is agreeable to have EUS performed at [facility]. Information sent and they will contact him for date/time/instructions. Patient has my contact information for any further questions or concerns.

## 2015-08-11 NOTE — Progress Notes (Signed)
[place]  Telephone:(336) [phone] Fax:(336) [phone]  ID: TARRIS DELBENE OB: Jun 26, 1945  MR#: [id number]  YYT#:[id number]  Patient Care Team: Kirk Ruths, MD as PCP - General (Internal Medicine)  CHIEF COMPLAINT:  Chief Complaint  Patient presents with  . Results    INTERVAL HISTORY: Patient returns to clinic today for further evaluation and discussion of his PET scan results. He continues to feel well and is asymptomatic. He is completely healed from his recent parotid surgery.  He has no neurologic complaints. He denies any recent fevers or illnesses. He has a good appetite and denies weight loss. He denies any pain. He denies any chest pain or shortness of breath. He denies any nausea, vomiting, constipation, or diarrhea. He has no urinary complaints. Patient offers no specific complaints today.  REVIEW OF SYSTEMS:   Review of Systems  Constitutional: Negative.  Negative for fever, weight loss and malaise/fatigue.  HENT: Negative.  Negative for sore throat.   Respiratory: Negative.  Negative for hemoptysis and shortness of breath.   Cardiovascular: Negative.  Negative for chest pain.  Gastrointestinal: Negative.  Negative for nausea, vomiting, abdominal pain, diarrhea, constipation, blood in stool and melena.  Genitourinary: Negative.   Musculoskeletal: Negative.   Neurological: Negative.  Negative for weakness and headaches.  Psychiatric/Behavioral: Negative.     As per HPI. Otherwise, a complete review of systems is negatve.  PAST MEDICAL HISTORY: Past Medical History  Diagnosis Date  . Depression     Since 04/12  . Hypertension   . Sleep apnea     USES CPAP  . GERD (gastroesophageal reflux disease)     H/O  . Cancer ([place])     PROSTATE,BASAL CELL-FOREHEAD, RIGHT EYE-SQUAMOUS CELL RIGHT EAR    PAST SURGICAL HISTORY: Past Surgical History  Procedure Laterality Date  . Prostatectomy    . Colonoscopy    . Eye surgery Bilateral    CATARACTS  . Penile prosthesis implant    . Toe surgery    . Parotidectomy Right 07/21/2015    Procedure: PAROTIDECTOMY;  Surgeon: Margaretha Sheffield, MD;  Location: [facility] ORS;  Service: ENT;  Laterality: Right;    FAMILY HISTORY: Reviewed and unchanged. No reported history of malignancy or chronic disease.     ADVANCED DIRECTIVES:    HEALTH MAINTENANCE: Social History  Substance Use Topics  . Smoking status: Never Smoker   . Smokeless tobacco: Not on file  . Alcohol Use: No     Colonoscopy:  PAP:  Bone density:  Lipid panel:  Allergies  Allergen Reactions  . Codeine Other (See Comments)    Hallucinations.  Freida Busman [Terbinafine Hcl] Rash  . Penicillins Rash    Current Outpatient Prescriptions  Medication Sig Dispense Refill  . amLODipine (NORVASC) 5 MG tablet Take 5 mg by mouth every morning.     . desvenlafaxine (PRISTIQ) 50 MG 24 hr tablet Take 50 mg by mouth every morning.     . testosterone (ANDRODERM) 4 MG/24HR PT24 patch Place 1 patch onto the skin daily.    . traZODone (DESYREL) 150 MG tablet Take by mouth at bedtime.     No current facility-administered medications for this visit.    OBJECTIVE: Filed Vitals:   08/07/15 1351  BP: 137/70  Pulse: 65  Temp: 97.9 F (36.6 C)  Resp: 18     Body mass index is 33.14 kg/(m^2).    ECOG FS:0 - Asymptomatic  General: Well-developed, well-nourished, no acute distress. Eyes: Pink conjunctiva, anicteric sclera.  HEENT: Normocephalic, moist mucous membranes, clear oropharnyx. Well-healed surgical scar on right neck. Lungs: Clear to auscultation bilaterally. Heart: Regular rate and rhythm. No rubs, murmurs, or gallops. Abdomen: Soft, nontender, nondistended. No organomegaly noted, normoactive bowel sounds. Musculoskeletal: No edema, cyanosis, or clubbing. Neuro: Alert, answering all questions appropriately. Cranial nerves grossly intact. Skin: No rashes or petechiae noted. Psych: Normal affect. Lymphatics: No cervical,  calvicular, axillary or inguinal LAD.   LAB RESULTS:  Lab Results  Component Value Date   NA 136 01/30/2011   K 3.9 01/30/2011   CL 99 01/30/2011   CO2 26 01/30/2011   GLUCOSE 122* 01/30/2011   BUN 12 06/30/2015   CREATININE 1.09 06/30/2015   CALCIUM 9.3 01/30/2011   GFRNONAA >60 06/30/2015   GFRAA >60 06/30/2015    Lab Results  Component Value Date   WBC 5.2 01/30/2011   HGB 14.4 01/30/2011   HCT 39.4 01/30/2011   MCV 89.3 01/30/2011   PLT 176 01/30/2011     STUDIES: Nm Pet Image Initial (pi) Skull Base To Thigh  08/06/2015  CLINICAL DATA:  Initial treatment strategy for high-grade neuroendocrine carcinoma. RIGHT neck tumor surgically removed. EXAM: NUCLEAR MEDICINE PET SKULL BASE TO THIGH TECHNIQUE: 11.9 mCi F-18 FDG was injected intravenously. Full-ring PET imaging was performed from the skull base to thigh after the radiotracer. CT data was obtained and used for attenuation correction and anatomic localization. FASTING BLOOD GLUCOSE:  Value: 110 mg/dl COMPARISON:  CT 06/30/2015 FINDINGS: NECK There is minimal metabolic activity at the surgical resection site posterior to the RIGHT parotid gland. This activity is felt to be within normal limits for postsurgical inflammation. There are no hypermetabolic lymph nodes in the neck. Physiologic symmetric pharyngeal mucosal activity noted. CHEST No hypermetabolic mediastinal or hilar nodes. No suspicious pulmonary nodules on the CT scan. ABDOMEN/PELVIS Single intensely hypermetabolic periportal lymph node with a rounded morphology measuring 19 by 19 mm (image 153, series 3) and intense metabolic activity (SUV max equal 14.6). No additional hypermetabolic lymph nodes in the abdomen or pelvis. No abnormal metabolic activity liver spleen. Adrenal glands and kidneys are normal. Gallstones within lumen of the gallbladder noted. No abnormal metabolic activity within the bowel. There is diverticulosis of the sigmoid colon. Abnormal metabolic  activity associated the bladder. SKELETON No focal hypermetabolic activity to suggest skeletal metastasis. IMPRESSION: 1. Mild postsurgical metabolic inflammation at the surgical site adjacent the RIGHT parotid gland. No evidence of metastatic disease in the head or neck. 2. 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 3. No primary site identified other than the excised RIGHT parotid lesion. Electronically Signed   By: Suzy Bouchard M.D.   On: 08/06/2015 10:35    ASSESSMENT: Neuroendocrine tumor, consistent with small cell carcinoma.  PLAN:    1. Neuroendocrine tumor: Pathology and imaging results reviewed independently. PET scan results reviewed independently and reported as above. Case was also discussed at cancer conference earlier today. Patient noted to have an intensely hyperbolic periportal lymph node concerning for metastatic disease. Because of the unusual location of disease, patient will undergo EUS at [facility] for further evaluation of his periportal lymph node and biopsy. Return to clinic approximately one week after his EUS to discuss the results and further treatment planning.  Approximately 30 minutes was spent in discussion of which greater than 50% was consultation.  Patient expressed understanding and was in agreement with this plan. He also understands that He can call clinic at any time with  any questions, concerns, or complaints.    Lloyd Huger, MD   08/11/2015 8:13 AM

## 2015-08-12 NOTE — Progress Notes (Signed)
  Oncology Nurse Navigator Documentation  Navigator Location: [facility] (08/12/15 1700)                                            Time Spent with Patient: 15 (08/12/15 1700)   EUS has been scheduled for 08/14/15 with Dr Francella Solian

## 2015-08-19 NOTE — Progress Notes (Signed)
  Oncology Nurse Navigator Documentation  Navigator Location: [facility] (08/19/15 1500) Navigator Encounter Type: Diagnostic Results (08/19/15 1500)                                          Time Spent with Patient: 15 (08/19/15 1500)   Pathology from EUS available and provided to Dr Grayland Ormond.

## 2015-08-20 ENCOUNTER — Inpatient Hospital Stay: Payer: Medicare Other | Attending: Oncology | Admitting: Oncology

## 2015-08-20 VITALS — BP 116/69 | HR 65 | Temp 97.8°F | Resp 16 | Wt 234.3 lb

## 2015-08-20 DIAGNOSIS — Z8546 Personal history of malignant neoplasm of prostate: Secondary | ICD-10-CM | POA: Insufficient documentation

## 2015-08-20 DIAGNOSIS — Z85828 Personal history of other malignant neoplasm of skin: Secondary | ICD-10-CM | POA: Diagnosis not present

## 2015-08-20 DIAGNOSIS — N3289 Other specified disorders of bladder: Secondary | ICD-10-CM | POA: Diagnosis not present

## 2015-08-20 DIAGNOSIS — I10 Essential (primary) hypertension: Secondary | ICD-10-CM | POA: Diagnosis not present

## 2015-08-20 DIAGNOSIS — G4730 Sleep apnea, unspecified: Secondary | ICD-10-CM | POA: Diagnosis not present

## 2015-08-20 DIAGNOSIS — F329 Major depressive disorder, single episode, unspecified: Secondary | ICD-10-CM | POA: Insufficient documentation

## 2015-08-20 DIAGNOSIS — D3A8 Other benign neuroendocrine tumors: Secondary | ICD-10-CM

## 2015-08-20 DIAGNOSIS — C7A1 Malignant poorly differentiated neuroendocrine tumors: Secondary | ICD-10-CM | POA: Diagnosis not present

## 2015-08-20 DIAGNOSIS — K219 Gastro-esophageal reflux disease without esophagitis: Secondary | ICD-10-CM | POA: Diagnosis not present

## 2015-08-20 DIAGNOSIS — K5730 Diverticulosis of large intestine without perforation or abscess without bleeding: Secondary | ICD-10-CM | POA: Insufficient documentation

## 2015-08-20 DIAGNOSIS — Z79899 Other long term (current) drug therapy: Secondary | ICD-10-CM | POA: Insufficient documentation

## 2015-08-20 DIAGNOSIS — Z5111 Encounter for antineoplastic chemotherapy: Secondary | ICD-10-CM | POA: Insufficient documentation

## 2015-08-20 DIAGNOSIS — R591 Generalized enlarged lymph nodes: Secondary | ICD-10-CM | POA: Insufficient documentation

## 2015-08-22 ENCOUNTER — Ambulatory Visit
Admission: RE | Admit: 2015-08-22 | Discharge: 2015-08-22 | Disposition: A | Discharge status: Home / Self Care | Payer: Medicare Other | Source: Ambulatory Visit | Attending: Oncology | Admitting: Oncology

## 2015-08-22 DIAGNOSIS — D3A8 Other benign neuroendocrine tumors: Secondary | ICD-10-CM

## 2015-08-22 DIAGNOSIS — D489 Neoplasm of uncertain behavior, unspecified: Secondary | ICD-10-CM | POA: Insufficient documentation

## 2015-08-22 MED ORDER — GADOBENATE DIMEGLUMINE 529 MG/ML IV SOLN
20.0000 mL | Freq: Once | INTRAVENOUS | Status: AC | PRN
  Administered 2015-08-22: 20 mL via INTRAVENOUS

## 2015-08-22 NOTE — Patient Instructions (Signed)

## 2015-08-24 DIAGNOSIS — C7A1 Malignant poorly differentiated neuroendocrine tumors: Secondary | ICD-10-CM | POA: Insufficient documentation

## 2015-08-24 MED ORDER — PROCHLORPERAZINE MALEATE 10 MG PO TABS: 10.0000 mg | ORAL_TABLET | Freq: Four times a day (QID) | ORAL | Status: DC | PRN

## 2015-08-24 MED ORDER — ONDANSETRON HCL 8 MG PO TABS: 8.0000 mg | ORAL_TABLET | Freq: Two times a day (BID) | ORAL | Status: DC | PRN

## 2015-08-24 NOTE — Progress Notes (Signed)
[facility]  [phone] [phone]  ID: Alex Underwood OB: 28-Dec-1945  MR#: [id number]  WVP#:[id number]  Patient Care Team: Kirk Ruths, MD as PCP - General (Internal Medicine)  CHIEF COMPLAINT:  Chief Complaint  Patient presents with  . Neuroendocrine tumor  . Results    INTERVAL HISTORY: Patient returns to clinic today for further evaluation, discussion of his most recent biopsy, and treatment planning. He continues to feel well and is asymptomatic.  He has no neurologic complaints. He denies any recent fevers or illnesses. He has a good appetite and denies weight loss. He denies any pain. He denies any chest pain or shortness of breath. He denies any nausea, vomiting, constipation, or diarrhea. He has no urinary complaints. Patient offers no specific complaints today.  REVIEW OF SYSTEMS:   Review of Systems  Constitutional: Negative.  Negative for fever, weight loss and malaise/fatigue.  HENT: Negative.  Negative for sore throat.   Respiratory: Negative.  Negative for hemoptysis and shortness of breath.   Cardiovascular: Negative.  Negative for chest pain.  Gastrointestinal: Negative.  Negative for nausea, vomiting, abdominal pain, diarrhea, constipation, blood in stool and melena.  Genitourinary: Negative.   Musculoskeletal: Negative.   Neurological: Negative.  Negative for weakness and headaches.  Psychiatric/Behavioral: Negative.     As per HPI. Otherwise, a complete review of systems is negatve.  PAST MEDICAL HISTORY: Past Medical History  Diagnosis Date  . Depression     Since 04/12  . Hypertension   . Sleep apnea     USES CPAP  . GERD (gastroesophageal reflux disease)     H/O  . Cancer ([place])     PROSTATE,BASAL CELL-FOREHEAD, RIGHT EYE-SQUAMOUS CELL RIGHT EAR    PAST SURGICAL HISTORY: Past Surgical History  Procedure Laterality Date  . Prostatectomy    . Colonoscopy    . Eye surgery Bilateral     CATARACTS  .  Penile prosthesis implant    . Toe surgery    . Parotidectomy Right 07/21/2015    Procedure: PAROTIDECTOMY;  Surgeon: Margaretha Sheffield, MD;  Location: [facility] ORS;  Service: ENT;  Laterality: Right;    FAMILY HISTORY: Reviewed and unchanged. No reported history of malignancy or chronic disease.     ADVANCED DIRECTIVES:    HEALTH MAINTENANCE: Social History  Substance Use Topics  . Smoking status: Never Smoker   . Smokeless tobacco: Not on file  . Alcohol Use: No     Colonoscopy:  PAP:  Bone density:  Lipid panel:  Allergies  Allergen Reactions  . Codeine Other (See Comments)    Hallucinations.  Freida Busman [Terbinafine Hcl] Rash  . Penicillins Rash    Current Outpatient Prescriptions  Medication Sig Dispense Refill  . amLODipine (NORVASC) 5 MG tablet Take 5 mg by mouth every morning.     . desvenlafaxine (PRISTIQ) 50 MG 24 hr tablet Take 50 mg by mouth every morning.     . testosterone (ANDRODERM) 4 MG/24HR PT24 patch Place 1 patch onto the skin daily.    . traZODone (DESYREL) 150 MG tablet Take by mouth at bedtime.    . ondansetron (ZOFRAN) 8 MG tablet Take 1 tablet (8 mg total) by mouth 2 (two) times daily as needed for refractory nausea / vomiting. Start on day 3 after carboplatin chemo. 30 tablet 1  . prochlorperazine (COMPAZINE) 10 MG tablet Take 1 tablet (10 mg total) by mouth every 6 (six) hours as needed (Nausea or vomiting). 30 tablet 1  No current facility-administered medications for this visit.    OBJECTIVE: Filed Vitals:   08/20/15 0922  BP: 116/69  Pulse: 65  Temp: 97.8 F (36.6 C)  Resp: 16     Body mass index is 33.55 kg/(m^2).    ECOG FS:0 - Asymptomatic  General: Well-developed, well-nourished, no acute distress. Eyes: Pink conjunctiva, anicteric sclera. HEENT: Normocephalic, moist mucous membranes, clear oropharnyx. Well-healed surgical scar on right neck. Lungs: Clear to auscultation bilaterally. Heart: Regular rate and rhythm. No rubs, murmurs, or  gallops. Abdomen: Soft, nontender, nondistended. No organomegaly noted, normoactive bowel sounds. Musculoskeletal: No edema, cyanosis, or clubbing. Neuro: Alert, answering all questions appropriately. Cranial nerves grossly intact. Skin: No rashes or petechiae noted. Psych: Normal affect. Lymphatics: No cervical, calvicular, axillary or inguinal LAD.   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 Question brain metastases. EXAM: MRI HEAD WITHOUT AND WITH CONTRAST TECHNIQUE: Multiplanar, multiecho pulse sequences of the brain and surrounding structures were obtained without and with intravenous contrast. CONTRAST:  83m MULTIHANCE GADOBENATE DIMEGLUMINE 529 MG/ML IV SOLN COMPARISON:  None. FINDINGS: Postoperative changes of the right parotid gland are noted. The study is not tailored to evaluate for local tumor at the parotid gland as the parotid is incompletely imaged. The visualized residual parotid tissue is within normal limits. There is postsurgical changes extending into the region of the deep lobe which may represent scar tissue. There is no abnormal signal within the mandible. No acute infarct, hemorrhage, or mass lesion is present. No pathologic enhancement is present. No significant white matter disease is present. Internal auditory canals are within normal limits. The brainstem and cerebellum are normal. Flow is present in the  major intracranial arteries. The globes and orbits are intact. The paranasal sinuses are clear. Fluid is present in the right mastoid air cells. No obstructing nasopharyngeal lesion is present. IMPRESSION: 1. Postoperative changes at the level of the right parotid gland. There is some tissue within the deep space. This likely represents scar tissue. Please see the discussion above. 2. Right mastoid effusion likely related to local treatment affects. There is no definite enhancement or evidence for perineural spread of tumor. 3. Otherwise normal MRI appearance the brain without evidence for intracranial metastases. Electronically Signed   By: CSan MorelleM.D.   On: 02017/08/2210:05   Nm Pet Image Initial (pi) Skull Base To Thigh  08/06/2015  CLINICAL DATA:  Initial treatment strategy for high-grade neuroendocrine carcinoma. RIGHT neck tumor surgically removed. EXAM: NUCLEAR MEDICINE PET SKULL BASE TO THIGH TECHNIQUE: 11.9 mCi F-18 FDG was injected intravenously. Full-ring PET imaging was performed from the skull base to thigh after the radiotracer. CT data was obtained and used for attenuation correction and anatomic localization. FASTING BLOOD GLUCOSE:  Value: 110 mg/dl COMPARISON:  CT 06/30/2015 FINDINGS: NECK There is minimal metabolic  activity at the surgical resection site posterior to the RIGHT parotid gland. This activity is felt to be within normal limits for postsurgical inflammation. There are no hypermetabolic lymph nodes in the neck. Physiologic symmetric pharyngeal mucosal activity noted. CHEST No hypermetabolic mediastinal or hilar nodes. No suspicious pulmonary nodules on the CT scan. ABDOMEN/PELVIS Single intensely hypermetabolic periportal lymph node with a rounded morphology measuring 19 by 19 mm (image 153, series 3) and intense metabolic activity (SUV max equal 14.6). No additional hypermetabolic lymph nodes in the abdomen or pelvis. No abnormal metabolic activity liver spleen. Adrenal  glands and kidneys are normal. Gallstones within lumen of the gallbladder noted. No abnormal metabolic activity within the bowel. There is diverticulosis of the sigmoid colon. Abnormal metabolic activity associated the bladder. SKELETON No focal hypermetabolic activity to suggest skeletal metastasis. IMPRESSION: 1. Mild postsurgical metabolic inflammation at the surgical site adjacent the RIGHT parotid gland. No evidence of metastatic disease in the head or neck. 2. 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 3. No primary site identified other than the excised RIGHT parotid lesion. Electronically Signed   By: Suzy Bouchard M.D.   On: 08/06/2015 10:35    ASSESSMENT: Neuroendocrine tumor, consistent with small cell carcinoma.  PLAN:    1. Neuroendocrine tumor: Pathology and imaging results reviewed independently. PET scan results reviewed independently and reported as above. EUS biopsy of lymph node and porta hepatis revealed a low-grade neuroendocrine tumor slightly different than the high-grade/small cell carcinoma noted in his parotid gland. MRI the brain reviewed independently without metastatic disease. After lengthy discussion with the patient, he expressed understanding that he is technically stage IV disease and wishes to pursue chemotherapy. Plan on giving carboplatinum on day 1 with etoposide on days 1, 2 and 3 on a 21 day cycle. Plan to give 3 cycles. Will add in Neulasta support after XRT is completed.  Patient will also have consultation with radiation oncology for local treatment and control of his parotid high-grade neuroendocrine tumor. Return to clinic on August 27, 2015 to initiate cycle 1.    Approximately 30 minutes was spent in discussion of which greater than 50% was consultation.  Patient expressed understanding and was in agreement with this plan. He also understands that He can call clinic at any time with any questions,  concerns, or complaints.    Lloyd Huger, MD   08/24/2015 5:33 PM

## 2015-08-26 ENCOUNTER — Ambulatory Visit
Admission: RE | Admit: 2015-08-26 | Discharge: 2015-08-26 | Disposition: A | Discharge status: Home / Self Care | Payer: Medicare Other | Source: Ambulatory Visit | Attending: Radiation Oncology | Admitting: Radiation Oncology

## 2015-08-26 ENCOUNTER — Other Ambulatory Visit: Payer: Self-pay | Admitting: Oncology

## 2015-08-26 ENCOUNTER — Inpatient Hospital Stay: Payer: Medicare Other

## 2015-08-26 ENCOUNTER — Encounter: Payer: Self-pay | Admitting: Radiation Oncology

## 2015-08-26 VITALS — BP 125/69 | HR 60 | Temp 98.0°F | Wt 234.5 lb

## 2015-08-26 DIAGNOSIS — G4730 Sleep apnea, unspecified: Secondary | ICD-10-CM | POA: Diagnosis not present

## 2015-08-26 DIAGNOSIS — I10 Essential (primary) hypertension: Secondary | ICD-10-CM | POA: Insufficient documentation

## 2015-08-26 DIAGNOSIS — Z510 Encounter for antineoplastic radiation therapy: Secondary | ICD-10-CM | POA: Insufficient documentation

## 2015-08-26 DIAGNOSIS — C07 Malignant neoplasm of parotid gland: Secondary | ICD-10-CM | POA: Insufficient documentation

## 2015-08-26 DIAGNOSIS — Z79899 Other long term (current) drug therapy: Secondary | ICD-10-CM | POA: Insufficient documentation

## 2015-08-26 DIAGNOSIS — K219 Gastro-esophageal reflux disease without esophagitis: Secondary | ICD-10-CM | POA: Diagnosis not present

## 2015-08-26 DIAGNOSIS — F329 Major depressive disorder, single episode, unspecified: Secondary | ICD-10-CM | POA: Diagnosis not present

## 2015-08-26 DIAGNOSIS — C7A1 Malignant poorly differentiated neuroendocrine tumors: Secondary | ICD-10-CM

## 2015-08-26 NOTE — Consult Note (Signed)
Except an outstanding is perfect of Radiation Oncology NEW PATIENT EVALUATION  Name: Alex Underwood  MRN: [id number]  Date:   08/26/2015     DOB: 09-May-1945   This 70 y.o. 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  REFERRING PHYSICIAN: Kirk Ruths, MD  CHIEF COMPLAINT:  Chief Complaint  Patient presents with  . 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 Initial CT scan shows soft tissue mass in the right parotid measuring 4 x 2.5 cm favoring a Warthin's tumor. He went on to have surgical excision showing right prior a deck to me with high-grade neuroendocrine carcinoma invading the skeletal muscle with deep margin positive angiolymphatic invasion present consistent with small cell carcinoma. PET scan was performed showing post metabolic inflammation at the surgical site of the right parotid gland there is also single intensely hypermetabolic focus in the periportal lymph node area concerning for metastatic small cell carcinoma. This area was biopsied also showed different histology a more well-differentiated neuroendocrine tumor. No other site for primary small cell lung cancer was noted. Case was presented at weekly tumor conference recommendation for chemotherapy plus involved field radiation was recommended. Patient is seen today and is doing well. 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 MRI of the  brain was unremarkable  PLANNED TREATMENT REGIMEN: I MRT radiation therapy to right parotid bed and right neck  PAST MEDICAL HISTORY:  has a past medical history of Depression; Hypertension; Sleep apnea; GERD (gastroesophageal reflux disease); and Cancer ([place]).    PAST SURGICAL HISTORY:  Past Surgical History  Procedure Laterality Date  . Prostatectomy    . Colonoscopy    . Eye surgery Bilateral     CATARACTS  . Penile prosthesis implant    . Toe surgery    . Parotidectomy Right 07/21/2015    Procedure: PAROTIDECTOMY;  Surgeon: Margaretha Sheffield, MD;  Location: [facility] ORS;  Service: ENT;  Laterality: Right;    FAMILY HISTORY: family history is not on file.  SOCIAL HISTORY:  reports that he has never smoked. He does not have any smokeless tobacco history on file. He reports that he does not drink alcohol or use illicit drugs.  ALLERGIES: Codeine; Lamisil; and Penicillins  MEDICATIONS:  Current Outpatient Prescriptions  Medication Sig Dispense Refill  . amLODipine (NORVASC) 5 MG tablet Take 5 mg by mouth every morning.     . desvenlafaxine (PRISTIQ) 50 MG 24 hr tablet Take 50 mg by mouth every morning.     . ondansetron (ZOFRAN) 8 MG tablet Take 1 tablet (8 mg total) by mouth 2 (two) times daily as needed for refractory nausea / vomiting. Start on day 3 after carboplatin chemo. 30 tablet 1  . testosterone (ANDRODERM) 4 MG/24HR PT24 patch Place 1 patch onto the skin daily.    . traZODone (DESYREL) 150 MG tablet Take by mouth at bedtime.    . prochlorperazine (COMPAZINE) 10 MG tablet Take 1 tablet (10  mg total) by mouth every 6 (six) hours as needed (Nausea or vomiting). (Patient not taking: Reported on 08/26/2015) 30 tablet 1   No current facility-administered medications for this encounter.    ECOG PERFORMANCE STATUS:  0 - Asymptomatic  REVIEW OF SYSTEMS:  Patient denies any weight loss, fatigue, weakness, fever, chills or night sweats. Patient denies any loss of vision, blurred vision.  Patient denies any ringing  of the ears or hearing loss. No irregular heartbeat. Patient denies heart murmur or history of fainting. Patient denies any chest pain or pain radiating to her upper extremities. Patient denies any shortness of breath, difficulty breathing at night, cough or hemoptysis. Patient denies any swelling in the lower legs. Patient denies any nausea vomiting, vomiting of blood, or coffee ground material in the vomitus. Patient denies any stomach pain. Patient states has had normal bowel movements no significant constipation or diarrhea. Patient denies any dysuria, hematuria or significant nocturia. Patient denies any problems walking, swelling in the joints or loss of balance. Patient denies any skin changes, loss of hair or loss of weight. Patient denies any excessive worrying or anxiety or significant depression. Patient denies any problems with insomnia. Patient denies excessive thirst, polyuria, polydipsia. Patient denies any swollen glands, patient denies easy bruising or easy bleeding. Patient denies any recent infections, allergies or URI. Patient "s visual fields have not changed significantly in recent time.    PHYSICAL EXAM: BP 125/69 mmHg  Pulse 60  Temp(Src) 98 F (36.7 C)  Wt 234 lb 7.4 oz (106.35 kg) Oral cavity is clear patient has some problems with movement of his oral tongue making and direct mirror examination impossible. No oral mucosal lesions are identified. He's does have some persistent swelling in the right parotid bed secondary to surgery. No evidence of gross adenopathy in the neck or supra clavicular region is identified. Well-developed well-nourished patient in NAD. HEENT reveals PERLA, EOMI, discs not visualized.  Oral cavity is clear. No oral mucosal lesions are identified. Neck is clear without evidence of cervical or supraclavicular adenopathy. Lungs are clear to A&P. Cardiac examination is essentially unremarkable with regular rate and rhythm without  murmur rub or thrill. Abdomen is benign with no organomegaly or masses noted. Motor sensory and DTR levels are equal and symmetric in the upper and lower extremities. Cranial nerves II through XII are grossly intact. Proprioception is intact. No peripheral adenopathy or edema is identified. No motor or sensory levels are noted. Crude visual fields are within normal range.  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 Would treat those areas to 6000 cGy over 6 weeks using IM RT radiation therapy treatment planning and delivery. Would use I MRT to spare critical structures such as contralateral salivary gland spinal cord oral cavity and eyes. Risks and benefits of treatment including possible alteration of taste possible dysphasia sore throat alteration of blood counts skin changes all were reviewed in detail with the patient. Patient is also starting systemic chemotherapy under medical oncology's direction.There will be extra effort by both professional staff as well as technical staff to coordinate and manage concurrent chemoradiation and ensuing side effects during his treatments.  I personally ordered CT simulation for early next week. Patient and wife both seem to comprehend my treatment plan well.  I would like to take this opportunity to thank you for allowing me to participate in the care of your patient.Armstead Peaks., MD

## 2015-08-26 NOTE — Progress Notes (Signed)
Written and verbal information given regarding side effects, lab appointments and hygiene.  Patient and wife verbalized understanding of all information; questions answered to their satisfaction.  15 minutes spent  Going over education

## 2015-08-27 ENCOUNTER — Inpatient Hospital Stay: Payer: Medicare Other

## 2015-08-27 ENCOUNTER — Inpatient Hospital Stay (HOSPITAL_BASED_OUTPATIENT_CLINIC_OR_DEPARTMENT_OTHER): Payer: Medicare Other | Admitting: Oncology

## 2015-08-27 VITALS — BP 115/69 | HR 62 | Temp 95.0°F | Resp 18 | Wt 234.3 lb

## 2015-08-27 VITALS — BP 110/65 | HR 54 | Resp 20

## 2015-08-27 DIAGNOSIS — K219 Gastro-esophageal reflux disease without esophagitis: Secondary | ICD-10-CM

## 2015-08-27 DIAGNOSIS — C7A1 Malignant poorly differentiated neuroendocrine tumors: Secondary | ICD-10-CM | POA: Diagnosis not present

## 2015-08-27 DIAGNOSIS — Z85828 Personal history of other malignant neoplasm of skin: Secondary | ICD-10-CM

## 2015-08-27 DIAGNOSIS — R591 Generalized enlarged lymph nodes: Secondary | ICD-10-CM

## 2015-08-27 DIAGNOSIS — Z8546 Personal history of malignant neoplasm of prostate: Secondary | ICD-10-CM

## 2015-08-27 DIAGNOSIS — G4730 Sleep apnea, unspecified: Secondary | ICD-10-CM | POA: Diagnosis not present

## 2015-08-27 DIAGNOSIS — F329 Major depressive disorder, single episode, unspecified: Secondary | ICD-10-CM | POA: Diagnosis not present

## 2015-08-27 DIAGNOSIS — K5730 Diverticulosis of large intestine without perforation or abscess without bleeding: Secondary | ICD-10-CM

## 2015-08-27 DIAGNOSIS — I10 Essential (primary) hypertension: Secondary | ICD-10-CM | POA: Diagnosis not present

## 2015-08-27 DIAGNOSIS — Z5111 Encounter for antineoplastic chemotherapy: Secondary | ICD-10-CM | POA: Diagnosis not present

## 2015-08-27 DIAGNOSIS — Z79899 Other long term (current) drug therapy: Secondary | ICD-10-CM

## 2015-08-27 DIAGNOSIS — N3289 Other specified disorders of bladder: Secondary | ICD-10-CM

## 2015-08-27 LAB — CBC WITH DIFFERENTIAL/PLATELET
BASOS ABS: 0 10*3/uL (ref 0–0.1)
BASOS PCT: 0 %
EOS PCT: 3 %
Eosinophils Absolute: 0.1 10*3/uL (ref 0–0.7)
HCT: 40.6 % (ref 40.0–52.0)
HEMOGLOBIN: 14.4 g/dL (ref 13.0–18.0)
Lymphocytes Relative: 23 %
Lymphs Abs: 1.1 10*3/uL (ref 1.0–3.6)
MCH: 31.2 pg (ref 26.0–34.0)
MCHC: 35.5 g/dL (ref 32.0–36.0)
MCV: 87.8 fL (ref 80.0–100.0)
MONO ABS: 0.4 10*3/uL (ref 0.2–1.0)
Monocytes Relative: 8 %
Neutro Abs: 3.1 10*3/uL (ref 1.4–6.5)
Neutrophils Relative %: 66 %
Platelets: 194 10*3/uL (ref 150–440)
RBC: 4.62 MIL/uL (ref 4.40–5.90)
RDW: 13.4 % (ref 11.5–14.5)
WBC: 4.6 10*3/uL (ref 3.8–10.6)

## 2015-08-27 LAB — COMPREHENSIVE METABOLIC PANEL
ALK PHOS: 79 U/L (ref 38–126)
ALT: 24 U/L (ref 17–63)
ANION GAP: 7 (ref 5–15)
AST: 26 U/L (ref 15–41)
Albumin: 4.3 g/dL (ref 3.5–5.0)
BILIRUBIN TOTAL: 0.8 mg/dL (ref 0.3–1.2)
BUN: 12 mg/dL (ref 6–20)
CALCIUM: 9 mg/dL (ref 8.9–10.3)
CO2: 26 mmol/L (ref 22–32)
Chloride: 99 mmol/L — ABNORMAL LOW (ref 101–111)
Creatinine, Ser: 1.06 mg/dL (ref 0.61–1.24)
GFR calc non Af Amer: >60 mL/min (ref >60)
Glucose, Bld: 135 mg/dL — ABNORMAL HIGH (ref 65–99)
Potassium: 3.8 mmol/L (ref 3.5–5.1)
SODIUM: 132 mmol/L — AB (ref 135–145)
TOTAL PROTEIN: 7.4 g/dL (ref 6.5–8.1)

## 2015-08-27 MED ORDER — ETOPOSIDE CHEMO INJECTION 1 GM/50ML
100.0000 mg/m2 | Freq: Once | INTRAVENOUS | Status: AC
  Administered 2015-08-27: 230 mg via INTRAVENOUS
  Filled 2015-08-27: qty 11.5

## 2015-08-27 MED ORDER — SODIUM CHLORIDE 0.9 % IV SOLN
Freq: Once | INTRAVENOUS | Status: AC
  Administered 2015-08-27: 10:00:00 via INTRAVENOUS
  Filled 2015-08-27: qty 1000

## 2015-08-27 MED ORDER — PALONOSETRON HCL INJECTION 0.25 MG/5ML
0.2500 mg | Freq: Once | INTRAVENOUS | Status: AC
  Administered 2015-08-27: 0.25 mg via INTRAVENOUS
  Filled 2015-08-27: qty 5

## 2015-08-27 MED ORDER — SODIUM CHLORIDE 0.9 % IV SOLN
10.0000 mg | Freq: Once | INTRAVENOUS | Status: AC
  Administered 2015-08-27: 10 mg via INTRAVENOUS
  Filled 2015-08-27: qty 1

## 2015-08-27 MED ORDER — SODIUM CHLORIDE 0.9 % IV SOLN
612.5000 mg | Freq: Once | INTRAVENOUS | Status: AC
  Administered 2015-08-27: 610 mg via INTRAVENOUS
  Filled 2015-08-27: qty 61

## 2015-08-27 NOTE — Progress Notes (Signed)
States is feeling well. Offers no complaints. 

## 2015-08-28 ENCOUNTER — Inpatient Hospital Stay: Payer: Medicare Other

## 2015-08-28 VITALS — BP 112/63 | HR 50 | Temp 97.9°F | Resp 18

## 2015-08-28 DIAGNOSIS — Z5111 Encounter for antineoplastic chemotherapy: Secondary | ICD-10-CM | POA: Diagnosis not present

## 2015-08-28 DIAGNOSIS — C7A1 Malignant poorly differentiated neuroendocrine tumors: Secondary | ICD-10-CM

## 2015-08-28 MED ORDER — SODIUM CHLORIDE 0.9 % IV SOLN
10.0000 mg | Freq: Once | INTRAVENOUS | Status: AC
  Administered 2015-08-28: 10 mg via INTRAVENOUS
  Filled 2015-08-28: qty 1

## 2015-08-28 MED ORDER — ETOPOSIDE CHEMO INJECTION 1 GM/50ML
100.0000 mg/m2 | Freq: Once | INTRAVENOUS | Status: AC
  Administered 2015-08-28: 230 mg via INTRAVENOUS
  Filled 2015-08-28: qty 11.5

## 2015-08-28 MED ORDER — SODIUM CHLORIDE 0.9 % IV SOLN
Freq: Once | INTRAVENOUS | Status: AC
  Administered 2015-08-28: 14:00:00 via INTRAVENOUS
  Filled 2015-08-28: qty 1000

## 2015-08-29 ENCOUNTER — Inpatient Hospital Stay: Payer: Medicare Other

## 2015-08-29 VITALS — BP 126/66 | HR 50 | Resp 20

## 2015-08-29 DIAGNOSIS — Z5111 Encounter for antineoplastic chemotherapy: Secondary | ICD-10-CM | POA: Diagnosis not present

## 2015-08-29 DIAGNOSIS — C7A1 Malignant poorly differentiated neuroendocrine tumors: Secondary | ICD-10-CM

## 2015-08-29 MED ORDER — ETOPOSIDE CHEMO INJECTION 1 GM/50ML
100.0000 mg/m2 | Freq: Once | INTRAVENOUS | Status: AC
  Administered 2015-08-29: 230 mg via INTRAVENOUS
  Filled 2015-08-29: qty 11.5

## 2015-08-29 MED ORDER — DEXAMETHASONE SODIUM PHOSPHATE 100 MG/10ML IJ SOLN
10.0000 mg | Freq: Once | INTRAMUSCULAR | Status: AC
  Administered 2015-08-29: 10 mg via INTRAVENOUS
  Filled 2015-08-29: qty 1

## 2015-08-29 MED ORDER — SODIUM CHLORIDE 0.9 % IV SOLN
Freq: Once | INTRAVENOUS | Status: AC
  Administered 2015-08-29: 14:00:00 via INTRAVENOUS
  Filled 2015-08-29: qty 1000

## 2015-09-01 ENCOUNTER — Ambulatory Visit
Admission: RE | Admit: 2015-09-01 | Discharge: 2015-09-01 | Disposition: A | Discharge status: Home / Self Care | Payer: Medicare Other | Source: Ambulatory Visit | Attending: Radiation Oncology | Admitting: Radiation Oncology

## 2015-09-01 DIAGNOSIS — Z510 Encounter for antineoplastic radiation therapy: Secondary | ICD-10-CM | POA: Diagnosis not present

## 2015-09-02 NOTE — Progress Notes (Signed)
[place]  Telephone:(336) [phone] Fax:(336) [phone]  ID: RODERIC LAMMERT OB: [birth year]  MR#: [id number]  DZH#:[id number]  Patient Care Team: Kirk Ruths, MD as PCP - General (Internal Medicine)  CHIEF COMPLAINT:  Chief Complaint  Patient presents with  . neuroendocrine tumor    INTERVAL HISTORY: Patient returns to clinic today for further evaluation And initiate cycle 1 of 3 of carboplatinum and etoposide. He continues to feel well and is asymptomatic.  He has no neurologic complaints. He denies any recent fevers or illnesses. He has a good appetite and denies weight loss. He denies any pain. He denies any chest pain or shortness of breath. He denies any nausea, vomiting, constipation, or diarrhea. He has no urinary complaints. Patient offers no specific complaints today.  REVIEW OF SYSTEMS:   Review of Systems  Constitutional: Negative.  Negative for fever, weight loss and malaise/fatigue.  HENT: Negative.  Negative for sore throat.   Respiratory: Negative.  Negative for hemoptysis and shortness of breath.   Cardiovascular: Negative.  Negative for chest pain.  Gastrointestinal: Negative.  Negative for nausea, vomiting, abdominal pain, diarrhea, constipation, blood in stool and melena.  Genitourinary: Negative.   Musculoskeletal: Negative.   Neurological: Negative.  Negative for weakness and headaches.  Psychiatric/Behavioral: Negative.     As per HPI. Otherwise, a complete review of systems is negatve.  PAST MEDICAL HISTORY: Past Medical History  Diagnosis Date  . Depression     Since 04/12  . Hypertension   . Sleep apnea     USES CPAP  . GERD (gastroesophageal reflux disease)     H/O  . Cancer ([place])     PROSTATE,BASAL CELL-FOREHEAD, RIGHT EYE-SQUAMOUS CELL RIGHT EAR    PAST SURGICAL HISTORY: Past Surgical History  Procedure Laterality Date  . Prostatectomy    . Colonoscopy    . Eye surgery Bilateral     CATARACTS  . Penile  prosthesis implant    . Toe surgery    . Parotidectomy Right 07/21/2015    Procedure: PAROTIDECTOMY;  Surgeon: Margaretha Sheffield, MD;  Location: [facility] ORS;  Service: ENT;  Laterality: Right;    FAMILY HISTORY: Reviewed and unchanged. No reported history of malignancy or chronic disease.     ADVANCED DIRECTIVES:    HEALTH MAINTENANCE: Social History  Substance Use Topics  . Smoking status: Never Smoker   . Smokeless tobacco: Not on file  . Alcohol Use: No     Colonoscopy:  PAP:  Bone density:  Lipid panel:  Allergies  Allergen Reactions  . Codeine Other (See Comments)    Hallucinations.  Freida Busman [Terbinafine Hcl] Rash  . Penicillins Rash    Current Outpatient Prescriptions  Medication Sig Dispense Refill  . amLODipine (NORVASC) 5 MG tablet Take 5 mg by mouth every morning.     . desvenlafaxine (PRISTIQ) 50 MG 24 hr tablet Take 50 mg by mouth every morning.     . ondansetron (ZOFRAN) 8 MG tablet Take 1 tablet (8 mg total) by mouth 2 (two) times daily as needed for refractory nausea / vomiting. Start on day 3 after carboplatin chemo. 30 tablet 1  . prochlorperazine (COMPAZINE) 10 MG tablet Take 1 tablet (10 mg total) by mouth every 6 (six) hours as needed (Nausea or vomiting). 30 tablet 1  . testosterone (ANDRODERM) 4 MG/24HR PT24 patch Place 1 patch onto the skin daily.    . traZODone (DESYREL) 150 MG tablet Take by mouth at bedtime.     No  current facility-administered medications for this visit.    OBJECTIVE: Filed Vitals:   08/27/15 0847  BP: 115/69  Pulse: 62  Temp: 95 F (35 C)  Resp: 18     Body mass index is 33.55 kg/(m^2).    ECOG FS:0 - Asymptomatic  General: Well-developed, well-nourished, no acute distress. Eyes: Pink conjunctiva, anicteric sclera. HEENT: Normocephalic, moist mucous membranes, clear oropharnyx. Well-healed surgical scar on right neck. Lungs: Clear to auscultation bilaterally. Heart: Regular rate and rhythm. No rubs, murmurs, or  gallops. Abdomen: Soft, nontender, nondistended. No organomegaly noted, normoactive bowel sounds. Musculoskeletal: No edema, cyanosis, or clubbing. Neuro: Alert, answering all questions appropriately. Cranial nerves grossly intact. Skin: No rashes or petechiae noted. Psych: Normal affect. Lymphatics: No cervical, calvicular, axillary or inguinal LAD.   LAB RESULTS:  Lab Results  Component Value Date   NA 132* 08/27/2015   K 3.8 08/27/2015   CL 99* 08/27/2015   CO2 26 08/27/2015   GLUCOSE 135* 08/27/2015   BUN 12 08/27/2015   CREATININE 1.06 08/27/2015   CALCIUM 9.0 08/27/2015   PROT 7.4 08/27/2015   ALBUMIN 4.3 08/27/2015   AST 26 08/27/2015   ALT 24 08/27/2015   ALKPHOS 79 08/27/2015   BILITOT 0.8 08/27/2015   GFRNONAA >60 08/27/2015   GFRAA >60 08/27/2015    Lab Results  Component Value Date   WBC 4.6 08/27/2015   NEUTROABS 3.1 08/27/2015   HGB 14.4 08/27/2015   HCT 40.6 08/27/2015   MCV 87.8 08/27/2015   PLT 194 08/27/2015     STUDIES: Mr Jeri Cos QA Contrast  Sep 10, 2015  CLINICAL DATA:  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 Question brain metastases. EXAM: MRI HEAD WITHOUT AND WITH CONTRAST TECHNIQUE: Multiplanar, multiecho pulse sequences of the brain and surrounding structures were obtained without and with intravenous contrast. CONTRAST:  7m MULTIHANCE GADOBENATE DIMEGLUMINE 529 MG/ML IV SOLN COMPARISON:  None. FINDINGS: Postoperative changes of the right parotid gland are noted. The study is not tailored to evaluate for local tumor at the parotid gland as the parotid is incompletely imaged. The visualized residual parotid tissue is within normal limits. There is postsurgical changes extending into the region of the deep lobe which may represent scar tissue. There is no abnormal signal within the mandible. No acute infarct, hemorrhage, or mass lesion is present. No pathologic enhancement is  present. No significant white matter disease is present. Internal auditory canals are within normal limits. The brainstem and cerebellum are normal. Flow is present in the major intracranial arteries. The globes and orbits are intact. The paranasal sinuses are clear. Fluid is present in the right mastoid air cells. No obstructing nasopharyngeal lesion is present. IMPRESSION: 1. Postoperative changes at the level of the right parotid gland. There is some tissue within the deep space. This likely represents scar tissue. Please see the discussion above. 2. Right mastoid effusion likely related to local treatment affects. There is no definite enhancement or evidence for perineural spread of tumor. 3. Otherwise normal MRI appearance the brain without evidence for intracranial metastases. Electronically Signed   By: CSan MorelleM.D.   On: 006/28/201712:05   Nm Pet Image Initial (pi) Skull Base To Thigh  08/06/2015  CLINICAL DATA:  Initial treatment strategy for high-grade neuroendocrine carcinoma. RIGHT neck tumor surgically removed. EXAM: NUCLEAR MEDICINE PET SKULL BASE TO THIGH TECHNIQUE: 11.9 mCi F-18 FDG was injected intravenously. Full-ring PET imaging was performed from the skull base  to thigh after the radiotracer. CT data was obtained and used for attenuation correction and anatomic localization. FASTING BLOOD GLUCOSE:  Value: 110 mg/dl COMPARISON:  CT 06/30/2015 FINDINGS: NECK There is minimal metabolic activity at the surgical resection site posterior to the RIGHT parotid gland. This activity is felt to be within normal limits for postsurgical inflammation. There are no hypermetabolic lymph nodes in the neck. Physiologic symmetric pharyngeal mucosal activity noted. CHEST No hypermetabolic mediastinal or hilar nodes. No suspicious pulmonary nodules on the CT scan. ABDOMEN/PELVIS Single intensely hypermetabolic periportal lymph node with a rounded morphology measuring 19 by 19 mm (image 153, series 3)  and intense metabolic activity (SUV max equal 14.6). No additional hypermetabolic lymph nodes in the abdomen or pelvis. No abnormal metabolic activity liver spleen. Adrenal glands and kidneys are normal. Gallstones within lumen of the gallbladder noted. No abnormal metabolic activity within the bowel. There is diverticulosis of the sigmoid colon. Abnormal metabolic activity associated the bladder. SKELETON No focal hypermetabolic activity to suggest skeletal metastasis. IMPRESSION: 1. Mild postsurgical metabolic inflammation at the surgical site adjacent the RIGHT parotid gland. No evidence of metastatic disease in the head or neck. 2. 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 3. No primary site identified other than the excised RIGHT parotid lesion. Electronically Signed   By: Suzy Bouchard M.D.   On: 08/06/2015 10:35    ASSESSMENT: Neuroendocrine tumor, consistent with small cell carcinoma.  PLAN:    1. Neuroendocrine tumor: Pathology and imaging results reviewed independently. PET scan results reviewed independently and reported as above. EUS biopsy of lymph node and porta hepatis revealed a low-grade neuroendocrine tumor slightly different than the high-grade/small cell carcinoma noted in his parotid gland. MRI the brain reviewed independently without metastatic disease. After lengthy discussion with the patient, he expressed understanding that he is technically stage IV disease and wishes to pursue chemotherapy. Proceed with cycle 1, day 1 of carboplatinum and etoposide. Return to clinic in 1 and 2 days for etoposide only. Will hold Neulasta since patient will also be receiving XRT to his parotid gland. Return to clinic in 1 week for laboratory work and evaluation and then in 3 weeks for consideration of cycle 2. Plan to give 3 cycles. Will add in Neulasta support after XRT is completed.    Approximately 30 minutes was spent in  discussion of which greater than 50% was consultation.  Patient expressed understanding and was in agreement with this plan. He also understands that He can call clinic at any time with any questions, concerns, or complaints.    Lloyd Huger, MD   09/02/2015 9:06 AM

## 2015-09-03 ENCOUNTER — Inpatient Hospital Stay: Payer: Medicare Other

## 2015-09-03 ENCOUNTER — Inpatient Hospital Stay (HOSPITAL_BASED_OUTPATIENT_CLINIC_OR_DEPARTMENT_OTHER): Payer: Medicare Other | Admitting: Oncology

## 2015-09-03 DIAGNOSIS — Z85828 Personal history of other malignant neoplasm of skin: Secondary | ICD-10-CM

## 2015-09-03 DIAGNOSIS — F329 Major depressive disorder, single episode, unspecified: Secondary | ICD-10-CM | POA: Diagnosis not present

## 2015-09-03 DIAGNOSIS — Z8546 Personal history of malignant neoplasm of prostate: Secondary | ICD-10-CM

## 2015-09-03 DIAGNOSIS — I10 Essential (primary) hypertension: Secondary | ICD-10-CM

## 2015-09-03 DIAGNOSIS — Z79899 Other long term (current) drug therapy: Secondary | ICD-10-CM

## 2015-09-03 DIAGNOSIS — G4730 Sleep apnea, unspecified: Secondary | ICD-10-CM

## 2015-09-03 DIAGNOSIS — C7A1 Malignant poorly differentiated neuroendocrine tumors: Secondary | ICD-10-CM

## 2015-09-03 DIAGNOSIS — K5730 Diverticulosis of large intestine without perforation or abscess without bleeding: Secondary | ICD-10-CM

## 2015-09-03 DIAGNOSIS — Z5111 Encounter for antineoplastic chemotherapy: Secondary | ICD-10-CM | POA: Diagnosis not present

## 2015-09-03 DIAGNOSIS — N3289 Other specified disorders of bladder: Secondary | ICD-10-CM

## 2015-09-03 DIAGNOSIS — K219 Gastro-esophageal reflux disease without esophagitis: Secondary | ICD-10-CM

## 2015-09-03 DIAGNOSIS — R591 Generalized enlarged lymph nodes: Secondary | ICD-10-CM

## 2015-09-03 LAB — COMPREHENSIVE METABOLIC PANEL
ALBUMIN: 4 g/dL (ref 3.5–5.0)
ALK PHOS: 45 U/L (ref 38–126)
ALT: 24 U/L (ref 17–63)
AST: 15 U/L (ref 15–41)
Anion gap: 5 (ref 5–15)
BILIRUBIN TOTAL: 0.7 mg/dL (ref 0.3–1.2)
BUN: 17 mg/dL (ref 6–20)
CALCIUM: 8.6 mg/dL — AB (ref 8.9–10.3)
CO2: 29 mmol/L (ref 22–32)
CREATININE: 1 mg/dL (ref 0.61–1.24)
Chloride: 97 mmol/L — ABNORMAL LOW (ref 101–111)
GFR calc Af Amer: >60 mL/min (ref >60)
Glucose, Bld: 106 mg/dL — ABNORMAL HIGH (ref 65–99)
POTASSIUM: 3.9 mmol/L (ref 3.5–5.1)
SODIUM: 131 mmol/L — AB (ref 135–145)
TOTAL PROTEIN: 6.9 g/dL (ref 6.5–8.1)

## 2015-09-03 LAB — CBC WITH DIFFERENTIAL/PLATELET
BASOS PCT: 0 %
Basophils Absolute: 0 10*3/uL (ref 0–0.1)
EOS ABS: 0.1 10*3/uL (ref 0–0.7)
EOS PCT: 3 %
HCT: 37.6 % — ABNORMAL LOW (ref 40.0–52.0)
HEMOGLOBIN: 13.4 g/dL (ref 13.0–18.0)
LYMPHS ABS: 1.1 10*3/uL (ref 1.0–3.6)
Lymphocytes Relative: 27 %
MCH: 31.3 pg (ref 26.0–34.0)
MCHC: 35.7 g/dL (ref 32.0–36.0)
MCV: 87.7 fL (ref 80.0–100.0)
MONOS PCT: 2 %
Monocytes Absolute: 0.1 10*3/uL — ABNORMAL LOW (ref 0.2–1.0)
NEUTROS PCT: 68 %
Neutro Abs: 2.9 10*3/uL (ref 1.4–6.5)
PLATELETS: 161 10*3/uL (ref 150–440)
RBC: 4.29 MIL/uL — AB (ref 4.40–5.90)
RDW: 12.8 % (ref 11.5–14.5)
WBC: 4.2 10*3/uL (ref 3.8–10.6)

## 2015-09-03 NOTE — Progress Notes (Signed)
[place]  Telephone:(336) [phone] Fax:(336) [phone]  ID: FRANCISCA HARBUCK OB: [birth year]  MR#: [id number]  YQI#:[id number]  Patient Care Team: Kirk Ruths, MD as PCP - General (Internal Medicine)  CHIEF COMPLAINT:  Chief Complaint  Patient presents with  . neuroendocrine tumor    INTERVAL HISTORY: Patient returns to clinic today for further evaluation and to assess his toleration of cycle 1 of  carboplatinum and etoposide. Patient tolerated his treatment well with only some mild constipation that has since resolved. He currently feels well and is asymptomatic.  He has no neurologic complaints. He denies any recent fevers or illnesses. He has a good appetite and denies weight loss. He denies any pain. He denies any chest pain or shortness of breath. He denies any nausea, vomiting, or diarrhea. He has no urinary complaints. Patient offers no specific complaints today.  REVIEW OF SYSTEMS:   Review of Systems  Constitutional: Negative.  Negative for fever, weight loss and malaise/fatigue.  HENT: Negative.  Negative for sore throat.   Respiratory: Negative.  Negative for hemoptysis and shortness of breath.   Cardiovascular: Negative.  Negative for chest pain.  Gastrointestinal: Positive for constipation. Negative for nausea, vomiting, abdominal pain, diarrhea, blood in stool and melena.  Genitourinary: Negative.   Musculoskeletal: Negative.   Neurological: Negative.  Negative for weakness and headaches.  Psychiatric/Behavioral: Negative.     As per HPI. Otherwise, a complete review of systems is negatve.  PAST MEDICAL HISTORY: Past Medical History  Diagnosis Date  . Depression     Since 04/12  . Hypertension   . Sleep apnea     USES CPAP  . GERD (gastroesophageal reflux disease)     H/O  . Cancer ([place])     PROSTATE,BASAL CELL-FOREHEAD, RIGHT EYE-SQUAMOUS CELL RIGHT EAR    PAST SURGICAL HISTORY: Past Surgical History  Procedure Laterality Date  .  Prostatectomy    . Colonoscopy    . Eye surgery Bilateral     CATARACTS  . Penile prosthesis implant    . Toe surgery    . Parotidectomy Right 07/21/2015    Procedure: PAROTIDECTOMY;  Surgeon: Margaretha Sheffield, MD;  Location: [facility] ORS;  Service: ENT;  Laterality: Right;    FAMILY HISTORY: Reviewed and unchanged. No reported history of malignancy or chronic disease.     ADVANCED DIRECTIVES:    HEALTH MAINTENANCE: Social History  Substance Use Topics  . Smoking status: Never Smoker   . Smokeless tobacco: Not on file  . Alcohol Use: No     Colonoscopy:  PAP:  Bone density:  Lipid panel:  Allergies  Allergen Reactions  . Codeine Other (See Comments)    Hallucinations.  Freida Busman [Terbinafine Hcl] Rash  . Penicillins Rash    Current Outpatient Prescriptions  Medication Sig Dispense Refill  . amLODipine (NORVASC) 5 MG tablet Take 5 mg by mouth every morning.     . desvenlafaxine (PRISTIQ) 50 MG 24 hr tablet Take 50 mg by mouth every morning.     . prochlorperazine (COMPAZINE) 10 MG tablet Take 1 tablet (10 mg total) by mouth every 6 (six) hours as needed (Nausea or vomiting). 30 tablet 1  . testosterone (ANDRODERM) 4 MG/24HR PT24 patch Place 1 patch onto the skin daily.    . traZODone (DESYREL) 150 MG tablet Take by mouth at bedtime.     No current facility-administered medications for this visit.    OBJECTIVE: Filed Vitals:   09/03/15 1127  BP: 117/71  Pulse:  54  Temp: 97.3 F (36.3 C)  Resp: 18     Body mass index is 32.86 kg/(m^2).    ECOG FS:0 - Asymptomatic  General: Well-developed, well-nourished, no acute distress. Eyes: Pink conjunctiva, anicteric sclera. HEENT: Normocephalic, moist mucous membranes, clear oropharnyx. Well-healed surgical scar on right neck. Lungs: Clear to auscultation bilaterally. Heart: Regular rate and rhythm. No rubs, murmurs, or gallops. Abdomen: Soft, nontender, nondistended. No organomegaly noted, normoactive bowel  sounds. Musculoskeletal: No edema, cyanosis, or clubbing. Neuro: Alert, answering all questions appropriately. Cranial nerves grossly intact. Skin: No rashes or petechiae noted. Psych: Normal affect.   LAB RESULTS:  Lab Results  Component Value Date   NA 131* 09/03/2015   K 3.9 09/03/2015   CL 97* 09/03/2015   CO2 29 09/03/2015   GLUCOSE 106* 09/03/2015   BUN 17 09/03/2015   CREATININE 1.00 09/03/2015   CALCIUM 8.6* 09/03/2015   PROT 6.9 09/03/2015   ALBUMIN 4.0 09/03/2015   AST 15 09/03/2015   ALT 24 09/03/2015   ALKPHOS 45 09/03/2015   BILITOT 0.7 09/03/2015   GFRNONAA >60 09/03/2015   GFRAA >60 09/03/2015    Lab Results  Component Value Date   WBC 4.2 09/03/2015   NEUTROABS 2.9 09/03/2015   HGB 13.4 09/03/2015   HCT 37.6* 09/03/2015   MCV 87.7 09/03/2015   PLT 161 09/03/2015     STUDIES: Mr Jeri Cos IO Contrast  08/24/2015  CLINICAL DATA:  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 Question brain metastases. EXAM: MRI HEAD WITHOUT AND WITH CONTRAST TECHNIQUE: Multiplanar, multiecho pulse sequences of the brain and surrounding structures were obtained without and with intravenous contrast. CONTRAST:  57m MULTIHANCE GADOBENATE DIMEGLUMINE 529 MG/ML IV SOLN COMPARISON:  None. FINDINGS: Postoperative changes of the right parotid gland are noted. The study is not tailored to evaluate for local tumor at the parotid gland as the parotid is incompletely imaged. The visualized residual parotid tissue is within normal limits. There is postsurgical changes extending into the region of the deep lobe which may represent scar tissue. There is no abnormal signal within the mandible. No acute infarct, hemorrhage, or mass lesion is present. No pathologic enhancement is present. No significant white matter disease is present. Internal auditory canals are within normal limits. The brainstem and cerebellum are normal. Flow is  present in the major intracranial arteries. The globes and orbits are intact. The paranasal sinuses are clear. Fluid is present in the right mastoid air cells. No obstructing nasopharyngeal lesion is present. IMPRESSION: 1. Postoperative changes at the level of the right parotid gland. There is some tissue within the deep space. This likely represents scar tissue. Please see the discussion above. 2. Right mastoid effusion likely related to local treatment affects. There is no definite enhancement or evidence for perineural spread of tumor. 3. Otherwise normal MRI appearance the brain without evidence for intracranial metastases. Electronically Signed   By: CSan MorelleM.D.   On: 0June 11, 201712:05   Nm Pet Image Initial (pi) Skull Base To Thigh  08/06/2015  CLINICAL DATA:  Initial treatment strategy for high-grade neuroendocrine carcinoma. RIGHT neck tumor surgically removed. EXAM: NUCLEAR MEDICINE PET SKULL BASE TO THIGH TECHNIQUE: 11.9 mCi F-18 FDG was injected intravenously. Full-ring PET imaging was performed from the skull base to thigh after the radiotracer. CT data was obtained and used for attenuation correction and anatomic localization. FASTING BLOOD GLUCOSE:  Value: 110 mg/dl COMPARISON:  CT 06/30/2015 FINDINGS:  NECK There is minimal metabolic activity at the surgical resection site posterior to the RIGHT parotid gland. This activity is felt to be within normal limits for postsurgical inflammation. There are no hypermetabolic lymph nodes in the neck. Physiologic symmetric pharyngeal mucosal activity noted. CHEST No hypermetabolic mediastinal or hilar nodes. No suspicious pulmonary nodules on the CT scan. ABDOMEN/PELVIS Single intensely hypermetabolic periportal lymph node with a rounded morphology measuring 19 by 19 mm (image 153, series 3) and intense metabolic activity (SUV max equal 14.6). No additional hypermetabolic lymph nodes in the abdomen or pelvis. No abnormal metabolic activity liver  spleen. Adrenal glands and kidneys are normal. Gallstones within lumen of the gallbladder noted. No abnormal metabolic activity within the bowel. There is diverticulosis of the sigmoid colon. Abnormal metabolic activity associated the bladder. SKELETON No focal hypermetabolic activity to suggest skeletal metastasis. IMPRESSION: 1. Mild postsurgical metabolic inflammation at the surgical site adjacent the RIGHT parotid gland. No evidence of metastatic disease in the head or neck. 2. 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 3. No primary site identified other than the excised RIGHT parotid lesion. Electronically Signed   By: Suzy Bouchard M.D.   On: 08/06/2015 10:35    ASSESSMENT: Neuroendocrine tumor, consistent with small cell carcinoma.  PLAN:    1. Neuroendocrine tumor: Pathology and imaging results reviewed independently. PET scan results reviewed independently and reported as above. EUS biopsy of lymph node and porta hepatis revealed a low-grade neuroendocrine tumor slightly different than the high-grade/small cell carcinoma noted in his parotid gland. MRI the brain reviewed independently without metastatic disease. After lengthy discussion with the patient, he expressed understanding that he is technically stage IV disease and wishes to pursue chemotherapy. Patient tolerated his first infusion of carboplatinum and etoposide well without significant side effects. Return to clinic in 2 weeks for consideration of cycle 2, day 1 of carboplatinum and etoposide. Will hold Neulasta since patient will also be receiving XRT to his parotid gland. Plan to give 3 cycles. Will add in Neulasta support after XRT is completed.   2. Constipation: Resolved. Continue current treatment as needed.  Patient expressed understanding and was in agreement with this plan. He also understands that He can call clinic at any time with any questions, concerns, or  complaints.    Lloyd Huger, MD   09/03/2015 12:18 PM

## 2015-09-03 NOTE — Progress Notes (Signed)
States is feeling well today. Had side effects of treatment over the weekend. Side effects included nausea in the morning, headaches, constipation. Symptoms have resolved today.

## 2015-09-04 DIAGNOSIS — Z510 Encounter for antineoplastic radiation therapy: Secondary | ICD-10-CM | POA: Diagnosis not present

## 2015-09-09 DIAGNOSIS — Z510 Encounter for antineoplastic radiation therapy: Secondary | ICD-10-CM | POA: Diagnosis not present

## 2015-09-10 ENCOUNTER — Ambulatory Visit
Admission: RE | Admit: 2015-09-10 | Discharge: 2015-09-10 | Disposition: A | Discharge status: Home / Self Care | Payer: Medicare Other | Source: Ambulatory Visit | Attending: Radiation Oncology | Admitting: Radiation Oncology

## 2015-09-11 ENCOUNTER — Ambulatory Visit
Admission: RE | Admit: 2015-09-11 | Discharge: 2015-09-11 | Disposition: A | Discharge status: Home / Self Care | Payer: Medicare Other | Source: Ambulatory Visit | Attending: Radiation Oncology | Admitting: Radiation Oncology

## 2015-09-11 DIAGNOSIS — Z510 Encounter for antineoplastic radiation therapy: Secondary | ICD-10-CM | POA: Diagnosis not present

## 2015-09-12 ENCOUNTER — Ambulatory Visit
Admission: RE | Admit: 2015-09-12 | Discharge: 2015-09-12 | Disposition: A | Discharge status: Home / Self Care | Payer: Medicare Other | Source: Ambulatory Visit | Attending: Radiation Oncology | Admitting: Radiation Oncology

## 2015-09-12 DIAGNOSIS — Z510 Encounter for antineoplastic radiation therapy: Secondary | ICD-10-CM | POA: Diagnosis not present

## 2015-09-15 ENCOUNTER — Ambulatory Visit
Admission: RE | Admit: 2015-09-15 | Discharge: 2015-09-15 | Disposition: A | Discharge status: Home / Self Care | Payer: Medicare Other | Source: Ambulatory Visit | Attending: Radiation Oncology | Admitting: Radiation Oncology

## 2015-09-15 DIAGNOSIS — Z510 Encounter for antineoplastic radiation therapy: Secondary | ICD-10-CM | POA: Diagnosis not present

## 2015-09-17 ENCOUNTER — Inpatient Hospital Stay (HOSPITAL_BASED_OUTPATIENT_CLINIC_OR_DEPARTMENT_OTHER): Payer: Medicare Other | Admitting: Oncology

## 2015-09-17 ENCOUNTER — Ambulatory Visit
Admission: RE | Admit: 2015-09-17 | Discharge: 2015-09-17 | Disposition: A | Discharge status: Home / Self Care | Payer: Medicare Other | Source: Ambulatory Visit | Attending: Radiation Oncology | Admitting: Radiation Oncology

## 2015-09-17 ENCOUNTER — Inpatient Hospital Stay: Payer: Medicare Other | Attending: Oncology

## 2015-09-17 ENCOUNTER — Inpatient Hospital Stay: Payer: Medicare Other

## 2015-09-17 ENCOUNTER — Other Ambulatory Visit: Payer: Self-pay

## 2015-09-17 VITALS — BP 134/70 | HR 59 | Temp 95.9°F | Resp 18 | Wt 233.2 lb

## 2015-09-17 DIAGNOSIS — G4730 Sleep apnea, unspecified: Secondary | ICD-10-CM | POA: Diagnosis not present

## 2015-09-17 DIAGNOSIS — F329 Major depressive disorder, single episode, unspecified: Secondary | ICD-10-CM | POA: Diagnosis not present

## 2015-09-17 DIAGNOSIS — K219 Gastro-esophageal reflux disease without esophagitis: Secondary | ICD-10-CM | POA: Diagnosis not present

## 2015-09-17 DIAGNOSIS — T451X5S Adverse effect of antineoplastic and immunosuppressive drugs, sequela: Secondary | ICD-10-CM | POA: Diagnosis not present

## 2015-09-17 DIAGNOSIS — Z79899 Other long term (current) drug therapy: Secondary | ICD-10-CM | POA: Diagnosis not present

## 2015-09-17 DIAGNOSIS — I10 Essential (primary) hypertension: Secondary | ICD-10-CM | POA: Diagnosis not present

## 2015-09-17 DIAGNOSIS — C7A1 Malignant poorly differentiated neuroendocrine tumors: Secondary | ICD-10-CM | POA: Diagnosis not present

## 2015-09-17 DIAGNOSIS — D709 Neutropenia, unspecified: Secondary | ICD-10-CM

## 2015-09-17 DIAGNOSIS — K5900 Constipation, unspecified: Secondary | ICD-10-CM | POA: Insufficient documentation

## 2015-09-17 DIAGNOSIS — Z5111 Encounter for antineoplastic chemotherapy: Secondary | ICD-10-CM | POA: Insufficient documentation

## 2015-09-17 DIAGNOSIS — Z510 Encounter for antineoplastic radiation therapy: Secondary | ICD-10-CM | POA: Diagnosis not present

## 2015-09-17 LAB — CBC WITH DIFFERENTIAL/PLATELET
BASOS PCT: 1 %
Basophils Absolute: 0 10*3/uL (ref 0–0.1)
Eosinophils Absolute: 0 10*3/uL (ref 0–0.7)
Eosinophils Relative: 1 %
HEMATOCRIT: 36.2 % — AB (ref 40.0–52.0)
HEMOGLOBIN: 13.1 g/dL (ref 13.0–18.0)
LYMPHS ABS: 0.7 10*3/uL — AB (ref 1.0–3.6)
LYMPHS PCT: 32 %
MCH: 31.3 pg (ref 26.0–34.0)
MCHC: 36.1 g/dL — AB (ref 32.0–36.0)
MCV: 86.7 fL (ref 80.0–100.0)
MONO ABS: 0.4 10*3/uL (ref 0.2–1.0)
MONOS PCT: 20 %
NEUTROS ABS: 1.1 10*3/uL — AB (ref 1.4–6.5)
NEUTROS PCT: 46 %
Platelets: 194 10*3/uL (ref 150–440)
RBC: 4.17 MIL/uL — ABNORMAL LOW (ref 4.40–5.90)
RDW: 13 % (ref 11.5–14.5)
WBC: 2.3 10*3/uL — ABNORMAL LOW (ref 3.8–10.6)

## 2015-09-17 LAB — COMPREHENSIVE METABOLIC PANEL
ALK PHOS: 45 U/L (ref 38–126)
ALT: 23 U/L (ref 17–63)
ANION GAP: 6 (ref 5–15)
AST: 22 U/L (ref 15–41)
Albumin: 4.1 g/dL (ref 3.5–5.0)
BILIRUBIN TOTAL: 0.5 mg/dL (ref 0.3–1.2)
BUN: 7 mg/dL (ref 6–20)
CO2: 28 mmol/L (ref 22–32)
CREATININE: 0.94 mg/dL (ref 0.61–1.24)
Calcium: 8.9 mg/dL (ref 8.9–10.3)
Chloride: 94 mmol/L — ABNORMAL LOW (ref 101–111)
GLUCOSE: 116 mg/dL — AB (ref 65–99)
POTASSIUM: 3.9 mmol/L (ref 3.5–5.1)
Sodium: 128 mmol/L — ABNORMAL LOW (ref 135–145)
TOTAL PROTEIN: 7 g/dL (ref 6.5–8.1)

## 2015-09-17 MED ORDER — SODIUM CHLORIDE 0.9 % IV SOLN
Freq: Once | INTRAVENOUS | Status: AC
  Administered 2015-09-17: 11:00:00 via INTRAVENOUS
  Filled 2015-09-17: qty 1000

## 2015-09-17 MED ORDER — SODIUM CHLORIDE 0.9 % IV SOLN
10.0000 mg | Freq: Once | INTRAVENOUS | Status: AC
  Administered 2015-09-17: 10 mg via INTRAVENOUS
  Filled 2015-09-17: qty 1

## 2015-09-17 MED ORDER — CARBOPLATIN CHEMO INJECTION 600 MG/60ML
641.5000 mg | Freq: Once | INTRAVENOUS | Status: AC
  Administered 2015-09-17: 640 mg via INTRAVENOUS
  Filled 2015-09-17: qty 64

## 2015-09-17 MED ORDER — SODIUM CHLORIDE 0.9 % IV SOLN
100.0000 mg/m2 | Freq: Once | INTRAVENOUS | Status: AC
  Administered 2015-09-17: 230 mg via INTRAVENOUS
  Filled 2015-09-17: qty 11.5

## 2015-09-17 MED ORDER — PALONOSETRON HCL INJECTION 0.25 MG/5ML
0.2500 mg | Freq: Once | INTRAVENOUS | Status: AC
  Administered 2015-09-17: 0.25 mg via INTRAVENOUS
  Filled 2015-09-17: qty 5

## 2015-09-17 NOTE — Progress Notes (Signed)
ANC today 1.1. Dr Grayland Ormond aware and okay to proceed with treatment

## 2015-09-17 NOTE — Progress Notes (Signed)
Offers no complaints  

## 2015-09-17 NOTE — Progress Notes (Signed)
[place]  [phone]) [phone] [phone]  ID: DAYNA GEURTS OB: 04-14-1945  MR#: [id number]  BZJ#:[id number]  Patient Care Team: Kirk Ruths, MD as PCP - General (Internal Medicine)  CHIEF COMPLAINT: Stage IV neuroendocrine tumor, consistent with small cell carcinoma with parotid metastasis.  Chief Complaint  Patient presents with  . neuroendocrine tumor    INTERVAL HISTORY: Patient returns to clinic today for further evaluation and consideration of cycle 2 of carboplatinum and etoposide. Patient continues to feel well and is asymptomatic.  He has no neurologic complaints. He denies any recent fevers or illnesses. He has a good appetite and denies weight loss. He denies any pain. He denies any chest pain or shortness of breath. He denies any nausea, vomiting, or diarrhea. He has no urinary complaints. Patient offers no specific complaints today.  REVIEW OF SYSTEMS:   Review of Systems  Constitutional: Negative.  Negative for fever, weight loss and malaise/fatigue.  HENT: Negative.  Negative for sore throat.   Respiratory: Negative.  Negative for hemoptysis and shortness of breath.   Cardiovascular: Negative.  Negative for chest pain.  Gastrointestinal: Positive for constipation. Negative for nausea, vomiting, abdominal pain, diarrhea, blood in stool and melena.  Genitourinary: Negative.   Musculoskeletal: Negative.   Neurological: Negative.  Negative for weakness and headaches.  Psychiatric/Behavioral: Negative.     As per HPI. Otherwise, a complete review of systems is negatve.  PAST MEDICAL HISTORY: Past Medical History  Diagnosis Date  . Depression     Since 04/12  . Hypertension   . Sleep apnea     USES CPAP  . GERD (gastroesophageal reflux disease)     H/O  . Cancer ([place])     PROSTATE,BASAL CELL-FOREHEAD, RIGHT EYE-SQUAMOUS CELL RIGHT EAR    PAST SURGICAL HISTORY: Past Surgical History  Procedure Laterality Date  .  Prostatectomy    . Colonoscopy    . Eye surgery Bilateral     CATARACTS  . Penile prosthesis implant    . Toe surgery    . Parotidectomy Right 07/21/2015    Procedure: PAROTIDECTOMY;  Surgeon: Margaretha Sheffield, MD;  Location: [facility] ORS;  Service: ENT;  Laterality: Right;    FAMILY HISTORY: Reviewed and unchanged. No reported history of malignancy or chronic disease.     ADVANCED DIRECTIVES:    HEALTH MAINTENANCE: Social History  Substance Use Topics  . Smoking status: Never Smoker   . Smokeless tobacco: Not on file  . Alcohol Use: No     Colonoscopy:  PAP:  Bone density:  Lipid panel:  Allergies  Allergen Reactions  . Codeine Other (See Comments)    Hallucinations.  Freida Busman [Terbinafine Hcl] Rash  . Penicillins Rash    Current Outpatient Prescriptions  Medication Sig Dispense Refill  . amLODipine (NORVASC) 5 MG tablet Take 5 mg by mouth every morning.     . desvenlafaxine (PRISTIQ) 50 MG 24 hr tablet Take 50 mg by mouth every morning.     . prochlorperazine (COMPAZINE) 10 MG tablet Take 1 tablet (10 mg total) by mouth every 6 (six) hours as needed (Nausea or vomiting). 30 tablet 1  . testosterone (ANDRODERM) 4 MG/24HR PT24 patch Place 1 patch onto the skin daily.    . traZODone (DESYREL) 150 MG tablet Take by mouth at bedtime.     No current facility-administered medications for this visit.   Facility-Administered Medications Ordered in Other Visits  Medication Dose Route Frequency Provider Last Rate Last Dose  .  0.9 %  sodium chloride infusion   Intravenous Once Lloyd Huger, MD      . CARBOplatin (PARAPLATIN) 640 mg in sodium chloride 0.9 % 250 mL chemo infusion  640 mg Intravenous Once Lloyd Huger, MD      . dexamethasone (DECADRON) 10 mg in sodium chloride 0.9 % 50 mL IVPB  10 mg Intravenous Once Lloyd Huger, MD      . etoposide (VEPESID) 230 mg in sodium chloride 0.9 % 1,000 mL chemo infusion  100 mg/m2 (Treatment Plan Actual) Intravenous Once  Lloyd Huger, MD      . palonosetron (ALOXI) injection 0.25 mg  0.25 mg Intravenous Once Lloyd Huger, MD        OBJECTIVE: Filed Vitals:   09/17/15 0904  BP: 134/70  Pulse: 59  Temp: 95.9 F (35.5 C)  Resp: 18     Body mass index is 33.39 kg/(m^2).    ECOG FS:0 - Asymptomatic  General: Well-developed, well-nourished, no acute distress. Eyes: Pink conjunctiva, anicteric sclera. HEENT: Normocephalic, moist mucous membranes, clear oropharnyx. Well-healed surgical scar on right neck. Lungs: Clear to auscultation bilaterally. Heart: Regular rate and rhythm. No rubs, murmurs, or gallops. Abdomen: Soft, nontender, nondistended. No organomegaly noted, normoactive bowel sounds. Musculoskeletal: No edema, cyanosis, or clubbing. Neuro: Alert, answering all questions appropriately. Cranial nerves grossly intact. Skin: No rashes or petechiae noted. Psych: Normal affect.   LAB RESULTS:  Lab Results  Component Value Date   NA 128* 09/17/2015   K 3.9 09/17/2015   CL 94* 09/17/2015   CO2 28 09/17/2015   GLUCOSE 116* 09/17/2015   BUN 7 09/17/2015   CREATININE 0.94 09/17/2015   CALCIUM 8.9 09/17/2015   PROT 7.0 09/17/2015   ALBUMIN 4.1 09/17/2015   AST 22 09/17/2015   ALT 23 09/17/2015   ALKPHOS 45 09/17/2015   BILITOT 0.5 09/17/2015   GFRNONAA >60 09/17/2015   GFRAA >60 09/17/2015    Lab Results  Component Value Date   WBC 2.3* 09/17/2015   NEUTROABS 1.1* 09/17/2015   HGB 13.1 09/17/2015   HCT 36.2* 09/17/2015   MCV 86.7 09/17/2015   PLT 194 09/17/2015     STUDIES: Mr Jeri Cos QP Contrast  09/16/2015  CLINICAL DATA:  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 Question brain metastases. EXAM: MRI HEAD WITHOUT AND WITH CONTRAST TECHNIQUE: Multiplanar, multiecho pulse sequences of the brain and surrounding structures were obtained without and with intravenous contrast. CONTRAST:  62m MULTIHANCE  GADOBENATE DIMEGLUMINE 529 MG/ML IV SOLN COMPARISON:  None. FINDINGS: Postoperative changes of the right parotid gland are noted. The study is not tailored to evaluate for local tumor at the parotid gland as the parotid is incompletely imaged. The visualized residual parotid tissue is within normal limits. There is postsurgical changes extending into the region of the deep lobe which may represent scar tissue. There is no abnormal signal within the mandible. No acute infarct, hemorrhage, or mass lesion is present. No pathologic enhancement is present. No significant white matter disease is present. Internal auditory canals are within normal limits. The brainstem and cerebellum are normal. Flow is present in the major intracranial arteries. The globes and orbits are intact. The paranasal sinuses are clear. Fluid is present in the right mastoid air cells. No obstructing nasopharyngeal lesion is present. IMPRESSION: 1. Postoperative changes at the level of the right parotid gland. There is some tissue within the deep space. This likely  represents scar tissue. Please see the discussion above. 2. Right mastoid effusion likely related to local treatment affects. There is no definite enhancement or evidence for perineural spread of tumor. 3. Otherwise normal MRI appearance the brain without evidence for intracranial metastases. Electronically Signed   By: San Morelle M.D.   On: 08/22/2015 12:05    ASSESSMENT: Stage IV neuroendocrine tumor, consistent with small cell carcinoma with parotid metastasis.  PLAN:    1. Stage IV neuroendocrine tumor, consistent with small cell carcinoma with parotid metastasis: Pathology and imaging results reviewed independently. PET scan results reviewed independently and reported as above. EUS biopsy of lymph node and porta hepatis revealed a low-grade neuroendocrine tumor slightly different than the high-grade/small cell carcinoma noted in his parotid gland. This is likely his  primary and the parotid lesion is metastasis. MRI the brain reviewed independently without metastatic disease. Proceed with cycle 2 of carboplatinum and etoposide today. Return to clinic in 1 and 2 days for etoposide only and then in 3 weeks for consideration of cycle 3, day 1 of carboplatinum and etoposide. Will hold Neulasta since patient will also be receiving XRT to his parotid gland. Plan to give 3 cycles then reimage. Will add in Neulasta support after XRT is completed.   2. Constipation: Resolved. Continue current treatment as needed. 3. Neutropenia: Secondary to chemotherapy. Neulasta on hold secondary to daily XRT. Monitor.  Patient expressed understanding and was in agreement with this plan. He also understands that He can call clinic at any time with any questions, concerns, or complaints.    Lloyd Huger, MD   09/17/2015 11:20 AM

## 2015-09-18 ENCOUNTER — Ambulatory Visit
Admission: RE | Admit: 2015-09-18 | Discharge: 2015-09-18 | Disposition: A | Discharge status: Home / Self Care | Payer: Medicare Other | Source: Ambulatory Visit | Attending: Radiation Oncology | Admitting: Radiation Oncology

## 2015-09-18 ENCOUNTER — Inpatient Hospital Stay: Payer: Medicare Other

## 2015-09-18 VITALS — BP 123/72 | HR 62 | Temp 96.7°F | Resp 18

## 2015-09-18 DIAGNOSIS — C7A1 Malignant poorly differentiated neuroendocrine tumors: Secondary | ICD-10-CM

## 2015-09-18 DIAGNOSIS — Z5111 Encounter for antineoplastic chemotherapy: Secondary | ICD-10-CM | POA: Diagnosis not present

## 2015-09-18 DIAGNOSIS — Z510 Encounter for antineoplastic radiation therapy: Secondary | ICD-10-CM | POA: Diagnosis not present

## 2015-09-18 MED ORDER — SODIUM CHLORIDE 0.9 % IV SOLN
Freq: Once | INTRAVENOUS | Status: AC
  Administered 2015-09-18: 14:00:00 via INTRAVENOUS
  Filled 2015-09-18: qty 1000

## 2015-09-18 MED ORDER — SODIUM CHLORIDE 0.9 % IV SOLN
10.0000 mg | Freq: Once | INTRAVENOUS | Status: AC
  Administered 2015-09-18: 10 mg via INTRAVENOUS
  Filled 2015-09-18: qty 1

## 2015-09-18 MED ORDER — SODIUM CHLORIDE 0.9 % IV SOLN
100.0000 mg/m2 | Freq: Once | INTRAVENOUS | Status: AC
  Administered 2015-09-18: 230 mg via INTRAVENOUS
  Filled 2015-09-18: qty 11.5

## 2015-09-19 ENCOUNTER — Ambulatory Visit
Admission: RE | Admit: 2015-09-19 | Discharge: 2015-09-19 | Disposition: A | Discharge status: Home / Self Care | Payer: Medicare Other | Source: Ambulatory Visit | Attending: Radiation Oncology | Admitting: Radiation Oncology

## 2015-09-19 ENCOUNTER — Inpatient Hospital Stay: Payer: Medicare Other

## 2015-09-19 VITALS — BP 123/67 | HR 56 | Temp 96.6°F | Resp 18

## 2015-09-19 DIAGNOSIS — Z510 Encounter for antineoplastic radiation therapy: Secondary | ICD-10-CM | POA: Diagnosis not present

## 2015-09-19 DIAGNOSIS — Z5111 Encounter for antineoplastic chemotherapy: Secondary | ICD-10-CM | POA: Diagnosis not present

## 2015-09-19 DIAGNOSIS — C7A1 Malignant poorly differentiated neuroendocrine tumors: Secondary | ICD-10-CM

## 2015-09-19 MED ORDER — SODIUM CHLORIDE 0.9 % IV SOLN
100.0000 mg/m2 | Freq: Once | INTRAVENOUS | Status: AC
  Administered 2015-09-19: 230 mg via INTRAVENOUS
  Filled 2015-09-19: qty 11.5

## 2015-09-19 MED ORDER — SODIUM CHLORIDE 0.9 % IV SOLN
Freq: Once | INTRAVENOUS | Status: AC
  Administered 2015-09-19: 15:00:00 via INTRAVENOUS
  Filled 2015-09-19: qty 1000

## 2015-09-19 MED ORDER — SODIUM CHLORIDE 0.9 % IV SOLN
10.0000 mg | Freq: Once | INTRAVENOUS | Status: AC
  Administered 2015-09-19: 10 mg via INTRAVENOUS
  Filled 2015-09-19: qty 1

## 2015-09-19 MED ORDER — SODIUM CHLORIDE 0.9% FLUSH
3.0000 mL | INTRAVENOUS | Status: DC | PRN
  Filled 2015-09-19: qty 3

## 2015-09-22 ENCOUNTER — Ambulatory Visit
Admission: RE | Admit: 2015-09-22 | Discharge: 2015-09-22 | Disposition: A | Discharge status: Home / Self Care | Payer: Medicare Other | Source: Ambulatory Visit | Attending: Radiation Oncology | Admitting: Radiation Oncology

## 2015-09-22 DIAGNOSIS — Z510 Encounter for antineoplastic radiation therapy: Secondary | ICD-10-CM | POA: Diagnosis not present

## 2015-09-23 ENCOUNTER — Ambulatory Visit
Admission: RE | Admit: 2015-09-23 | Discharge: 2015-09-23 | Disposition: A | Discharge status: Home / Self Care | Payer: Medicare Other | Source: Ambulatory Visit | Attending: Radiation Oncology | Admitting: Radiation Oncology

## 2015-09-23 DIAGNOSIS — Z510 Encounter for antineoplastic radiation therapy: Secondary | ICD-10-CM | POA: Diagnosis not present

## 2015-09-24 ENCOUNTER — Ambulatory Visit
Admission: RE | Admit: 2015-09-24 | Discharge: 2015-09-24 | Disposition: A | Discharge status: Home / Self Care | Payer: Medicare Other | Source: Ambulatory Visit | Attending: Radiation Oncology | Admitting: Radiation Oncology

## 2015-09-24 ENCOUNTER — Other Ambulatory Visit: Payer: Self-pay | Admitting: *Deleted

## 2015-09-24 ENCOUNTER — Telehealth: Payer: Self-pay | Admitting: *Deleted

## 2015-09-24 DIAGNOSIS — Z510 Encounter for antineoplastic radiation therapy: Secondary | ICD-10-CM | POA: Diagnosis not present

## 2015-09-24 MED ORDER — FIRST-DUKES MOUTHWASH MT SUSP: 5.0000 mL | Freq: Four times a day (QID) | OROMUCOSAL | Status: DC | PRN

## 2015-09-24 NOTE — Telephone Encounter (Signed)
Patients wife left message regarding Dukes Mouthwash not being covered by insurance. I spoke with [facility] and they state patients insurance will cover Dukes Mouthwash only if compounded in pharmacy. Pharmacy given verbal to proceed with filling whichever formulation of Dukes Mouthwash is covered by patients insurance. I spoke with patients wife to let her know the prescription would be ready this afternoon, copay is $5. I also educated patient on importance of neutropenic precautions right now as he got chemo last week and currently has mouth sores. Patient to call the office Friday if there is no improvement in mouth sores/symptoms.

## 2015-09-25 ENCOUNTER — Ambulatory Visit
Admission: RE | Admit: 2015-09-25 | Discharge: 2015-09-25 | Disposition: A | Discharge status: Home / Self Care | Payer: Medicare Other | Source: Ambulatory Visit | Attending: Radiation Oncology | Admitting: Radiation Oncology

## 2015-09-25 DIAGNOSIS — Z510 Encounter for antineoplastic radiation therapy: Secondary | ICD-10-CM | POA: Diagnosis not present

## 2015-09-26 ENCOUNTER — Ambulatory Visit
Admission: RE | Admit: 2015-09-26 | Discharge: 2015-09-26 | Disposition: A | Discharge status: Home / Self Care | Payer: Medicare Other | Source: Ambulatory Visit | Attending: Radiation Oncology | Admitting: Radiation Oncology

## 2015-09-26 DIAGNOSIS — Z510 Encounter for antineoplastic radiation therapy: Secondary | ICD-10-CM | POA: Diagnosis not present

## 2015-09-29 ENCOUNTER — Other Ambulatory Visit: Payer: Self-pay | Admitting: *Deleted

## 2015-09-29 ENCOUNTER — Ambulatory Visit
Admission: RE | Admit: 2015-09-29 | Discharge: 2015-09-29 | Disposition: A | Discharge status: Home / Self Care | Payer: Medicare Other | Source: Ambulatory Visit | Attending: Radiation Oncology | Admitting: Radiation Oncology

## 2015-09-29 DIAGNOSIS — Z510 Encounter for antineoplastic radiation therapy: Secondary | ICD-10-CM | POA: Diagnosis not present

## 2015-09-30 ENCOUNTER — Other Ambulatory Visit: Payer: Self-pay | Admitting: *Deleted

## 2015-09-30 ENCOUNTER — Ambulatory Visit
Admission: RE | Admit: 2015-09-30 | Discharge: 2015-09-30 | Disposition: A | Discharge status: Home / Self Care | Payer: Medicare Other | Source: Ambulatory Visit | Attending: Radiation Oncology | Admitting: Radiation Oncology

## 2015-09-30 DIAGNOSIS — Z510 Encounter for antineoplastic radiation therapy: Secondary | ICD-10-CM | POA: Diagnosis not present

## 2015-09-30 MED ORDER — SUCRALFATE 1 G PO TABS: 1.0000 g | ORAL_TABLET | Freq: Three times a day (TID) | ORAL | Status: DC

## 2015-10-01 ENCOUNTER — Ambulatory Visit
Admission: RE | Admit: 2015-10-01 | Discharge: 2015-10-01 | Disposition: A | Discharge status: Home / Self Care | Payer: Medicare Other | Source: Ambulatory Visit | Attending: Radiation Oncology | Admitting: Radiation Oncology

## 2015-10-01 DIAGNOSIS — Z510 Encounter for antineoplastic radiation therapy: Secondary | ICD-10-CM | POA: Diagnosis not present

## 2015-10-02 ENCOUNTER — Ambulatory Visit
Admission: RE | Admit: 2015-10-02 | Discharge: 2015-10-02 | Disposition: A | Discharge status: Home / Self Care | Payer: Medicare Other | Source: Ambulatory Visit | Attending: Radiation Oncology | Admitting: Radiation Oncology

## 2015-10-02 DIAGNOSIS — Z510 Encounter for antineoplastic radiation therapy: Secondary | ICD-10-CM | POA: Diagnosis not present

## 2015-10-03 ENCOUNTER — Ambulatory Visit
Admission: RE | Admit: 2015-10-03 | Discharge: 2015-10-03 | Disposition: A | Discharge status: Home / Self Care | Payer: Medicare Other | Source: Ambulatory Visit | Attending: Radiation Oncology | Admitting: Radiation Oncology

## 2015-10-03 DIAGNOSIS — Z510 Encounter for antineoplastic radiation therapy: Secondary | ICD-10-CM | POA: Diagnosis not present

## 2015-10-06 ENCOUNTER — Ambulatory Visit
Admission: RE | Admit: 2015-10-06 | Discharge: 2015-10-06 | Disposition: A | Discharge status: Home / Self Care | Payer: Medicare Other | Source: Ambulatory Visit | Attending: Radiation Oncology | Admitting: Radiation Oncology

## 2015-10-06 DIAGNOSIS — Z510 Encounter for antineoplastic radiation therapy: Secondary | ICD-10-CM | POA: Diagnosis not present

## 2015-10-07 ENCOUNTER — Ambulatory Visit
Admission: RE | Admit: 2015-10-07 | Discharge: 2015-10-07 | Disposition: A | Discharge status: Home / Self Care | Payer: Medicare Other | Source: Ambulatory Visit | Attending: Radiation Oncology | Admitting: Radiation Oncology

## 2015-10-07 DIAGNOSIS — Z510 Encounter for antineoplastic radiation therapy: Secondary | ICD-10-CM | POA: Diagnosis not present

## 2015-10-08 ENCOUNTER — Inpatient Hospital Stay: Payer: Medicare Other

## 2015-10-08 ENCOUNTER — Ambulatory Visit
Admission: RE | Admit: 2015-10-08 | Discharge: 2015-10-08 | Disposition: A | Discharge status: Home / Self Care | Payer: Medicare Other | Source: Ambulatory Visit | Attending: Radiation Oncology | Admitting: Radiation Oncology

## 2015-10-08 ENCOUNTER — Inpatient Hospital Stay (HOSPITAL_BASED_OUTPATIENT_CLINIC_OR_DEPARTMENT_OTHER): Payer: Medicare Other | Admitting: Oncology

## 2015-10-08 VITALS — BP 123/67 | HR 57 | Temp 95.5°F | Resp 18 | Wt 224.4 lb

## 2015-10-08 DIAGNOSIS — Z510 Encounter for antineoplastic radiation therapy: Secondary | ICD-10-CM | POA: Diagnosis not present

## 2015-10-08 DIAGNOSIS — F329 Major depressive disorder, single episode, unspecified: Secondary | ICD-10-CM

## 2015-10-08 DIAGNOSIS — D709 Neutropenia, unspecified: Secondary | ICD-10-CM | POA: Diagnosis not present

## 2015-10-08 DIAGNOSIS — K5900 Constipation, unspecified: Secondary | ICD-10-CM

## 2015-10-08 DIAGNOSIS — C7A1 Malignant poorly differentiated neuroendocrine tumors: Secondary | ICD-10-CM | POA: Diagnosis not present

## 2015-10-08 DIAGNOSIS — Z79899 Other long term (current) drug therapy: Secondary | ICD-10-CM

## 2015-10-08 DIAGNOSIS — T451X5S Adverse effect of antineoplastic and immunosuppressive drugs, sequela: Secondary | ICD-10-CM | POA: Diagnosis not present

## 2015-10-08 DIAGNOSIS — Z5111 Encounter for antineoplastic chemotherapy: Secondary | ICD-10-CM | POA: Diagnosis not present

## 2015-10-08 DIAGNOSIS — K219 Gastro-esophageal reflux disease without esophagitis: Secondary | ICD-10-CM

## 2015-10-08 DIAGNOSIS — G4730 Sleep apnea, unspecified: Secondary | ICD-10-CM

## 2015-10-08 DIAGNOSIS — I10 Essential (primary) hypertension: Secondary | ICD-10-CM

## 2015-10-08 LAB — CBC WITH DIFFERENTIAL/PLATELET
BASOS ABS: 0 10*3/uL (ref 0–0.1)
Eosinophils Absolute: 0 10*3/uL (ref 0–0.7)
HEMATOCRIT: 36.4 % — AB (ref 40.0–52.0)
Hemoglobin: 13.1 g/dL (ref 13.0–18.0)
Lymphocytes Relative: 21 %
Lymphs Abs: 0.6 10*3/uL — ABNORMAL LOW (ref 1.0–3.6)
MCH: 31.3 pg (ref 26.0–34.0)
MCHC: 36.1 g/dL — ABNORMAL HIGH (ref 32.0–36.0)
MCV: 86.6 fL (ref 80.0–100.0)
MONO ABS: 0.5 10*3/uL (ref 0.2–1.0)
Monocytes Relative: 20 %
NEUTROS ABS: 1.6 10*3/uL (ref 1.4–6.5)
Neutrophils Relative %: 58 %
PLATELETS: 202 10*3/uL (ref 150–440)
RBC: 4.2 MIL/uL — ABNORMAL LOW (ref 4.40–5.90)
RDW: 14.1 % (ref 11.5–14.5)
WBC: 2.8 10*3/uL — ABNORMAL LOW (ref 3.8–10.6)

## 2015-10-08 LAB — COMPREHENSIVE METABOLIC PANEL
ALBUMIN: 4.1 g/dL (ref 3.5–5.0)
ALT: 23 U/L (ref 17–63)
AST: 21 U/L (ref 15–41)
Alkaline Phosphatase: 44 U/L (ref 38–126)
Anion gap: 7 (ref 5–15)
BUN: 9 mg/dL (ref 6–20)
CHLORIDE: 98 mmol/L — AB (ref 101–111)
CO2: 28 mmol/L (ref 22–32)
Calcium: 9.2 mg/dL (ref 8.9–10.3)
Creatinine, Ser: 1 mg/dL (ref 0.61–1.24)
GFR calc Af Amer: >60 mL/min (ref >60)
GFR calc non Af Amer: >60 mL/min (ref >60)
GLUCOSE: 98 mg/dL (ref 65–99)
POTASSIUM: 4 mmol/L (ref 3.5–5.1)
SODIUM: 133 mmol/L — AB (ref 135–145)
Total Bilirubin: 0.6 mg/dL (ref 0.3–1.2)
Total Protein: 7.2 g/dL (ref 6.5–8.1)

## 2015-10-08 MED ORDER — SODIUM CHLORIDE 0.9 % IV SOLN
Freq: Once | INTRAVENOUS | Status: AC
  Administered 2015-10-08: 10:00:00 via INTRAVENOUS
  Filled 2015-10-08: qty 1000

## 2015-10-08 MED ORDER — DEXAMETHASONE SODIUM PHOSPHATE 100 MG/10ML IJ SOLN
10.0000 mg | Freq: Once | INTRAMUSCULAR | Status: AC
  Administered 2015-10-08: 10 mg via INTRAVENOUS
  Filled 2015-10-08: qty 1

## 2015-10-08 MED ORDER — PALONOSETRON HCL INJECTION 0.25 MG/5ML
0.2500 mg | Freq: Once | INTRAVENOUS | Status: AC
  Administered 2015-10-08: 0.25 mg via INTRAVENOUS
  Filled 2015-10-08: qty 5

## 2015-10-08 MED ORDER — SODIUM CHLORIDE 0.9 % IV SOLN
100.0000 mg/m2 | Freq: Once | INTRAVENOUS | Status: AC
  Administered 2015-10-08: 230 mg via INTRAVENOUS
  Filled 2015-10-08: qty 11.5

## 2015-10-08 MED ORDER — SODIUM CHLORIDE 0.9 % IV SOLN
641.5000 mg | Freq: Once | INTRAVENOUS | Status: AC
  Administered 2015-10-08: 640 mg via INTRAVENOUS
  Filled 2015-10-08: qty 64

## 2015-10-08 NOTE — Progress Notes (Signed)
States had mouth sores and Dr. Baruch Gouty prescribed carafate. Pt states that mouth sores are better after starting carafate.

## 2015-10-08 NOTE — Progress Notes (Signed)
[place]  [phone]) [phone] [phone]) [phone]  ID: MARY HOCKEY OB: 1946-01-20  MR#: [id number]  AOZ#:[id number]  Patient Care Team: Kirk Ruths, MD as PCP - General (Internal Medicine)  CHIEF COMPLAINT: Stage IV neuroendocrine tumor, consistent with small cell carcinoma with parotid metastasis.  INTERVAL HISTORY: Patient returns to clinic today for further evaluation and consideration of cycle 3 of carboplatinum and etoposide. Patient continues to feel well and is asymptomatic.  He has no neurologic complaints. He denies any recent fevers or illnesses. He has a good appetite and denies weight loss. He denies any pain. He denies any chest pain or shortness of breath. He denies any nausea, vomiting, or diarrhea. He has no urinary complaints. Patient offers no specific complaints today.  REVIEW OF SYSTEMS:   Review of Systems  Constitutional: Negative.  Negative for fever, malaise/fatigue and weight loss.  HENT: Negative.  Negative for sore throat.   Respiratory: Negative.  Negative for hemoptysis and shortness of breath.   Cardiovascular: Negative.  Negative for chest pain.  Gastrointestinal: Positive for constipation. Negative for abdominal pain, blood in stool, diarrhea, melena, nausea and vomiting.  Genitourinary: Negative.   Musculoskeletal: Negative.   Neurological: Negative.  Negative for weakness and headaches.  Psychiatric/Behavioral: Negative.     As per HPI. Otherwise, a complete review of systems is negatve.  PAST MEDICAL HISTORY: Past Medical History:  Diagnosis Date  . Cancer ([facility])    PROSTATE,BASAL CELL-FOREHEAD, RIGHT EYE-SQUAMOUS CELL RIGHT EAR  . Depression    Since 04/12  . GERD (gastroesophageal reflux disease)    H/O  . Hypertension   . Sleep apnea    USES CPAP    PAST SURGICAL HISTORY: Past Surgical History:  Procedure Laterality Date  . COLONOSCOPY    . EYE SURGERY Bilateral    CATARACTS  . PAROTIDECTOMY Right  07/21/2015   Procedure: PAROTIDECTOMY;  Surgeon: Margaretha Sheffield, MD;  Location: [facility] ORS;  Service: ENT;  Laterality: Right;  . PENILE PROSTHESIS IMPLANT    . PROSTATECTOMY    . TOE SURGERY      FAMILY HISTORY: Reviewed and unchanged. No reported history of malignancy or chronic disease.     ADVANCED DIRECTIVES:    HEALTH MAINTENANCE: Social History  Substance Use Topics  . Smoking status: Never Smoker  . Smokeless tobacco: Not on file  . Alcohol use No     Colonoscopy:  PAP:  Bone density:  Lipid panel:  Allergies  Allergen Reactions  . Codeine Other (See Comments)    Hallucinations.  Freida Busman [Terbinafine Hcl] Rash  . Penicillins Rash    Current Outpatient Prescriptions  Medication Sig Dispense Refill  . amLODipine (NORVASC) 5 MG tablet Take 5 mg by mouth every morning.     . desvenlafaxine (PRISTIQ) 50 MG 24 hr tablet Take 50 mg by mouth every morning.     . prochlorperazine (COMPAZINE) 10 MG tablet Take 1 tablet (10 mg total) by mouth every 6 (six) hours as needed (Nausea or vomiting). 30 tablet 1  . sucralfate (CARAFATE) 1 g tablet Take 1 tablet (1 g total) by mouth 3 (three) times daily. Dissolve in 2-3 tbsp of warm water; swish and swallow 90 tablet 3  . testosterone (ANDRODERM) 4 MG/24HR PT24 patch Place 1 patch onto the skin daily.    . traZODone (DESYREL) 150 MG tablet Take by mouth at bedtime.     No current facility-administered medications for this visit.     OBJECTIVE: Vitals:  10/08/15 0855  BP: 123/67  Pulse: (!) 57  Resp: 18  Temp: (!) 95.5 F (35.3 C)     Body mass index is 32.13 kg/m.    ECOG FS:0 - Asymptomatic  General: Well-developed, well-nourished, no acute distress. Eyes: Pink conjunctiva, anicteric sclera. HEENT: Normocephalic, moist mucous membranes, clear oropharnyx. Well-healed surgical scar on right neck. Lungs: Clear to auscultation bilaterally. Heart: Regular rate and rhythm. No rubs, murmurs, or gallops. Abdomen: Soft,  nontender, nondistended. No organomegaly noted, normoactive bowel sounds. Musculoskeletal: No edema, cyanosis, or clubbing. Neuro: Alert, answering all questions appropriately. Cranial nerves grossly intact. Skin: No rashes or petechiae noted. Psych: Normal affect.   LAB RESULTS:  Lab Results  Component Value Date   NA 133 (L) 10/08/2015   K 4.0 10/08/2015   CL 98 (L) 10/08/2015   CO2 28 10/08/2015   GLUCOSE 98 10/08/2015   BUN 9 10/08/2015   CREATININE 1.00 10/08/2015   CALCIUM 9.2 10/08/2015   PROT 7.2 10/08/2015   ALBUMIN 4.1 10/08/2015   AST 21 10/08/2015   ALT 23 10/08/2015   ALKPHOS 44 10/08/2015   BILITOT 0.6 10/08/2015   GFRNONAA >60 10/08/2015   GFRAA >60 10/08/2015    Lab Results  Component Value Date   WBC 2.8 (L) 10/08/2015   NEUTROABS 1.6 10/08/2015   HGB 13.1 10/08/2015   HCT 36.4 (L) 10/08/2015   MCV 86.6 10/08/2015   PLT 202 10/08/2015     STUDIES: No results found.  ASSESSMENT: Stage IV neuroendocrine tumor, consistent with small cell carcinoma with parotid metastasis.  PLAN:    1. Stage IV neuroendocrine tumor, consistent with small cell carcinoma with parotid metastasis: Pathology and imaging results reviewed independently. PET scan results reviewed independently and reported as above. EUS biopsy of lymph node and porta hepatis revealed a low-grade neuroendocrine tumor slightly different than the high-grade/small cell carcinoma noted in his parotid gland. This is likely his primary and the parotid lesion is metastasis. MRI the brain reviewed independently without metastatic disease. Proceed with cycle 3 of carboplatinum and etoposide today. Return to clinic in 1 and 2 days for etoposide only. Continue XRT completing on October 23, 2015. Patient will return to clinic in 8 weeks with repeat imaging using PET scan and further evaluation.    2. Constipation: Resolved. Continue current treatment as needed. 3. Neutropenia: Secondary to chemotherapy.  Neulasta on hold secondary to daily XRT. Continue treatment as above.  Patient expressed understanding and was in agreement with this plan. He also understands that He can call clinic at any time with any questions, concerns, or complaints.    Lloyd Huger, MD   10/08/2015 9:00 AM

## 2015-10-09 ENCOUNTER — Inpatient Hospital Stay: Payer: Medicare Other

## 2015-10-09 ENCOUNTER — Ambulatory Visit
Admission: RE | Admit: 2015-10-09 | Discharge: 2015-10-09 | Disposition: A | Discharge status: Home / Self Care | Payer: Medicare Other | Source: Ambulatory Visit | Attending: Radiation Oncology | Admitting: Radiation Oncology

## 2015-10-09 ENCOUNTER — Ambulatory Visit: Payer: Self-pay

## 2015-10-09 VITALS — BP 117/63 | HR 62 | Temp 97.2°F

## 2015-10-09 DIAGNOSIS — C7A1 Malignant poorly differentiated neuroendocrine tumors: Secondary | ICD-10-CM

## 2015-10-09 DIAGNOSIS — Z510 Encounter for antineoplastic radiation therapy: Secondary | ICD-10-CM | POA: Diagnosis not present

## 2015-10-09 DIAGNOSIS — Z5111 Encounter for antineoplastic chemotherapy: Secondary | ICD-10-CM | POA: Diagnosis not present

## 2015-10-09 MED ORDER — SODIUM CHLORIDE 0.9 % IV SOLN
Freq: Once | INTRAVENOUS | Status: AC
  Administered 2015-10-09: 14:00:00 via INTRAVENOUS
  Filled 2015-10-09: qty 1000

## 2015-10-09 MED ORDER — ETOPOSIDE CHEMO INJECTION 1 GM/50ML
100.0000 mg/m2 | Freq: Once | INTRAVENOUS | Status: AC
  Administered 2015-10-09: 230 mg via INTRAVENOUS
  Filled 2015-10-09: qty 11.5

## 2015-10-09 MED ORDER — SODIUM CHLORIDE 0.9 % IV SOLN
10.0000 mg | Freq: Once | INTRAVENOUS | Status: AC
  Administered 2015-10-09: 10 mg via INTRAVENOUS
  Filled 2015-10-09: qty 1

## 2015-10-10 ENCOUNTER — Ambulatory Visit
Admission: RE | Admit: 2015-10-10 | Discharge: 2015-10-10 | Disposition: A | Discharge status: Home / Self Care | Payer: Medicare Other | Source: Ambulatory Visit | Attending: Radiation Oncology | Admitting: Radiation Oncology

## 2015-10-10 ENCOUNTER — Ambulatory Visit: Payer: Self-pay

## 2015-10-10 ENCOUNTER — Inpatient Hospital Stay: Payer: Medicare Other

## 2015-10-10 VITALS — BP 108/61 | HR 53 | Temp 97.0°F

## 2015-10-10 DIAGNOSIS — C7A1 Malignant poorly differentiated neuroendocrine tumors: Secondary | ICD-10-CM

## 2015-10-10 DIAGNOSIS — Z510 Encounter for antineoplastic radiation therapy: Secondary | ICD-10-CM | POA: Diagnosis not present

## 2015-10-10 DIAGNOSIS — Z5111 Encounter for antineoplastic chemotherapy: Secondary | ICD-10-CM | POA: Diagnosis not present

## 2015-10-10 MED ORDER — SODIUM CHLORIDE 0.9 % IV SOLN
100.0000 mg/m2 | Freq: Once | INTRAVENOUS | Status: AC
  Administered 2015-10-10: 230 mg via INTRAVENOUS
  Filled 2015-10-10: qty 11.5

## 2015-10-10 MED ORDER — SODIUM CHLORIDE 0.9 % IV SOLN
10.0000 mg | Freq: Once | INTRAVENOUS | Status: AC
  Administered 2015-10-10: 10 mg via INTRAVENOUS
  Filled 2015-10-10: qty 1

## 2015-10-10 MED ORDER — SODIUM CHLORIDE 0.9 % IV SOLN
Freq: Once | INTRAVENOUS | Status: AC
  Administered 2015-10-10: 14:00:00 via INTRAVENOUS
  Filled 2015-10-10: qty 1000

## 2015-10-10 MED ORDER — SODIUM CHLORIDE 0.9 % IV SOLN: 100.0000 mg/m2 | Freq: Once | INTRAVENOUS | Status: DC

## 2015-10-13 ENCOUNTER — Ambulatory Visit
Admission: RE | Admit: 2015-10-13 | Discharge: 2015-10-13 | Disposition: A | Discharge status: Home / Self Care | Payer: Medicare Other | Source: Ambulatory Visit | Attending: Radiation Oncology | Admitting: Radiation Oncology

## 2015-10-13 DIAGNOSIS — Z510 Encounter for antineoplastic radiation therapy: Secondary | ICD-10-CM | POA: Diagnosis not present

## 2015-10-14 ENCOUNTER — Ambulatory Visit
Admission: RE | Admit: 2015-10-14 | Discharge: 2015-10-14 | Disposition: A | Discharge status: Home / Self Care | Payer: Medicare Other | Source: Ambulatory Visit | Attending: Radiation Oncology | Admitting: Radiation Oncology

## 2015-10-14 DIAGNOSIS — Z510 Encounter for antineoplastic radiation therapy: Secondary | ICD-10-CM | POA: Diagnosis not present

## 2015-10-15 ENCOUNTER — Ambulatory Visit
Admission: RE | Admit: 2015-10-15 | Discharge: 2015-10-15 | Disposition: A | Discharge status: Home / Self Care | Payer: Medicare Other | Source: Ambulatory Visit | Attending: Radiation Oncology | Admitting: Radiation Oncology

## 2015-10-15 DIAGNOSIS — Z510 Encounter for antineoplastic radiation therapy: Secondary | ICD-10-CM | POA: Diagnosis not present

## 2015-10-16 ENCOUNTER — Ambulatory Visit
Admission: RE | Admit: 2015-10-16 | Discharge: 2015-10-16 | Disposition: A | Discharge status: Home / Self Care | Payer: Medicare Other | Source: Ambulatory Visit | Attending: Radiation Oncology | Admitting: Radiation Oncology

## 2015-10-16 DIAGNOSIS — Z510 Encounter for antineoplastic radiation therapy: Secondary | ICD-10-CM | POA: Diagnosis not present

## 2015-10-17 ENCOUNTER — Ambulatory Visit
Admission: RE | Admit: 2015-10-17 | Discharge: 2015-10-17 | Disposition: A | Discharge status: Home / Self Care | Payer: Medicare Other | Source: Ambulatory Visit | Attending: Radiation Oncology | Admitting: Radiation Oncology

## 2015-10-17 DIAGNOSIS — Z510 Encounter for antineoplastic radiation therapy: Secondary | ICD-10-CM | POA: Diagnosis not present

## 2015-10-20 ENCOUNTER — Ambulatory Visit
Admission: RE | Admit: 2015-10-20 | Discharge: 2015-10-20 | Disposition: A | Discharge status: Home / Self Care | Payer: Medicare Other | Source: Ambulatory Visit | Attending: Radiation Oncology | Admitting: Radiation Oncology

## 2015-10-20 DIAGNOSIS — Z510 Encounter for antineoplastic radiation therapy: Secondary | ICD-10-CM | POA: Diagnosis not present

## 2015-10-21 ENCOUNTER — Ambulatory Visit
Admission: RE | Admit: 2015-10-21 | Discharge: 2015-10-21 | Disposition: A | Discharge status: Home / Self Care | Payer: Medicare Other | Source: Ambulatory Visit | Attending: Radiation Oncology | Admitting: Radiation Oncology

## 2015-10-21 ENCOUNTER — Other Ambulatory Visit: Payer: Self-pay | Admitting: *Deleted

## 2015-10-21 DIAGNOSIS — Z510 Encounter for antineoplastic radiation therapy: Secondary | ICD-10-CM | POA: Diagnosis not present

## 2015-10-21 DIAGNOSIS — D490 Neoplasm of unspecified behavior of digestive system: Secondary | ICD-10-CM

## 2015-10-21 MED ORDER — SILVER SULFADIAZINE 1 % EX CREA: 1.0000 "application " | TOPICAL_CREAM | Freq: Two times a day (BID) | CUTANEOUS | 2 refills | Status: DC

## 2015-10-22 ENCOUNTER — Ambulatory Visit
Admission: RE | Admit: 2015-10-22 | Discharge: 2015-10-22 | Disposition: A | Discharge status: Home / Self Care | Payer: Medicare Other | Source: Ambulatory Visit | Attending: Radiation Oncology | Admitting: Radiation Oncology

## 2015-10-22 DIAGNOSIS — Z510 Encounter for antineoplastic radiation therapy: Secondary | ICD-10-CM | POA: Diagnosis not present

## 2015-10-23 ENCOUNTER — Ambulatory Visit
Admission: RE | Admit: 2015-10-23 | Discharge: 2015-10-23 | Disposition: A | Discharge status: Home / Self Care | Payer: Medicare Other | Source: Ambulatory Visit | Attending: Radiation Oncology | Admitting: Radiation Oncology

## 2015-10-23 DIAGNOSIS — Z510 Encounter for antineoplastic radiation therapy: Secondary | ICD-10-CM | POA: Diagnosis not present

## 2015-11-28 ENCOUNTER — Ambulatory Visit
Admission: RE | Admit: 2015-11-28 | Discharge: 2015-11-28 | Disposition: A | Discharge status: Home / Self Care | Payer: Medicare Other | Source: Ambulatory Visit | Attending: Oncology | Admitting: Oncology

## 2015-11-28 DIAGNOSIS — K8020 Calculus of gallbladder without cholecystitis without obstruction: Secondary | ICD-10-CM | POA: Diagnosis not present

## 2015-11-28 DIAGNOSIS — Z9889 Other specified postprocedural states: Secondary | ICD-10-CM | POA: Diagnosis not present

## 2015-11-28 DIAGNOSIS — I517 Cardiomegaly: Secondary | ICD-10-CM | POA: Insufficient documentation

## 2015-11-28 DIAGNOSIS — X58XXXA Exposure to other specified factors, initial encounter: Secondary | ICD-10-CM | POA: Diagnosis not present

## 2015-11-28 DIAGNOSIS — R590 Localized enlarged lymph nodes: Secondary | ICD-10-CM | POA: Insufficient documentation

## 2015-11-28 DIAGNOSIS — H748X3 Other specified disorders of middle ear and mastoid, bilateral: Secondary | ICD-10-CM | POA: Insufficient documentation

## 2015-11-28 DIAGNOSIS — S2231XA Fracture of one rib, right side, initial encounter for closed fracture: Secondary | ICD-10-CM | POA: Diagnosis not present

## 2015-11-28 DIAGNOSIS — R1909 Other intra-abdominal and pelvic swelling, mass and lump: Secondary | ICD-10-CM | POA: Diagnosis not present

## 2015-11-28 DIAGNOSIS — I2510 Atherosclerotic heart disease of native coronary artery without angina pectoris: Secondary | ICD-10-CM | POA: Insufficient documentation

## 2015-11-28 DIAGNOSIS — C7A1 Malignant poorly differentiated neuroendocrine tumors: Secondary | ICD-10-CM | POA: Diagnosis present

## 2015-11-28 DIAGNOSIS — I700 Atherosclerosis of aorta: Secondary | ICD-10-CM | POA: Diagnosis not present

## 2015-11-28 LAB — GLUCOSE, CAPILLARY: Glucose-Capillary: 99 mg/dL (ref 65–99)

## 2015-11-28 MED ORDER — FLUDEOXYGLUCOSE F - 18 (FDG) INJECTION
12.6800 | Freq: Once | INTRAVENOUS | Status: AC | PRN
  Administered 2015-11-28: 12.68 via INTRAVENOUS

## 2015-12-01 NOTE — Progress Notes (Signed)
[place]  Telephone:(336) [phone] Fax:(336) [phone]  ID: Alex Underwood OB: February 08, 1946  MR#: [id number]  [id number]#:[id number]  Patient Care Team: Kirk Ruths, MD as PCP - General (Internal Medicine)  CHIEF COMPLAINT: Stage IV neuroendocrine tumor, consistent with small cell carcinoma with parotid metastasis.  INTERVAL HISTORY: Patient returns to clinic today for further evaluation and discussion of his PET scan results. Patient continues to feel well and is asymptomatic.  He has no neurologic complaints. He denies any recent fevers or illnesses. He has a good appetite and denies weight loss. He denies any pain. He denies any chest pain or shortness of breath. He denies any nausea, vomiting, or diarrhea. He has no urinary complaints. Patient offers no specific complaints today.  REVIEW OF SYSTEMS:   Review of Systems  Constitutional: Negative.  Negative for fever, malaise/fatigue and weight loss.  HENT: Negative.  Negative for sore throat.   Respiratory: Negative.  Negative for hemoptysis and shortness of breath.   Cardiovascular: Negative.  Negative for chest pain.  Gastrointestinal: Negative for abdominal pain, blood in stool, constipation, diarrhea, melena, nausea and vomiting.  Genitourinary: Negative.   Musculoskeletal: Negative.   Neurological: Negative.  Negative for weakness and headaches.  Psychiatric/Behavioral: Negative.  The patient is not nervous/anxious.     As per HPI. Otherwise, a complete review of systems is negative.  PAST MEDICAL HISTORY: Past Medical History:  Diagnosis Date  . Cancer ([facility])    PROSTATE,BASAL CELL-FOREHEAD, RIGHT EYE-SQUAMOUS CELL RIGHT EAR  . Depression    Since 04/12  . GERD (gastroesophageal reflux disease)    H/O  . Hypertension   . Sleep apnea    USES CPAP    PAST SURGICAL HISTORY: Past Surgical History:  Procedure Laterality Date  . COLONOSCOPY    . EYE SURGERY Bilateral    CATARACTS  . PAROTIDECTOMY  Right 07/21/2015   Procedure: PAROTIDECTOMY;  Surgeon: Margaretha Sheffield, MD;  Location: [facility] ORS;  Service: ENT;  Laterality: Right;  . PENILE PROSTHESIS IMPLANT    . PROSTATECTOMY    . TOE SURGERY      FAMILY HISTORY: Reviewed and unchanged. No reported history of malignancy or chronic disease.     ADVANCED DIRECTIVES:    HEALTH MAINTENANCE: Social History  Substance Use Topics  . Smoking status: Never Smoker  . Smokeless tobacco: Not on file  . Alcohol use No     Colonoscopy:  PAP:  Bone density:  Lipid panel:  Allergies  Allergen Reactions  . Codeine Other (See Comments)    Hallucinations.  Freida Busman [Terbinafine Hcl] Rash  . Penicillins Rash    Current Outpatient Prescriptions  Medication Sig Dispense Refill  . amLODipine (NORVASC) 5 MG tablet Take 5 mg by mouth every morning.     . desvenlafaxine (PRISTIQ) 50 MG 24 hr tablet Take 50 mg by mouth every morning.     . testosterone (ANDRODERM) 4 MG/24HR PT24 patch Place 1 patch onto the skin daily.    . traZODone (DESYREL) 150 MG tablet Take by mouth at bedtime.     No current facility-administered medications for this visit.     OBJECTIVE: Vitals:   12/03/15 0944  BP: 122/69  Pulse: 67  Resp: 18  Temp: (!) 96 F (35.6 C)     Body mass index is 31.75 kg/m.    ECOG FS:0 - Asymptomatic  General: Well-developed, well-nourished, no acute distress. Eyes: Pink conjunctiva, anicteric sclera. HEENT: Normocephalic, moist mucous membranes, clear oropharnyx. Well-healed surgical scar  on right neck. Lungs: Clear to auscultation bilaterally. Heart: Regular rate and rhythm. No rubs, murmurs, or gallops. Abdomen: Soft, nontender, nondistended. No organomegaly noted, normoactive bowel sounds. Musculoskeletal: No edema, cyanosis, or clubbing. Neuro: Alert, answering all questions appropriately. Cranial nerves grossly intact. Skin: No rashes or petechiae noted. Psych: Normal affect.   LAB RESULTS:  Lab Results    Component Value Date   NA 133 (L) 12/03/2015   K 4.0 12/03/2015   CL 99 (L) 12/03/2015   CO2 27 12/03/2015   GLUCOSE 115 (H) 12/03/2015   BUN 11 12/03/2015   CREATININE 1.00 12/03/2015   CALCIUM 9.2 12/03/2015   PROT 7.5 12/03/2015   ALBUMIN 4.4 12/03/2015   AST 24 12/03/2015   ALT 20 12/03/2015   ALKPHOS 49 12/03/2015   BILITOT 0.8 12/03/2015   GFRNONAA >60 12/03/2015   GFRAA >60 12/03/2015    Lab Results  Component Value Date   WBC 3.0 (L) 12/03/2015   NEUTROABS 2.2 12/03/2015   HGB 13.4 12/03/2015   HCT 38.3 (L) 12/03/2015   MCV 92.1 12/03/2015   PLT 173 12/03/2015     STUDIES: Nm Pet Image Restag (ps) Skull Base To Thigh  Result Date: 11/28/2015 CLINICAL DATA:  Subsequent treatment strategy for neuroendocrine carcinoma. EXAM: NUCLEAR MEDICINE PET SKULL BASE TO THIGH TECHNIQUE: 12.7 mCi F-18 FDG was injected intravenously. Full-ring PET imaging was performed from the skull base to thigh after the radiotracer. CT data was obtained and used for attenuation correction and anatomic localization. FASTING BLOOD GLUCOSE:  Value: 99 mg/dl COMPARISON:  Multiple exams, including 08/06/2015 FINDINGS: NECK No hypermetabolic lymph nodes in the neck. Postoperative findings, right parotid gland. Right mastoid effusion with suspected fluid in the right middle ear, correlate with physical exam in assessing for otitis media. CHEST No hypermetabolic mediastinal or hilar nodes. No suspicious pulmonary nodules on the CT scan. Old granulomatous disease in the apical posterior segment left upper lobe. Calcified small left thyroid nodule. Coronary, aortic arch, and branch vessel atherosclerotic vascular disease. Mild cardiomegaly. Hypermetabolic activity associated with healing left anterior adjacent fourth and fifth rib fractures, correlate with interval chest trauma. Hypermetabolic activity associated with a healing or right sixth rib fracture. These were not present on 08/06/2015. ABDOMEN/PELVIS  Hypermetabolic porta hepatis lymph node measuring 1.9 cm in short axis on image 150/3 (stable size), maximum SUV 14.3, previously 14.6. No other hypermetabolic activity characteristic of malignancy identified in the abdomen or pelvis. Cholelithiasis. Aortoiliac atherosclerotic vascular disease. Penile implant reservoir in the right lower quadrant. Cystic lesion along the right pelvic sidewall was photopenic, as before. Faint stranding in the mesentery, stable bridging spurring of the sacroiliac joints. SKELETON No focal hypermetabolic activity to suggest skeletal metastasis. IMPRESSION: 1. Stable size and stable abnormal hypermetabolic activity in the porta hepatis lymph node, maximum SUV 14.3, compatible with malignancy. 2. Healing new left anterior fourth and fifth rib fractures and healing right sixth rib fracture, with associated low-grade metabolic activity favoring a posttraumatic origin. Correlate with history of interval trauma since the patient's prior PET-CT. 3. Postoperative findings in the right parotid gland region without abnormal activity in this region. 4. Right mastoid effusion with suspected fluid in the right middle ear, possible otitis media. 5. Other imaging findings of potential clinical significance: Coronary, aortic arch, and branch vessel atherosclerotic vascular disease. Mild cardiomegaly. Cholelithiasis. Aortoiliac atherosclerotic vascular disease. Penile implant. Cystic lesion along the right pelvic sidewall seems separate from the reservoir but is photopenic on PET-CT and likely benign/ incidental. Electronically Signed  By: Van Clines M.D.   On: 11/28/2015 13:50    ASSESSMENT: Stage IV neuroendocrine tumor, consistent with small cell carcinoma with parotid metastasis.  PLAN:    1. Stage IV neuroendocrine tumor, consistent with small cell carcinoma with parotid metastasis: Pathology and imaging results reviewed independently. PET scan results reviewed independently and  reported as above with persistent porta hepatis disease. Previously, EUS biopsy of lymph node in the porta hepatis revealed a low-grade neuroendocrine tumor slightly different than the high-grade/small cell carcinoma noted in his parotid gland. This was thought to be the primary lesion and the parotid lesion was metastasis. MRI the brain reviewed independently without metastatic disease. Patient completed 3 cycles of carboplatinum and etoposide on October 10, 2015.  Plan to discuss case at cancer conference tomorrow. Will observe and repeat PET scan in 3 months with follow-up 1-2 days later.    2. Constipation: Resolved. Continue current treatment as needed. 3. Leukopenia: Mild, monitor.   Approximately 30 minutes was spent in discussion of which greater than 50% was consultation.  Patient expressed understanding and was in agreement with this plan. He also understands that He can call clinic at any time with any questions, concerns, or complaints.    Lloyd Huger, MD   12/03/2015 12:56 PM

## 2015-12-03 ENCOUNTER — Inpatient Hospital Stay: Payer: Medicare Other | Attending: Oncology | Admitting: Oncology

## 2015-12-03 ENCOUNTER — Inpatient Hospital Stay: Payer: Medicare Other

## 2015-12-03 ENCOUNTER — Ambulatory Visit
Admission: RE | Admit: 2015-12-03 | Discharge: 2015-12-03 | Disposition: A | Discharge status: Home / Self Care | Payer: Medicare Other | Source: Ambulatory Visit | Attending: Radiation Oncology | Admitting: Radiation Oncology

## 2015-12-03 VITALS — BP 122/69 | HR 67 | Temp 96.0°F | Resp 18 | Wt 221.8 lb

## 2015-12-03 DIAGNOSIS — Z923 Personal history of irradiation: Secondary | ICD-10-CM | POA: Insufficient documentation

## 2015-12-03 DIAGNOSIS — Z85818 Personal history of malignant neoplasm of other sites of lip, oral cavity, and pharynx: Secondary | ICD-10-CM | POA: Diagnosis not present

## 2015-12-03 DIAGNOSIS — Z8781 Personal history of (healed) traumatic fracture: Secondary | ICD-10-CM | POA: Diagnosis not present

## 2015-12-03 DIAGNOSIS — K7689 Other specified diseases of liver: Secondary | ICD-10-CM | POA: Diagnosis not present

## 2015-12-03 DIAGNOSIS — I517 Cardiomegaly: Secondary | ICD-10-CM | POA: Diagnosis not present

## 2015-12-03 DIAGNOSIS — I10 Essential (primary) hypertension: Secondary | ICD-10-CM

## 2015-12-03 DIAGNOSIS — G4730 Sleep apnea, unspecified: Secondary | ICD-10-CM | POA: Diagnosis not present

## 2015-12-03 DIAGNOSIS — C7989 Secondary malignant neoplasm of other specified sites: Secondary | ICD-10-CM | POA: Diagnosis not present

## 2015-12-03 DIAGNOSIS — C7A092 Malignant carcinoid tumor of the stomach: Secondary | ICD-10-CM | POA: Diagnosis not present

## 2015-12-03 DIAGNOSIS — I2510 Atherosclerotic heart disease of native coronary artery without angina pectoris: Secondary | ICD-10-CM | POA: Insufficient documentation

## 2015-12-03 DIAGNOSIS — M899 Disorder of bone, unspecified: Secondary | ICD-10-CM | POA: Diagnosis not present

## 2015-12-03 DIAGNOSIS — K219 Gastro-esophageal reflux disease without esophagitis: Secondary | ICD-10-CM | POA: Diagnosis not present

## 2015-12-03 DIAGNOSIS — D72819 Decreased white blood cell count, unspecified: Secondary | ICD-10-CM

## 2015-12-03 DIAGNOSIS — K8020 Calculus of gallbladder without cholecystitis without obstruction: Secondary | ICD-10-CM | POA: Diagnosis not present

## 2015-12-03 DIAGNOSIS — C7A1 Malignant poorly differentiated neuroendocrine tumors: Secondary | ICD-10-CM | POA: Diagnosis not present

## 2015-12-03 DIAGNOSIS — Z85828 Personal history of other malignant neoplasm of skin: Secondary | ICD-10-CM

## 2015-12-03 DIAGNOSIS — Z79899 Other long term (current) drug therapy: Secondary | ICD-10-CM

## 2015-12-03 DIAGNOSIS — F329 Major depressive disorder, single episode, unspecified: Secondary | ICD-10-CM | POA: Diagnosis not present

## 2015-12-03 LAB — COMPREHENSIVE METABOLIC PANEL
ALK PHOS: 49 U/L (ref 38–126)
ALT: 20 U/L (ref 17–63)
AST: 24 U/L (ref 15–41)
Albumin: 4.4 g/dL (ref 3.5–5.0)
Anion gap: 7 (ref 5–15)
BUN: 11 mg/dL (ref 6–20)
CALCIUM: 9.2 mg/dL (ref 8.9–10.3)
CO2: 27 mmol/L (ref 22–32)
CREATININE: 1 mg/dL (ref 0.61–1.24)
Chloride: 99 mmol/L — ABNORMAL LOW (ref 101–111)
GFR calc non Af Amer: >60 mL/min (ref >60)
Glucose, Bld: 115 mg/dL — ABNORMAL HIGH (ref 65–99)
Potassium: 4 mmol/L (ref 3.5–5.1)
SODIUM: 133 mmol/L — AB (ref 135–145)
Total Bilirubin: 0.8 mg/dL (ref 0.3–1.2)
Total Protein: 7.5 g/dL (ref 6.5–8.1)

## 2015-12-03 LAB — CBC WITH DIFFERENTIAL/PLATELET
Basophils Absolute: 0 10*3/uL (ref 0–0.1)
Basophils Relative: 1 %
EOS ABS: 0.1 10*3/uL (ref 0–0.7)
Eosinophils Relative: 3 %
HEMATOCRIT: 38.3 % — AB (ref 40.0–52.0)
HEMOGLOBIN: 13.4 g/dL (ref 13.0–18.0)
LYMPHS ABS: 0.4 10*3/uL — AB (ref 1.0–3.6)
LYMPHS PCT: 15 %
MCH: 32.2 pg (ref 26.0–34.0)
MCHC: 35 g/dL (ref 32.0–36.0)
MCV: 92.1 fL (ref 80.0–100.0)
Monocytes Absolute: 0.2 10*3/uL (ref 0.2–1.0)
Monocytes Relative: 8 %
NEUTROS ABS: 2.2 10*3/uL (ref 1.4–6.5)
NEUTROS PCT: 73 %
Platelets: 173 10*3/uL (ref 150–440)
RBC: 4.16 MIL/uL — AB (ref 4.40–5.90)
RDW: 15.4 % — ABNORMAL HIGH (ref 11.5–14.5)
WBC: 3 10*3/uL — AB (ref 3.8–10.6)

## 2015-12-03 NOTE — Progress Notes (Signed)
States is feeling well. Offers no complaints. 

## 2015-12-03 NOTE — Progress Notes (Signed)
Radiation Oncology Follow up Note  Name: Alex Underwood   Date:   12/03/2015 MRN:  [id number] DOB: [birth year]    This 70 y.o. male presents to the clinic today for one-month follow-up status post radiation therapy to his right neck and parotid gland for small cell undifferentiated carcinoma.  REFERRING PROVIDER: Kirk Ruths, MD  HPI: Patient is a 70 year old male now out 1 month having completed radiation therapy to his right neck and parotid gland for small cell undifferentiated carcinoma.Marland Kitchen He received concurrent chemotherapy carboplatinum and etoposide. He is doing well. The mouth sores he had during treatments have cleared. He specifically denies any head and neck pain or dysphagia. He does have a known carcinoid in his abdominal cavity which is being monitored. He specifically denies any significant change in taste at this time.  COMPLICATIONS OF TREATMENT: none  FOLLOW UP COMPLIANCE: keeps appointments   PHYSICAL EXAM:  There were no vitals taken for this visit. Oral cavity shows no oral mucositis oral lesions. Indirect mirror examination is difficult secondary to trismus. He does have some thickening of his right neck although no evidence of mass or nodularity is identified. No evidence of adenopathy in the sub-digastric cervical or supra clavicular chains are noted. Well-developed well-nourished patient in NAD. HEENT reveals PERLA, EOMI, discs not visualized.  Oral cavity is clear. No oral mucosal lesions are identified. Neck is clear without evidence of cervical or supraclavicular adenopathy. Lungs are clear to A&P. Cardiac examination is essentially unremarkable with regular rate and rhythm without murmur rub or thrill. Abdomen is benign with no organomegaly or masses noted. Motor sensory and DTR levels are equal and symmetric in the upper and lower extremities. Cranial nerves II through XII are grossly intact. Proprioception is intact. No peripheral adenopathy or edema is  identified. No motor or sensory levels are noted. Crude visual fields are within normal range.  RADIOLOGY RESULTS: Follow-up PET/CT scan has been ordered and will be reviewed independently  PLAN: Present time he is doing well with no evidence of disease. I am please was overall progress. I've asked to see him back in 4-5 months for follow-up. He continues close follow-up care with medical oncology. Follow-up PET scan has been ordered. We opted not to treat whole brain prophylactically will continue to monitor that also. Patient knows to call with any concerns.  I would like to take this opportunity to thank you for allowing me to participate in the care of your patient.Armstead Peaks., MD

## 2015-12-11 ENCOUNTER — Encounter: Payer: Self-pay | Admitting: Oncology

## 2015-12-19 ENCOUNTER — Emergency Department
Admission: EM | Admit: 2015-12-19 | Discharge: 2015-12-20 | Disposition: A | Discharge status: Home / Self Care | Payer: Medicare Other | Attending: Emergency Medicine | Admitting: Emergency Medicine

## 2015-12-19 DIAGNOSIS — Z79899 Other long term (current) drug therapy: Secondary | ICD-10-CM | POA: Diagnosis not present

## 2015-12-19 DIAGNOSIS — Z8546 Personal history of malignant neoplasm of prostate: Secondary | ICD-10-CM | POA: Diagnosis not present

## 2015-12-19 DIAGNOSIS — I10 Essential (primary) hypertension: Secondary | ICD-10-CM | POA: Insufficient documentation

## 2015-12-19 DIAGNOSIS — R200 Anesthesia of skin: Secondary | ICD-10-CM | POA: Diagnosis present

## 2015-12-19 DIAGNOSIS — R202 Paresthesia of skin: Secondary | ICD-10-CM

## 2015-12-19 LAB — CBC
HCT: 38.2 % — ABNORMAL LOW (ref 40.0–52.0)
Hemoglobin: 13.5 g/dL (ref 13.0–18.0)
MCH: 32.4 pg (ref 26.0–34.0)
MCHC: 35.4 g/dL (ref 32.0–36.0)
MCV: 91.6 fL (ref 80.0–100.0)
PLATELETS: 173 10*3/uL (ref 150–440)
RBC: 4.17 MIL/uL — ABNORMAL LOW (ref 4.40–5.90)
RDW: 13.2 % (ref 11.5–14.5)
WBC: 5 10*3/uL (ref 3.8–10.6)

## 2015-12-19 LAB — BASIC METABOLIC PANEL
Anion gap: 6 (ref 5–15)
BUN: 11 mg/dL (ref 6–20)
CALCIUM: 9.2 mg/dL (ref 8.9–10.3)
CHLORIDE: 95 mmol/L — AB (ref 101–111)
CO2: 29 mmol/L (ref 22–32)
CREATININE: 0.95 mg/dL (ref 0.61–1.24)
GFR calc Af Amer: >60 mL/min (ref >60)
GFR calc non Af Amer: >60 mL/min (ref >60)
Glucose, Bld: 110 mg/dL — ABNORMAL HIGH (ref 65–99)
Potassium: 3.9 mmol/L (ref 3.5–5.1)
SODIUM: 130 mmol/L — AB (ref 135–145)

## 2015-12-19 LAB — TROPONIN I: Troponin I: <0.03 ng/mL (ref <0.03)

## 2015-12-19 NOTE — ED Triage Notes (Addendum)
Pt reports left arm numbness after working in the yard. States that yesterday he had episodes of sob and dizziness after mowing the lawn.  Denies chest pain.  Pain in left arm worsens with movement.

## 2015-12-19 NOTE — ED Provider Notes (Signed)
[facility] Emergency Department Provider Note   ____________________________________________   First MD Initiated Contact with Patient 12/19/15 2316     (approximate)  I have reviewed the triage vital signs and the nursing notes.   HISTORY  Chief Complaint Numbness and Arm Pain    HPI Alex Underwood is a 70 y.o. male who presents to the ED from home with a chief complaint of left arm numbness. Reports playing golf in the heat 3 days ago. The following day he try to rest and hydrate because he was feeling tingling in both arms and cramps in his legs. Yesterday he mowed his yard and had a brief episode of shortness of breath and dizziness after mowing the lawn. Denies associated chest pain, diaphoresis, nausea or vomiting. Has not experienced shortness of breath since. Today he sat down in a chair and put his left elbow down on a hard armrest. After period of time he began to experience numbness from his elbow radiating up to his shoulder and down to his hand.He shook his arm and the numbness resolved. Denies slurred speech, extremity weakness or falling. Denies recent fever, chills, headache, vision changes, neck pain, abdominal pain, diarrhea. Denies recent travel or trauma. Nothing makes his symptoms worse, shaking his left arm made his tingling better.   Past Medical History:  Diagnosis Date  . Cancer (HCC)    PROSTATE,BASAL CELL-FOREHEAD, RIGHT EYE-SQUAMOUS CELL RIGHT EAR  . Depression    Since 04/12  . GERD (gastroesophageal reflux disease)    H/O  . Hypertension   . Sleep apnea    USES CPAP    Patient Active Problem List   Diagnosis Date Noted  . Malignant poorly differentiated neuroendocrine carcinoma ([place]) 08/24/2015  . 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 Major depressive disorder, recurrent ([place]) 02/01/2011  . Generalized anxiety disorder 02/01/2011  . Alcohol abuse 02/01/2011    Past Surgical History:  Procedure Laterality Date  .  COLONOSCOPY    . EYE SURGERY Bilateral    CATARACTS  . PAROTIDECTOMY Right 07/21/2015   Procedure: PAROTIDECTOMY;  Surgeon: Margaretha Sheffield, MD;  Location: [facility] ORS;  Service: ENT;  Laterality: Right;  . PENILE PROSTHESIS IMPLANT    . PROSTATECTOMY    . TOE SURGERY      Prior to Admission medications   Medication Sig Start Date End Date Taking? Authorizing Provider  amLODipine (NORVASC) 5 MG tablet Take 5 mg by mouth every morning.    Yes Historical Provider, MD  desvenlafaxine (PRISTIQ) 50 MG 24 hr tablet Take 50 mg by mouth every morning.    Yes Historical Provider, MD  testosterone (ANDRODERM) 4 MG/24HR PT24 patch Place 1 patch onto the skin daily.   Yes Historical Provider, MD  traZODone (DESYREL) 150 MG tablet Take by mouth at bedtime.   Yes Historical Provider, MD    Allergies Codeine; Lamisil [terbinafine hcl]; and Penicillins  No family history on file.  Social History Social History  Substance Use Topics  . Smoking status: Never Smoker  . Smokeless tobacco: Not on file  . Alcohol use No    Review of Systems  Constitutional: No fever/chills. Eyes: No visual changes. ENT: No sore throat. Cardiovascular: Denies chest pain. Respiratory: Denies shortness of breath. Gastrointestinal: No abdominal pain.  No nausea, no vomiting.  No diarrhea.  No constipation. Genitourinary: Negative for dysuria. Musculoskeletal: Negative for back pain. Skin: Negative for rash. Neurological: Positive for LUE tingling. Negative for headaches, focal weakness or numbness.  10-point ROS  otherwise negative.  ____________________________________________   PHYSICAL EXAM:  VITAL SIGNS: ED Triage Vitals  Enc Vitals Group     BP 12/19/15 1657 (!) 119/57     Pulse Rate 12/19/15 1657 65     Resp 12/19/15 1657 16     Temp 12/19/15 1657 97.9 F (36.6 C)     Temp Source 12/19/15 1657 Oral     SpO2 12/19/15 1657 100 %     Weight 12/19/15 1658 212 lb (96.2 kg)     Height 12/19/15 1658 '5\' 9"'$   (1.753 m)     Head Circumference --      Peak Flow --      Pain Score 12/19/15 1658 2     Pain Loc --      Pain Edu? --      Excl. in [place]? --     Constitutional: Alert and oriented. Well appearing and in no acute distress. Eyes: Conjunctivae are normal. PERRL. EOMI. Head: Atraumatic. Nose: No congestion/rhinnorhea. Mouth/Throat: Mucous membranes are moist.  Oropharynx non-erythematous. Neck: No stridor.  Supple neck without meningismus. No carotid bruits. Cardiovascular: Normal rate, regular rhythm. Grossly normal heart sounds.  Good peripheral circulation. Respiratory: Normal respiratory effort.  No retractions. Lungs CTAB. Gastrointestinal: Soft and nontender. No distention. No abdominal bruits. No CVA tenderness. Musculoskeletal: Full range of motion left shoulder, elbow and wrist without pain. Symmetrical grips. 5/5 motor and sensation. 2+ radial pulses. Brisk, less than 5 second capillary refill.  Neurologic:  Alert and oriented 4. Normal speech and language. No gross focal neurologic deficits are appreciated. No gait instability. Skin:  Skin is warm, dry and intact. No rash noted. Psychiatric: Mood and affect are normal. Speech and behavior are normal.  ____________________________________________   LABS (all labs ordered are listed, but only abnormal results are displayed)  Labs Reviewed  BASIC METABOLIC PANEL - Abnormal; Notable for the following:       Result Value   Sodium 130 (*)    Chloride 95 (*)    Glucose, Bld 110 (*)    All other components within normal limits  CBC - Abnormal; Notable for the following:    RBC 4.17 (*)    HCT 38.2 (*)    All other components within normal limits  TROPONIN I  TROPONIN I   ____________________________________________  EKG  ED ECG REPORT I, Brocha Gilliam J, the attending physician, personally viewed and interpreted this ECG.   Date: 12/19/2015  EKG Time: 1701  Rate: 70  Rhythm: normal EKG, normal sinus rhythm  Axis:  Normal  Intervals:right bundle branch block  LPFB  ST&T Change: Nonspecific  ____________________________________________  RADIOLOGY  None ____________________________________________   PROCEDURES  Procedure(s) performed: None  Procedures  Critical Care performed: No  ____________________________________________   INITIAL IMPRESSION / ASSESSMENT AND PLAN / ED COURSE  Pertinent labs & imaging results that were available during my care of the patient were reviewed by me and considered in my medical decision making (see chart for details).  70 year old male who presents with LUE paresthesia after putting pressure on to left elbow on a hard arm rest. He is alert and oriented with no focal neurological deficits on exam. Laboratory work is unremarkable, including repeat troponin. He is to continue daily baby aspirin and follow up closely with his PCP early next week. Strict return precautions given. Patient verbalizes understanding and agrees with plan of care.  Clinical Course     ____________________________________________   FINAL CLINICAL IMPRESSION(S) / ED DIAGNOSES  Final diagnoses:  Paresthesia      NEW MEDICATIONS STARTED DURING THIS VISIT:  New Prescriptions   No medications on file     Note:  This document was prepared using Dragon voice recognition software and may include unintentional dictation errors.    Paulette Blanch, MD 12/20/15 [phone]

## 2015-12-19 NOTE — Discharge Instructions (Signed)
1.  Drink plenty of fluids daily. °2.  Return to the ER for worsening symptoms, persistent vomiting, difficulty breathing or other concerns. °

## 2015-12-19 NOTE — ED Notes (Signed)
Pt given oral fluids.

## 2016-03-02 ENCOUNTER — Ambulatory Visit
Admission: RE | Admit: 2016-03-02 | Discharge: 2016-03-02 | Disposition: A | Discharge status: Home / Self Care | Payer: Medicare Other | Source: Ambulatory Visit | Attending: Oncology | Admitting: Oncology

## 2016-03-02 DIAGNOSIS — C7A1 Malignant poorly differentiated neuroendocrine tumors: Secondary | ICD-10-CM | POA: Diagnosis present

## 2016-03-02 LAB — GLUCOSE, CAPILLARY: Glucose-Capillary: 112 mg/dL — ABNORMAL HIGH (ref 65–99)

## 2016-03-02 MED ORDER — FLUDEOXYGLUCOSE F - 18 (FDG) INJECTION
12.1000 | Freq: Once | INTRAVENOUS | Status: AC | PRN
  Administered 2016-03-02: 12.1 via INTRAVENOUS

## 2016-03-03 NOTE — Progress Notes (Signed)
[place]  [phone] [phone]) [phone]  ID: Alex Underwood OB: 05/11/1945  MR#: [id number]  TKP#:[id number]  Patient Care Team: Kirk Ruths, MD as PCP - General (Internal Medicine)  CHIEF COMPLAINT: Stage IV neuroendocrine tumor, consistent with small cell carcinoma with parotid metastasis.  INTERVAL HISTORY: Patient returns to clinic today for further evaluation and discussion of his PET scan results. Patient continues to feel well and is asymptomatic.  He has no neurologic complaints. He denies any recent fevers or illnesses. He has a good appetite and denies weight loss. He denies any pain. He denies any chest pain or shortness of breath. He denies any nausea, vomiting, or diarrhea. He has no urinary complaints. Patient offers no specific complaints today.  REVIEW OF SYSTEMS:   Review of Systems  Constitutional: Negative.  Negative for fever, malaise/fatigue and weight loss.  HENT: Negative.  Negative for sore throat.   Respiratory: Negative.  Negative for hemoptysis and shortness of breath.   Cardiovascular: Negative.  Negative for chest pain.  Gastrointestinal: Negative for abdominal pain, blood in stool, constipation, diarrhea, melena, nausea and vomiting.  Genitourinary: Negative.   Musculoskeletal: Negative.   Neurological: Negative.  Negative for weakness and headaches.  Psychiatric/Behavioral: Negative.  The patient is not nervous/anxious.     As per HPI. Otherwise, a complete review of systems is negative.  PAST MEDICAL HISTORY: Past Medical History:  Diagnosis Date  . Cancer (HCC)    PROSTATE,BASAL CELL-FOREHEAD, RIGHT EYE-SQUAMOUS CELL RIGHT EAR  . Depression    Since 04/12  . GERD (gastroesophageal reflux disease)    H/O  . Hypertension   . Sleep apnea    USES CPAP    PAST SURGICAL HISTORY: Past Surgical History:  Procedure Laterality Date  . COLONOSCOPY    . EYE SURGERY Bilateral    CATARACTS  . PAROTIDECTOMY  Right 07/21/2015   Procedure: PAROTIDECTOMY;  Surgeon: Margaretha Sheffield, MD;  Location: [facility] ORS;  Service: ENT;  Laterality: Right;  . PENILE PROSTHESIS IMPLANT    . PROSTATECTOMY    . TOE SURGERY      FAMILY HISTORY: Reviewed and unchanged. No reported history of malignancy or chronic disease.     ADVANCED DIRECTIVES:    HEALTH MAINTENANCE: Social History  Substance Use Topics  . Smoking status: Never Smoker  . Smokeless tobacco: Not on file  . Alcohol use No     Colonoscopy:  PAP:  Bone density:  Lipid panel:  Allergies  Allergen Reactions  . Codeine Other (See Comments)    Hallucinations.  Alex Underwood [Terbinafine Hcl] Rash  . Penicillins Rash    Current Outpatient Prescriptions  Medication Sig Dispense Refill  . amLODipine (NORVASC) 5 MG tablet Take 5 mg by mouth every morning.     . desvenlafaxine (PRISTIQ) 50 MG 24 hr tablet Take 50 mg by mouth every morning.     . testosterone (ANDRODERM) 4 MG/24HR PT24 patch Place 1 patch onto the skin daily.    . traZODone (DESYREL) 150 MG tablet Take by mouth at bedtime.     No current facility-administered medications for this visit.     OBJECTIVE: Vitals:   03/04/16 0954  BP: 127/68  Pulse: 65  Resp: 18  Temp: (!) 96.9 F (36.1 C)     Body mass index is 33.86 kg/m.    ECOG FS:0 - Asymptomatic  General: Well-developed, well-nourished, no acute distress. Eyes: Pink conjunctiva, anicteric sclera. HEENT: Normocephalic, moist mucous membranes, clear oropharnyx. Well-healed surgical scar  on right neck. Lungs: Clear to auscultation bilaterally. Heart: Regular rate and rhythm. No rubs, murmurs, or gallops. Abdomen: Soft, nontender, nondistended. No organomegaly noted, normoactive bowel sounds. Musculoskeletal: No edema, cyanosis, or clubbing. Neuro: Alert, answering all questions appropriately. Cranial nerves grossly intact. Skin: No rashes or petechiae noted. Psych: Normal affect.   LAB RESULTS:  Lab Results    Component Value Date   NA 132 (L) 03/04/2016   K 4.1 03/04/2016   CL 100 (L) 03/04/2016   CO2 26 03/04/2016   GLUCOSE 116 (H) 03/04/2016   BUN 13 03/04/2016   CREATININE 0.99 03/04/2016   CALCIUM 9.0 03/04/2016   PROT 7.4 03/04/2016   ALBUMIN 4.3 03/04/2016   AST 28 03/04/2016   ALT 25 03/04/2016   ALKPHOS 38 03/04/2016   BILITOT 0.8 03/04/2016   GFRNONAA >60 03/04/2016   GFRAA >60 03/04/2016    Lab Results  Component Value Date   WBC 3.4 (L) 03/04/2016   NEUTROABS 2.3 03/04/2016   HGB 14.0 03/04/2016   HCT 39.9 (L) 03/04/2016   MCV 88.3 03/04/2016   PLT 171 03/04/2016     STUDIES: Nm Pet Image Restag (ps) Skull Base To Thigh  Result Date: 03/02/2016 CLINICAL DATA:  Subsequent treatment strategy for neuroendocrine carcinoma. Recently completed chemotherapy. Restaging. EXAM: NUCLEAR MEDICINE PET SKULL BASE TO THIGH TECHNIQUE: 12.1 mCi F-18 FDG was injected intravenously. Full-ring PET imaging was performed from the skull base to thigh after the radiotracer. CT data was obtained and used for attenuation correction and anatomic localization. FASTING BLOOD GLUCOSE:  Value: 112 mg/dl COMPARISON:  11/28/2015 FINDINGS: NECK Postop changes again seen involving the right parotid gland. No evidence of hypermetabolic mass in the surgical bed. No hypermetabolic lymph nodes in the neck. CHEST No hypermetabolic mediastinal or hilar nodes. No suspicious pulmonary nodules on the CT scan. Coronary artery calcification.  Aortic atherosclerosis. ABDOMEN/PELVIS No abnormal hypermetabolic activity within the liver, pancreas, adrenal glands, or spleen. 1.9 cm porta hepatis lymph node is stable in size on image 144/3. This has an SUV max of 17.8 compared to 14.3 previously. No other sites of hypermetabolic lymphadenopathy identified within the abdomen or pelvis. Diffuse hepatic steatosis again noted. Calcified gallstones again seen without evidence of cholecystitis. Aortic atherosclerosis. Sigmoid  diverticulosis again demonstrated, without evidence diverticulitis. Penile prosthesis reservoir again seen in the right suprapubic region. Stable 3.4 cm cystic lesion along the right pelvic sidewall which shows no metabolic activity, consistent with benign etiology such as a lymphocele. SKELETON No focal hypermetabolic activity to suggest skeletal metastasis. IMPRESSION: No significant change in hypermetabolic abdominal porta hepatis lymph node compared with prior study. No new or progressive metastatic disease identified. Stable incidental findings, as described above. Electronically Signed   By: Earle Gell M.D.   On: 03/02/2016 14:36    ASSESSMENT: Stage IV neuroendocrine tumor, consistent with small cell carcinoma with parotid metastasis.  PLAN:    1. Stage IV neuroendocrine tumor, consistent with small cell carcinoma with parotid metastasis: Pathology and imaging results reviewed independently. PET scan results reviewed independently and reported as above with persistent porta hepatis disease. Previously, EUS biopsy of lymph node in the porta hepatis revealed a low-grade neuroendocrine tumor slightly different than the high-grade/small cell carcinoma noted in his parotid gland. This was thought to be the primary lesion and the parotid lesion was metastasis. MRI the brain reviewed independently without metastatic disease. Patient completed 3 cycles of carboplatinum and etoposide on October 10, 2015.  Will continue simple observation and repeat PET  scan in 3 months with follow-up 1-2 days later.    2. Constipation: Resolved. Continue current treatment as needed. 3. Leukopenia: Mild, monitor.   Patient expressed understanding and was in agreement with this plan. He also understands that He can call clinic at any time with any questions, concerns, or complaints.    Lloyd Huger, MD   03/07/2016 8:53 AM

## 2016-03-04 ENCOUNTER — Inpatient Hospital Stay: Payer: Medicare Other | Attending: Oncology

## 2016-03-04 ENCOUNTER — Inpatient Hospital Stay (HOSPITAL_BASED_OUTPATIENT_CLINIC_OR_DEPARTMENT_OTHER): Payer: Medicare Other | Admitting: Oncology

## 2016-03-04 VITALS — BP 127/68 | HR 65 | Temp 96.9°F | Resp 18 | Wt 229.3 lb

## 2016-03-04 DIAGNOSIS — C7A1 Malignant poorly differentiated neuroendocrine tumors: Secondary | ICD-10-CM | POA: Diagnosis not present

## 2016-03-04 DIAGNOSIS — G4730 Sleep apnea, unspecified: Secondary | ICD-10-CM

## 2016-03-04 DIAGNOSIS — Z8546 Personal history of malignant neoplasm of prostate: Secondary | ICD-10-CM | POA: Diagnosis not present

## 2016-03-04 DIAGNOSIS — I700 Atherosclerosis of aorta: Secondary | ICD-10-CM | POA: Diagnosis not present

## 2016-03-04 DIAGNOSIS — F329 Major depressive disorder, single episode, unspecified: Secondary | ICD-10-CM | POA: Insufficient documentation

## 2016-03-04 DIAGNOSIS — Z85828 Personal history of other malignant neoplasm of skin: Secondary | ICD-10-CM | POA: Insufficient documentation

## 2016-03-04 DIAGNOSIS — Z79899 Other long term (current) drug therapy: Secondary | ICD-10-CM

## 2016-03-04 DIAGNOSIS — K219 Gastro-esophageal reflux disease without esophagitis: Secondary | ICD-10-CM | POA: Insufficient documentation

## 2016-03-04 DIAGNOSIS — K760 Fatty (change of) liver, not elsewhere classified: Secondary | ICD-10-CM | POA: Insufficient documentation

## 2016-03-04 DIAGNOSIS — K5730 Diverticulosis of large intestine without perforation or abscess without bleeding: Secondary | ICD-10-CM | POA: Insufficient documentation

## 2016-03-04 DIAGNOSIS — C7989 Secondary malignant neoplasm of other specified sites: Secondary | ICD-10-CM | POA: Insufficient documentation

## 2016-03-04 DIAGNOSIS — I10 Essential (primary) hypertension: Secondary | ICD-10-CM | POA: Diagnosis not present

## 2016-03-04 DIAGNOSIS — D72819 Decreased white blood cell count, unspecified: Secondary | ICD-10-CM | POA: Insufficient documentation

## 2016-03-04 LAB — COMPREHENSIVE METABOLIC PANEL
ALT: 25 U/L (ref 17–63)
ANION GAP: 6 (ref 5–15)
AST: 28 U/L (ref 15–41)
Albumin: 4.3 g/dL (ref 3.5–5.0)
Alkaline Phosphatase: 38 U/L (ref 38–126)
BUN: 13 mg/dL (ref 6–20)
CHLORIDE: 100 mmol/L — AB (ref 101–111)
CO2: 26 mmol/L (ref 22–32)
Calcium: 9 mg/dL (ref 8.9–10.3)
Creatinine, Ser: 0.99 mg/dL (ref 0.61–1.24)
GFR calc Af Amer: >60 mL/min (ref >60)
Glucose, Bld: 116 mg/dL — ABNORMAL HIGH (ref 65–99)
POTASSIUM: 4.1 mmol/L (ref 3.5–5.1)
Sodium: 132 mmol/L — ABNORMAL LOW (ref 135–145)
TOTAL PROTEIN: 7.4 g/dL (ref 6.5–8.1)
Total Bilirubin: 0.8 mg/dL (ref 0.3–1.2)

## 2016-03-04 LAB — CBC WITH DIFFERENTIAL/PLATELET
BASOS ABS: 0 10*3/uL (ref 0–0.1)
BASOS PCT: 1 %
EOS PCT: 2 %
Eosinophils Absolute: 0.1 10*3/uL (ref 0–0.7)
HCT: 39.9 % — ABNORMAL LOW (ref 40.0–52.0)
Hemoglobin: 14 g/dL (ref 13.0–18.0)
LYMPHS PCT: 18 %
Lymphs Abs: 0.6 10*3/uL — ABNORMAL LOW (ref 1.0–3.6)
MCH: 31 pg (ref 26.0–34.0)
MCHC: 35.1 g/dL (ref 32.0–36.0)
MCV: 88.3 fL (ref 80.0–100.0)
MONO ABS: 0.4 10*3/uL (ref 0.2–1.0)
MONOS PCT: 13 %
Neutro Abs: 2.3 10*3/uL (ref 1.4–6.5)
Neutrophils Relative %: 66 %
PLATELETS: 171 10*3/uL (ref 150–440)
RBC: 4.52 MIL/uL (ref 4.40–5.90)
RDW: 13.5 % (ref 11.5–14.5)
WBC: 3.4 10*3/uL — ABNORMAL LOW (ref 3.8–10.6)

## 2016-03-04 NOTE — Progress Notes (Signed)
Offers no complaints  

## 2016-05-19 ENCOUNTER — Encounter: Payer: Self-pay | Admitting: Radiation Oncology

## 2016-05-19 ENCOUNTER — Ambulatory Visit
Admission: RE | Admit: 2016-05-19 | Discharge: 2016-05-19 | Disposition: A | Discharge status: Home / Self Care | Payer: Medicare Other | Source: Ambulatory Visit | Attending: Radiation Oncology | Admitting: Radiation Oncology

## 2016-05-19 VITALS — BP 127/64 | HR 65 | Temp 96.7°F | Wt 230.6 lb

## 2016-05-19 DIAGNOSIS — C7A1 Malignant poorly differentiated neuroendocrine tumors: Secondary | ICD-10-CM | POA: Diagnosis not present

## 2016-05-19 DIAGNOSIS — Z8589 Personal history of malignant neoplasm of other organs and systems: Secondary | ICD-10-CM | POA: Insufficient documentation

## 2016-05-19 DIAGNOSIS — Z923 Personal history of irradiation: Secondary | ICD-10-CM | POA: Diagnosis not present

## 2016-05-19 NOTE — Progress Notes (Signed)
Radiation Oncology Follow up Note  Name: Alex Underwood   Date:   05/19/2016 MRN:  [id number] DOB: 08-18-1945    This 71 y.o. 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  REFERRING PROVIDER: Kirk Ruths, MD  HPI: Patient is a 71 year old male now out 6 months having completed combined modality treatment for a right neck and parotid gland small cell undifferentiated carcinoma. He is seen today in routine follow-up is doing well. He's had some hearing issues has been seen by ENT for that and his hearing is improving. He specifically denies a persistent taste changes dry mouth head and neck pain or dysphagia. He had a recent PET CT scan. At the end of December showing no evidence of disease in the head and neck region. He has a hypermetabolic mass in the porta hepatis which is known neuroendocrine tumor which is stable.  COMPLICATIONS OF TREATMENT: none  FOLLOW UP COMPLIANCE: keeps appointments   PHYSICAL EXAM:  BP 127/64   Pulse 65   Temp (!) 96.7 F (35.9 C)   Wt 230 lb 9.6 oz (104.6 kg)   BMI 34.05 kg/m  Oral cavity is clear no oral mucosal lesions are identified. Indirect mirror examination is difficult although upper airways clear base of tongue is in normal limits. Neck is clear without evidence of preauricular sub-digastric cervical or supraclavicular adenopathy. Well-developed well-nourished patient in NAD. HEENT reveals PERLA, EOMI, discs not visualized.  Oral cavity is clear. No oral mucosal lesions are identified. Neck is clear without evidence of cervical or supraclavicular adenopathy. Lungs are clear to A&P. Cardiac examination is essentially unremarkable with regular rate and rhythm without murmur rub or thrill. Abdomen is benign with no organomegaly or masses noted. Motor sensory and DTR levels are equal and symmetric in the upper and lower  extremities. Cranial nerves II through XII are grossly intact. Proprioception is intact. No peripheral adenopathy or edema is identified. No motor or sensory levels are noted. Crude visual fields are within normal range.  RADIOLOGY RESULTS: PET CT scan is reviewed and compatible with the above-stated findings  PLAN: Present time patient is doing well with no evidence of disease except for known low level neuroendocrine tumor the porta hepatis. We will continue to observe that lesion. I've asked to see the patient back in 6 months for follow-up. Patient knows to call sooner with any concerns.  I would like to take this opportunity to thank you for allowing me to participate in the care of your patient.Alex Underwood., MD

## 2016-07-05 ENCOUNTER — Ambulatory Visit
Admission: RE | Admit: 2016-07-05 | Discharge: 2016-07-05 | Disposition: A | Discharge status: Home / Self Care | Payer: Medicare Other | Source: Ambulatory Visit | Attending: Oncology | Admitting: Oncology

## 2016-07-05 DIAGNOSIS — C7A1 Malignant poorly differentiated neuroendocrine tumors: Secondary | ICD-10-CM | POA: Insufficient documentation

## 2016-07-05 LAB — GLUCOSE, CAPILLARY: Glucose-Capillary: 112 mg/dL — ABNORMAL HIGH (ref 65–99)

## 2016-07-05 MED ORDER — FLUDEOXYGLUCOSE F - 18 (FDG) INJECTION
12.0000 | Freq: Once | INTRAVENOUS | Status: AC | PRN
  Administered 2016-07-05: 12.163 via INTRAVENOUS

## 2016-07-06 NOTE — Progress Notes (Signed)
[name]  [phone] [phone]) [phone]  ID: Alex Underwood OB: [birth year]  MR#: [id number]  JTT#:[id number]  Patient Care Team: Kirk Ruths, MD as PCP - General (Internal Medicine)  CHIEF COMPLAINT: Stage IV neuroendocrine tumor, consistent with small cell carcinoma with parotid metastasis.  INTERVAL HISTORY: Patient returns to clinic today for further evaluation and discussion of his PET scan results. Patient continues to feel well and is asymptomatic.  He has no neurologic complaints. He denies any recent fevers or illnesses. He has a good appetite and denies weight loss. He denies any pain. He denies any chest pain or shortness of breath. He denies any nausea, vomiting, or diarrhea. He has no urinary complaints. Patient offers no specific complaints today.  REVIEW OF SYSTEMS:   Review of Systems  Constitutional: Negative.  Negative for fever, malaise/fatigue and weight loss.  HENT: Negative.  Negative for sore throat.   Respiratory: Negative.  Negative for hemoptysis and shortness of breath.   Cardiovascular: Negative.  Negative for chest pain.  Gastrointestinal: Negative for abdominal pain, blood in stool, constipation, diarrhea, melena, nausea and vomiting.  Genitourinary: Negative.   Musculoskeletal: Negative.   Neurological: Negative.  Negative for weakness and headaches.  Psychiatric/Behavioral: Negative.  The patient is not nervous/anxious.     As per HPI. Otherwise, a complete review of systems is negative.  PAST MEDICAL HISTORY: Past Medical History:  Diagnosis Date  . Cancer (HCC)    PROSTATE,BASAL CELL-FOREHEAD, RIGHT EYE-SQUAMOUS CELL RIGHT EAR  . Depression    Since 04/12  . GERD (gastroesophageal reflux disease)    H/O  . Hypertension   . Sleep apnea    USES CPAP    PAST SURGICAL HISTORY: Past Surgical History:  Procedure Laterality Date  . COLONOSCOPY    . EYE SURGERY Bilateral    CATARACTS  . PAROTIDECTOMY  Right 07/21/2015   Procedure: PAROTIDECTOMY;  Surgeon: Margaretha Sheffield, MD;  Location: [facility] ORS;  Service: ENT;  Laterality: Right;  . PENILE PROSTHESIS IMPLANT    . PROSTATECTOMY    . TOE SURGERY      FAMILY HISTORY: Reviewed and unchanged. No reported history of malignancy or chronic disease.     ADVANCED DIRECTIVES:    HEALTH MAINTENANCE: Social History  Substance Use Topics  . Smoking status: Never Smoker  . Smokeless tobacco: Not on file  . Alcohol use No     Colonoscopy:  PAP:  Bone density:  Lipid panel:  Allergies  Allergen Reactions  . Codeine Other (See Comments)    Hallucinations.  Freida Underwood [Terbinafine Hcl] Rash  . Penicillins Rash    Current Outpatient Prescriptions  Medication Sig Dispense Refill  . amLODipine (NORVASC) 5 MG tablet Take 5 mg by mouth every morning.     Marland Kitchen aspirin 81 MG chewable tablet Chew 81 mg by mouth daily.    Marland Kitchen desvenlafaxine (PRISTIQ) 50 MG 24 hr tablet Take 50 mg by mouth every morning.     . testosterone (ANDRODERM) 4 MG/24HR PT24 patch Place 1 patch onto the skin daily.    . traZODone (DESYREL) 150 MG tablet Take by mouth at bedtime.     No current facility-administered medications for this visit.     OBJECTIVE: Vitals:   07/07/16 1013  BP: 117/69  Pulse: (!) 55  Temp: 97.2 F (36.2 C)     Body mass index is 34.36 kg/m.    ECOG FS:0 - Asymptomatic  General: Well-developed, well-nourished, no acute distress. Eyes: Pink conjunctiva,  anicteric sclera. HEENT: Normocephalic, moist mucous membranes, clear oropharnyx. Well-healed surgical scar on right neck. Lungs: Clear to auscultation bilaterally. Heart: Regular rate and rhythm. No rubs, murmurs, or gallops. Abdomen: Soft, nontender, nondistended. No organomegaly noted, normoactive bowel sounds. Musculoskeletal: No edema, cyanosis, or clubbing. Neuro: Alert, answering all questions appropriately. Cranial nerves grossly intact. Skin: No rashes or petechiae noted. Psych:  Normal affect.   LAB RESULTS:  Lab Results  Component Value Date   NA 132 (L) 03/04/2016   K 4.1 03/04/2016   CL 100 (L) 03/04/2016   CO2 26 03/04/2016   GLUCOSE 116 (H) 03/04/2016   BUN 13 03/04/2016   CREATININE 0.99 03/04/2016   CALCIUM 9.0 03/04/2016   PROT 7.4 03/04/2016   ALBUMIN 4.3 03/04/2016   AST 28 03/04/2016   ALT 25 03/04/2016   ALKPHOS 38 03/04/2016   BILITOT 0.8 03/04/2016   GFRNONAA >60 03/04/2016   GFRAA >60 03/04/2016    Lab Results  Component Value Date   WBC 3.4 (L) 03/04/2016   NEUTROABS 2.3 03/04/2016   HGB 14.0 03/04/2016   HCT 39.9 (L) 03/04/2016   MCV 88.3 03/04/2016   PLT 171 03/04/2016     STUDIES: Nm Pet Image Restag (ps) Skull Base To Thigh  Result Date: 07/06/2016 CLINICAL DATA:  Subsequent treatment strategy for restaging neuroendocrine carcinoma. Chemotherapy completed 10/10/2015 EXAM: NUCLEAR MEDICINE PET SKULL BASE TO THIGH TECHNIQUE: 12.1 mCi F-18 FDG was injected intravenously. Full-ring PET imaging was performed from the skull base to thigh after the radiotracer. CT data was obtained and used for attenuation correction and anatomic localization. FASTING BLOOD GLUCOSE:  Value: 112 mg/dl COMPARISON:  PET-CT 03/02/2016 FINDINGS: NECK No hypermetabolic lymph nodes in the neck. CHEST No hypermetabolic mediastinal or hilar nodes. No suspicious pulmonary nodules on the CT scan. ABDOMEN/PELVIS Single hypermetabolic periportal lymph node is not changed in size or metabolic activity measuring 18 mm (image 222, series 3) with SUV max equal 17.0 compared to 17.8. No new hypermetabolic lymph nodes in the abdomen or pelvis. No abnormal activity liver. No abnormal activity associated with the bowel. Multiple diverticula of the sigmoid colon.  Gallstones noted. SKELETON No focal hypermetabolic activity to suggest skeletal metastasis. IMPRESSION: 1. Stable exam with extensive hypermetabolic periportal lymph node. 2. No evidence of new metabolic lesions on  FDG PET CT scan. Electronically Signed   By: Suzy Bouchard M.D.   On: 07/06/2016 10:14    ASSESSMENT: Stage IV neuroendocrine tumor, consistent with small cell carcinoma with parotid metastasis.  PLAN:    1. Stage IV neuroendocrine tumor, consistent with small cell carcinoma with parotid metastasis: Pathology and imaging results reviewed independently. PET scan results reviewed independently and reported as above with persistent porta hepatis disease. Previously, EUS biopsy of lymph node in the porta hepatis revealed a low-grade neuroendocrine tumor slightly different than the high-grade/small cell carcinoma noted in his parotid gland. This was thought to be the primary lesion and the parotid lesion was metastasis. MRI the brain reviewed independently without metastatic disease. Patient completed 3 cycles of carboplatinum and etoposide on October 10, 2015.  Will continue simple observation and repeat gallium PET scan in 6 months with follow-up 1-2 days later.    2. Constipation: Resolved. Continue current treatment as needed. 3. Leukopenia: Mild, monitor.  4. Hyponatremia: Mild, monitor.  Patient expressed understanding and was in agreement with this plan. He also understands that He can call clinic at any time with any questions, concerns, or complaints.    Kathlene November  Grayland Ormond, MD   07/10/2016 7:07 AM

## 2016-07-07 ENCOUNTER — Inpatient Hospital Stay: Payer: Medicare Other | Attending: Oncology | Admitting: Oncology

## 2016-07-07 VITALS — BP 117/69 | HR 55 | Temp 97.2°F | Wt 232.7 lb

## 2016-07-07 DIAGNOSIS — C7A8 Other malignant neuroendocrine tumors: Secondary | ICD-10-CM | POA: Diagnosis not present

## 2016-07-07 DIAGNOSIS — D72819 Decreased white blood cell count, unspecified: Secondary | ICD-10-CM

## 2016-07-07 DIAGNOSIS — I10 Essential (primary) hypertension: Secondary | ICD-10-CM | POA: Diagnosis not present

## 2016-07-07 DIAGNOSIS — Z79899 Other long term (current) drug therapy: Secondary | ICD-10-CM | POA: Diagnosis not present

## 2016-07-07 DIAGNOSIS — K8020 Calculus of gallbladder without cholecystitis without obstruction: Secondary | ICD-10-CM | POA: Diagnosis not present

## 2016-07-07 DIAGNOSIS — K219 Gastro-esophageal reflux disease without esophagitis: Secondary | ICD-10-CM | POA: Diagnosis not present

## 2016-07-07 DIAGNOSIS — K5730 Diverticulosis of large intestine without perforation or abscess without bleeding: Secondary | ICD-10-CM | POA: Diagnosis not present

## 2016-07-07 DIAGNOSIS — E871 Hypo-osmolality and hyponatremia: Secondary | ICD-10-CM | POA: Diagnosis not present

## 2016-07-07 DIAGNOSIS — F329 Major depressive disorder, single episode, unspecified: Secondary | ICD-10-CM | POA: Diagnosis not present

## 2016-07-07 DIAGNOSIS — G4730 Sleep apnea, unspecified: Secondary | ICD-10-CM | POA: Insufficient documentation

## 2016-07-07 DIAGNOSIS — Z7982 Long term (current) use of aspirin: Secondary | ICD-10-CM | POA: Insufficient documentation

## 2016-07-07 DIAGNOSIS — C7A1 Malignant poorly differentiated neuroendocrine tumors: Secondary | ICD-10-CM | POA: Diagnosis present

## 2016-12-02 ENCOUNTER — Encounter: Payer: Self-pay | Admitting: Radiation Oncology

## 2016-12-02 ENCOUNTER — Ambulatory Visit
Admission: RE | Admit: 2016-12-02 | Discharge: 2016-12-02 | Disposition: A | Discharge status: Home / Self Care | Payer: Medicare Other | Source: Ambulatory Visit | Attending: Radiation Oncology | Admitting: Radiation Oncology

## 2016-12-02 VITALS — BP 119/65 | HR 70 | Temp 96.2°F | Resp 20 | Wt 233.4 lb

## 2016-12-02 DIAGNOSIS — Z85819 Personal history of malignant neoplasm of unspecified site of lip, oral cavity, and pharynx: Secondary | ICD-10-CM | POA: Insufficient documentation

## 2016-12-02 DIAGNOSIS — Z923 Personal history of irradiation: Secondary | ICD-10-CM | POA: Diagnosis not present

## 2016-12-02 DIAGNOSIS — C7A1 Malignant poorly differentiated neuroendocrine tumors: Secondary | ICD-10-CM

## 2016-12-02 NOTE — Progress Notes (Signed)
Radiation Oncology Follow up Note  Name: Alex Underwood   Date:   12/02/2016 MRN:  [id number] DOB: 03/12/1946    This 71 y.o. male presents to the clinic today for 1 year follow-up status post IM RT to the right neck and parotid gland for small cell undifferentiated carcinoma.  REFERRING PROVIDER: Kirk Ruths, MD  HPI: Patient is a 71 year old male now out 1 year having completed radiation therapy to his right parotid bed as well as right neck with concurrent chemotherapy for small cell undifferentiated carcinoma. He is seen today in routine follow-up and is doing well. He is having some teeth issues need some teeth pulled because of monetary concerns is not having that done. He specifically denies head and neck pain dysphasia. He his taste has returned to normal.. He did have a PET scan which I reviewed back in April 2018 showing persistent periportal hypermetabolic node no evidence of disease in the head and neck region.  COMPLICATIONS OF TREATMENT: none  FOLLOW UP COMPLIANCE: keeps appointments   PHYSICAL EXAM:  BP 119/65   Pulse 70   Temp (!) 96.2 F (35.7 C)   Resp 20   Wt 233 lb 5.7 oz (105.8 kg)   BMI 34.46 kg/m  Oral cavity is clear teeth appear to be in good state of repair he has multiple gold fillings. No oral mucosal lesions are identified. Indirect mirror examination shows upper airway clear vallecula and base of tongue within normal limits. Neck is clear without evidence of mass or nodularity in the bilateral parotid area sub-digastric cervical or supraclavicular region. Well-developed well-nourished patient in NAD. HEENT reveals PERLA, EOMI, discs not visualized.  Oral cavity is clear. No oral mucosal lesions are identified. Neck is clear without evidence of cervical or supraclavicular adenopathy. Lungs are clear to A&P. Cardiac examination is essentially unremarkable with regular rate and rhythm without murmur rub or thrill. Abdomen is benign with no organomegaly or  masses noted. Motor sensory and DTR levels are equal and symmetric in the upper and lower extremities. Cranial nerves II through XII are grossly intact. Proprioception is intact. No peripheral adenopathy or edema is identified. No motor or sensory levels are noted. Crude visual fields are within normal range.  RADIOLOGY RESULTS: PET CT scan is reviewed  PLAN: Present time patient is doing well. Only area of concern is the periportal lymph node which she has a repeat PET CT scan scheduled for October which I will review. Otherwise I've suggested he follows his dentist advice and having some teeth extracted which are causing pain and discomfort. I've otherwise asked to see him back in 1 year for follow-up. Patient knows to call sooner with any concerns.  I would like to take this opportunity to thank you for allowing me to participate in the care of your patient.Armstead Peaks., MD

## 2017-01-03 ENCOUNTER — Encounter (HOSPITAL_COMMUNITY)
Admission: RE | Admit: 2017-01-03 | Discharge: 2017-01-03 | Disposition: A | Discharge status: Home / Self Care | Payer: Medicare Other | Source: Ambulatory Visit | Attending: Oncology | Admitting: Oncology

## 2017-01-03 DIAGNOSIS — C7A1 Malignant poorly differentiated neuroendocrine tumors: Secondary | ICD-10-CM

## 2017-01-03 MED ORDER — GALLIUM GA 68 DOTATATE IV KIT
5.3900 | PACK | Freq: Once | INTRAVENOUS | Status: AC
  Administered 2017-01-03: 5.39 via INTRAVENOUS

## 2017-01-04 ENCOUNTER — Encounter (HOSPITAL_COMMUNITY): Payer: Medicare Other

## 2017-01-05 NOTE — Progress Notes (Signed)
[facility]  [phone]) [phone] [phone]) [phone]  ID: ANCEL EASLER OB: December 08, 1945  MR#: [id number]  VOJ#:[id number]  Patient Care Team: Kirk Ruths, MD as PCP - General (Internal Medicine)  CHIEF COMPLAINT: Stage IV neuroendocrine tumor, consistent with small cell carcinoma with parotid metastasis.  INTERVAL HISTORY: Patient returns to clinic today for further evaluation and discussion of his PET scan results. Patient continues to feel well and is asymptomatic.  He has no neurologic complaints. He denies any recent fevers or illnesses. He has a good appetite and denies weight loss. He denies any pain. He denies any chest pain or shortness of breath. He denies any nausea, vomiting, or diarrhea. He has no urinary complaints. Patient offers no specific complaints today.  REVIEW OF SYSTEMS:   Review of Systems  Constitutional: Negative.  Negative for fever, malaise/fatigue and weight loss.  HENT: Negative.  Negative for sore throat.   Respiratory: Negative.  Negative for hemoptysis and shortness of breath.   Cardiovascular: Negative.  Negative for chest pain.  Gastrointestinal: Negative for abdominal pain, blood in stool, constipation, diarrhea, melena, nausea and vomiting.  Genitourinary: Negative.   Musculoskeletal: Negative.   Neurological: Negative.  Negative for weakness and headaches.  Psychiatric/Behavioral: Negative.  The patient is not nervous/anxious.     As per HPI. Otherwise, a complete review of systems is negative.  PAST MEDICAL HISTORY: Past Medical History:  Diagnosis Date  . Cancer (HCC)    PROSTATE,BASAL CELL-FOREHEAD, RIGHT EYE-SQUAMOUS CELL RIGHT EAR  . Depression    Since 04/12  . GERD (gastroesophageal reflux disease)    H/O  . Hypertension   . Sleep apnea    USES CPAP    PAST SURGICAL HISTORY: Past Surgical History:  Procedure Laterality Date  . COLONOSCOPY    . EYE SURGERY Bilateral    CATARACTS  . PAROTIDECTOMY  Right 07/21/2015   Procedure: PAROTIDECTOMY;  Surgeon: Margaretha Sheffield, MD;  Location: [facility] ORS;  Service: ENT;  Laterality: Right;  . PENILE PROSTHESIS IMPLANT    . PROSTATECTOMY    . TOE SURGERY      FAMILY HISTORY: Reviewed and unchanged. No reported history of malignancy or chronic disease.     ADVANCED DIRECTIVES:    HEALTH MAINTENANCE: Social History  Substance Use Topics  . Smoking status: Never Smoker  . Smokeless tobacco: Never Used  . Alcohol use No     Colonoscopy:  PAP:  Bone density:  Lipid panel:  Allergies  Allergen Reactions  . Codeine Other (See Comments)    Hallucinations.  Freida Busman [Terbinafine Hcl] Rash  . Penicillins Rash    Current Outpatient Prescriptions  Medication Sig Dispense Refill  . amLODipine (NORVASC) 5 MG tablet Take 5 mg by mouth every morning.     Marland Kitchen aspirin 81 MG chewable tablet Chew 81 mg by mouth daily.    Marland Kitchen desvenlafaxine (PRISTIQ) 50 MG 24 hr tablet Take 50 mg by mouth every morning.     . testosterone (ANDRODERM) 4 MG/24HR PT24 patch Place 1 patch onto the skin daily.    . traZODone (DESYREL) 150 MG tablet Take by mouth at bedtime.     No current facility-administered medications for this visit.     OBJECTIVE: Vitals:   01/06/17 1349  BP: 118/61  Pulse: 60  Resp: 20  Temp: (!) 96.2 F (35.7 C)     Body mass index is 34.81 kg/m.    ECOG FS:0 - Asymptomatic  General: Well-developed, well-nourished, no acute distress. Eyes:  Pink conjunctiva, anicteric sclera. HEENT: Normocephalic, moist mucous membranes, clear oropharnyx. Well-healed surgical scar on right neck. Lungs: Clear to auscultation bilaterally. Heart: Regular rate and rhythm. No rubs, murmurs, or gallops. Abdomen: Soft, nontender, nondistended. No organomegaly noted, normoactive bowel sounds. Musculoskeletal: No edema, cyanosis, or clubbing. Neuro: Alert, answering all questions appropriately. Cranial nerves grossly intact. Skin: No rashes or petechiae  noted. Psych: Normal affect.   LAB RESULTS:  Lab Results  Component Value Date   NA 132 (L) 03/04/2016   K 4.1 03/04/2016   CL 100 (L) 03/04/2016   CO2 26 03/04/2016   GLUCOSE 116 (H) 03/04/2016   BUN 13 03/04/2016   CREATININE 0.99 03/04/2016   CALCIUM 9.0 03/04/2016   PROT 7.4 03/04/2016   ALBUMIN 4.3 03/04/2016   AST 28 03/04/2016   ALT 25 03/04/2016   ALKPHOS 38 03/04/2016   BILITOT 0.8 03/04/2016   GFRNONAA >60 03/04/2016   GFRAA >60 03/04/2016    Lab Results  Component Value Date   WBC 3.4 (L) 03/04/2016   NEUTROABS 2.3 03/04/2016   HGB 14.0 03/04/2016   HCT 39.9 (L) 03/04/2016   MCV 88.3 03/04/2016   PLT 171 03/04/2016     STUDIES: Nm Pet (netspot Ga 85 Dotatate) Skull Base To Mid Thigh  Result Date: 01/03/2017 CLINICAL DATA:  Initial staging neuroendocrine tumor. EXAM: NUCLEAR MEDICINE PET SKULL BASE TO THIGH TECHNIQUE: 5.4 mCi Ga 63 DOTATATE was injected intravenously. Full-ring PET imaging was performed from the skull base to thigh after the radiotracer. CT data was obtained and used for attenuation correction and anatomic localization. COMPARISON:  Chest FDG PET scan 07/05/2016 FINDINGS: NECK No radiotracer activity in neck lymph nodes. CHEST No radiotracer accumulation within mediastinal or hilar lymph nodes. No suspicious pulmonary nodules on the CT scan. ABDOMEN/PELVIS Intense radiotracer activity localizing to the rounded periportal lesion measuring 16 mm on image 114, series 4 with SUV max equal 174.9. This lesion is not changed in size from 17 mm on comparison CT of 07/05/2016. Lesion was hypermetabolic on that scan. No focal activity within the liver. Low-attenuation liver suggests hepatic steatosis. Gallstones noted. There is no additional abnormal radiotracer within the abdomen pelvis. Knapp abnormal activity in the bowel. No additional nodes. Diverticulosis of the sigmoid colon noted.  Penile prosthetic noted. Physiologic activity noted in the liver,  spleen, adrenal glands and kidneys. SKELETON No evidence skeletal metastatic disease. IMPRESSION: 1. Intense radiotracer activity associated with the round lesion in in the porta hepatis is consistent with a well differentiated neuroendocrine tumor. 2. No evidence of metastatic disease in liver. 3. No focal activity in the bowel. 4. No evidence of distant disease. Electronically Signed   By: Suzy Bouchard M.D.   On: 01/03/2017 12:59    ASSESSMENT: Stage IV neuroendocrine tumor, consistent with small cell carcinoma with parotid metastasis.  PLAN:    1. Stage IV neuroendocrine tumor, consistent with small cell carcinoma with parotid metastasis: Pathology and imaging results reviewed independently. PET scan results reviewed independently and reported as above with persistent, but unchanged porta hepatis disease. Previously, EUS biopsy of lymph node in the porta hepatis revealed a low-grade neuroendocrine tumor slightly different than the high-grade/small cell carcinoma noted in his parotid gland. This was thought to be the primary lesion and the parotid lesion was metastasis. MRI the brain reviewed independently without metastatic disease. Patient completed 3 cycles of carboplatinum and etoposide on October 10, 2015.  Will continue simple observation and repeat gallium PET scan in 6 months with  follow-up 1-2 days later.    2. Constipation: Resolved. Continue current treatment as needed. 3. Leukopenia: Mild, monitor.  4. Hyponatremia: Mild, monitor.  Patient expressed understanding and was in agreement with this plan. He also understands that He can call clinic at any time with any questions, concerns, or complaints.    Lloyd Huger, MD   01/07/2017 10:25 AM

## 2017-01-06 ENCOUNTER — Telehealth: Payer: Self-pay | Admitting: Oncology

## 2017-01-06 ENCOUNTER — Inpatient Hospital Stay: Payer: Medicare Other | Attending: Oncology | Admitting: Oncology

## 2017-01-06 VITALS — BP 118/61 | HR 60 | Temp 96.2°F | Resp 20 | Wt 235.7 lb

## 2017-01-06 DIAGNOSIS — D72819 Decreased white blood cell count, unspecified: Secondary | ICD-10-CM | POA: Insufficient documentation

## 2017-01-06 DIAGNOSIS — K219 Gastro-esophageal reflux disease without esophagitis: Secondary | ICD-10-CM | POA: Insufficient documentation

## 2017-01-06 DIAGNOSIS — Z7982 Long term (current) use of aspirin: Secondary | ICD-10-CM | POA: Insufficient documentation

## 2017-01-06 DIAGNOSIS — F329 Major depressive disorder, single episode, unspecified: Secondary | ICD-10-CM | POA: Diagnosis not present

## 2017-01-06 DIAGNOSIS — C7A1 Malignant poorly differentiated neuroendocrine tumors: Secondary | ICD-10-CM | POA: Insufficient documentation

## 2017-01-06 DIAGNOSIS — K5730 Diverticulosis of large intestine without perforation or abscess without bleeding: Secondary | ICD-10-CM | POA: Insufficient documentation

## 2017-01-06 DIAGNOSIS — C7B8 Other secondary neuroendocrine tumors: Secondary | ICD-10-CM | POA: Diagnosis not present

## 2017-01-06 DIAGNOSIS — E871 Hypo-osmolality and hyponatremia: Secondary | ICD-10-CM | POA: Insufficient documentation

## 2017-01-06 DIAGNOSIS — K8020 Calculus of gallbladder without cholecystitis without obstruction: Secondary | ICD-10-CM | POA: Insufficient documentation

## 2017-01-06 DIAGNOSIS — Z79899 Other long term (current) drug therapy: Secondary | ICD-10-CM | POA: Insufficient documentation

## 2017-01-06 DIAGNOSIS — I10 Essential (primary) hypertension: Secondary | ICD-10-CM | POA: Insufficient documentation

## 2017-01-06 DIAGNOSIS — G4730 Sleep apnea, unspecified: Secondary | ICD-10-CM | POA: Insufficient documentation

## 2017-01-06 NOTE — Progress Notes (Signed)
Patient denies any concerns today.  

## 2017-01-06 NOTE — Telephone Encounter (Signed)
Created in error

## 2017-01-31 DIAGNOSIS — C7A8 Other malignant neuroendocrine tumors: Secondary | ICD-10-CM | POA: Insufficient documentation

## 2017-07-07 ENCOUNTER — Ambulatory Visit (HOSPITAL_COMMUNITY)
Admission: RE | Admit: 2017-07-07 | Discharge: 2017-07-07 | Disposition: A | Discharge status: Home / Self Care | Payer: Medicare Other | Source: Ambulatory Visit | Attending: Oncology | Admitting: Oncology

## 2017-07-07 DIAGNOSIS — K760 Fatty (change of) liver, not elsewhere classified: Secondary | ICD-10-CM | POA: Insufficient documentation

## 2017-07-07 DIAGNOSIS — I2584 Coronary atherosclerosis due to calcified coronary lesion: Secondary | ICD-10-CM | POA: Diagnosis not present

## 2017-07-07 DIAGNOSIS — C7A1 Malignant poorly differentiated neuroendocrine tumors: Secondary | ICD-10-CM

## 2017-07-07 DIAGNOSIS — I700 Atherosclerosis of aorta: Secondary | ICD-10-CM | POA: Diagnosis not present

## 2017-07-07 DIAGNOSIS — I2510 Atherosclerotic heart disease of native coronary artery without angina pectoris: Secondary | ICD-10-CM | POA: Diagnosis not present

## 2017-07-07 DIAGNOSIS — K8020 Calculus of gallbladder without cholecystitis without obstruction: Secondary | ICD-10-CM | POA: Diagnosis not present

## 2017-07-07 MED ORDER — GALLIUM GA 68 DOTATATE IV KIT
5.6000 | PACK | Freq: Once | INTRAVENOUS | Status: AC
  Administered 2017-07-07: 5.6 via INTRAVENOUS

## 2017-07-10 NOTE — Progress Notes (Signed)
[place]  Telephone:(336) [phone] Fax:(336) [phone]  ID: Alex Underwood OB: 08/19/1945  MR#: [id number]  [id number]#:[id number]  Patient Care Team: Kirk Ruths, MD as PCP - General (Internal Medicine)  CHIEF COMPLAINT: Stage IV neuroendocrine tumor, consistent with small cell carcinoma with parotid metastasis.  INTERVAL HISTORY: Patient returns to clinic today for further evaluation and discussion of his imaging results.  He continues to feel well and remains asymptomatic.  He has no neurologic complaints. He denies any recent fevers or illnesses. He has a good appetite and denies weight loss. He denies any chest pain or shortness of breath.  He denies any abdominal pain.  He has no nausea, vomiting, constipation, or diarrhea.  He has no urinary complaints.  Patient feels at his baseline offers no specific complaints today.   REVIEW OF SYSTEMS:   Review of Systems  Constitutional: Negative.  Negative for fever, malaise/fatigue and weight loss.  HENT: Negative.  Negative for sore throat.   Respiratory: Negative.  Negative for hemoptysis and shortness of breath.   Cardiovascular: Negative.  Negative for chest pain and leg swelling.  Gastrointestinal: Negative.  Negative for abdominal pain, blood in stool, constipation, diarrhea, melena, nausea and vomiting.  Genitourinary: Negative.   Musculoskeletal: Negative.   Skin: Negative.  Negative for rash.  Neurological: Negative for sensory change, focal weakness, weakness and headaches.  Psychiatric/Behavioral: Negative.  The patient is not nervous/anxious.     As per HPI. Otherwise, a complete review of systems is negative.  PAST MEDICAL HISTORY: Past Medical History:  Diagnosis Date  . Cancer (HCC)    PROSTATE,BASAL CELL-FOREHEAD, RIGHT EYE-SQUAMOUS CELL RIGHT EAR  . Depression    Since 04/12  . GERD (gastroesophageal reflux disease)    H/O  . Hypertension   . Sleep apnea    USES CPAP    PAST SURGICAL  HISTORY: Past Surgical History:  Procedure Laterality Date  . COLONOSCOPY    . EYE SURGERY Bilateral    CATARACTS  . PAROTIDECTOMY Right 07/21/2015   Procedure: PAROTIDECTOMY;  Surgeon: Margaretha Sheffield, MD;  Location: [facility] ORS;  Service: ENT;  Laterality: Right;  . PENILE PROSTHESIS IMPLANT    . PROSTATECTOMY    . TOE SURGERY      FAMILY HISTORY: Reviewed and unchanged. No reported history of malignancy or chronic disease.     ADVANCED DIRECTIVES:    HEALTH MAINTENANCE: Social History   Tobacco Use  . Smoking status: Never Smoker  . Smokeless tobacco: Never Used  Substance Use Topics  . Alcohol use: No    Alcohol/week: 14.4 oz    Types: 24 Cans of beer per week  . Drug use: No     Colonoscopy:  PAP:  Bone density:  Lipid panel:  Allergies  Allergen Reactions  . Codeine Other (See Comments)    Hallucinations.  . Lamisil [Terbinafine] Rash  . Penicillins Rash  . Terbinafine Rash    Current Outpatient Medications  Medication Sig Dispense Refill  . amLODipine (NORVASC) 5 MG tablet Take 5 mg by mouth every morning.     Marland Kitchen aspirin 81 MG chewable tablet Chew 81 mg by mouth daily.     Marland Kitchen desvenlafaxine (PRISTIQ) 50 MG 24 hr tablet Take 50 mg by mouth every morning.     . testosterone (ANDRODERM) 4 MG/24HR PT24 patch Place 1 patch onto the skin daily.    . traZODone (DESYREL) 150 MG tablet Take by mouth at bedtime.     No current facility-administered medications  for this visit.     OBJECTIVE: Vitals:   07/12/17 1405  BP: 118/65  Pulse: (!) 57  Resp: 18  Temp: (!) 96.5 F (35.8 C)     Body mass index is 35.93 kg/m.    ECOG FS:0 - Asymptomatic  General: Well-developed, well-nourished, no acute distress. Eyes: Pink conjunctiva, anicteric sclera. HEENT: Normocephalic, moist mucous membranes, clear oropharnyx.  Well-healed surgical scar on right neck. Lungs: Clear to auscultation bilaterally. Heart: Regular rate and rhythm. No rubs, murmurs, or gallops. Abdomen:  Soft, nontender, nondistended. No organomegaly noted, normoactive bowel sounds. Musculoskeletal: No edema, cyanosis, or clubbing. Neuro: Alert, answering all questions appropriately. Cranial nerves grossly intact. Skin: No rashes or petechiae noted. Psych: Normal affect. Lymphatics: No cervical, calvicular, axillary or inguinal LAD.   LAB RESULTS:  Lab Results  Component Value Date   NA 132 (L) 03/04/2016   K 4.1 03/04/2016   CL 100 (L) 03/04/2016   CO2 26 03/04/2016   GLUCOSE 116 (H) 03/04/2016   BUN 13 03/04/2016   CREATININE 0.99 03/04/2016   CALCIUM 9.0 03/04/2016   PROT 7.4 03/04/2016   ALBUMIN 4.3 03/04/2016   AST 28 03/04/2016   ALT 25 03/04/2016   ALKPHOS 38 03/04/2016   BILITOT 0.8 03/04/2016   GFRNONAA >60 03/04/2016   GFRAA >60 03/04/2016    Lab Results  Component Value Date   WBC 3.4 (L) 03/04/2016   NEUTROABS 2.3 03/04/2016   HGB 14.0 03/04/2016   HCT 39.9 (L) 03/04/2016   MCV 88.3 03/04/2016   PLT 171 03/04/2016     STUDIES: Nm Pet (netspot Ga 87 Dotatate) Skull Base To Mid Thigh  Result Date: 07/07/2017 CLINICAL DATA:  Subsequent treatment strategy for poorly differentiated neuroendocrine tumor. EXAM: NUCLEAR MEDICINE PET SKULL BASE TO THIGH TECHNIQUE: 5.6 mCi Ga 41 DOTATATE was injected intravenously. Full-ring PET imaging was performed from the skull base to thigh after the radiotracer. CT data was obtained and used for attenuation correction and anatomic localization. COMPARISON:  01/03/2017 FINDINGS: NECK No radiotracer activity in neck lymph nodes. Incidental CT findings: None CHEST No radiotracer accumulation within mediastinal or hilar lymph nodes. No suspicious pulmonary nodules on the CT scan. Incidental CT finding:Aortic atherosclerosis. Calcification in the left main coronary artery, LAD and left circumflex noted. Calcified granulomas identified in the left lower lobe ABDOMEN/PELVIS Periportal lesion measures 2 cm and has an SUV max equal to 127.  On the previous exam this measured 2 cm and had a SUV max of 175. Physiologic activity noted in the liver, spleen, adrenal glands and kidneys. Incidental CT findings:Gallstones identified. Diffuse hepatic steatosis. Aortic atherosclerosis. Penile prosthesis noted. SKELETON No focal activity to suggest skeletal metastasis. Incidental CT findings:None IMPRESSION: 1. Stable appearance of rounded lesion within the porta hepatis which exhibits intense radiotracer activity consistent with well-differentiated neuroendocrine tumor. 2. No additional foci of abnormal radiotracer uptake to suggest additional sites of disease. 3. Hepatic steatosis. 4. Aortic Atherosclerosis (ICD10-I70.0). Three vessel coronary artery atherosclerotic calcifications. 5. Gallstones. Electronically Signed   By: Kerby Moors M.D.   On: 07/07/2017 09:32    ASSESSMENT: Stage IV neuroendocrine tumor, consistent with small cell carcinoma with parotid metastasis.  PLAN:    1. Stage IV neuroendocrine tumor, consistent with small cell carcinoma with parotid metastasis: Patient completed 3 cycles of carboplatinum and etoposide on October 10, 2015.  PET scan results from July 07, 2017 reviewed independently and reported as above with persistent, but unchanged, disease in his porta hepatis measuring approximately 2 cm.  Previously, EUS biopsy of this lesion revealed a low-grade neuroendocrine tumor slightly different than the high-grade/small cell carcinoma noted in his parotid gland. This was thought to be the primary lesion and the parotid lesion was metastasis.  Previously, MRI the brain reviewed independently without metastatic disease.  Will continue simple observation.  Return to clinic in 6 months with CT scan and follow-up 1 to 2 days later.    Approximately 30 minutes was spent in discussion of which greater than 50% was consultation.     Patient expressed understanding and was in agreement with this plan. He also understands that He can  call clinic at any time with any questions, concerns, or complaints.    Lloyd Huger, MD   07/13/2017 10:50 PM

## 2017-07-12 ENCOUNTER — Other Ambulatory Visit: Payer: Self-pay

## 2017-07-12 ENCOUNTER — Inpatient Hospital Stay: Payer: Medicare Other | Attending: Oncology | Admitting: Oncology

## 2017-07-12 VITALS — BP 118/65 | HR 57 | Temp 96.5°F | Resp 18 | Wt 243.3 lb

## 2017-07-12 DIAGNOSIS — C7B8 Other secondary neuroendocrine tumors: Secondary | ICD-10-CM | POA: Insufficient documentation

## 2017-07-12 DIAGNOSIS — C7A1 Malignant poorly differentiated neuroendocrine tumors: Secondary | ICD-10-CM

## 2017-07-12 NOTE — Progress Notes (Signed)
Here for follow up.  Per pt "doing great " no c/o

## 2017-08-06 IMAGING — PT NM PET TUM IMG RESTAG (PS) SKULL BASE T - THIGH
9 series · 25 of 25 positions shown · non-contrast
Comparison: PET-CT 03/02/2016

CLINICAL DATA: Subsequent treatment strategy for restaging
neuroendocrine carcinoma.. Chemotherapy completed 10/10/2015

EXAM:
NUCLEAR MEDICINE PET SKULL BASE TO THIGH
TECHNIQUE: 12.1 mCi F-18 FDG was injected intravenously. Full-ring PET imaging
was performed from the skull base to thigh after the radiotracer. CT
data was obtained and used for attenuation correction and anatomic
localization.
FASTING BLOOD GLUCOSE:  Value: 112 mg/dl

[Series 3: ct wb 3.0 b30f · axial · 3.0mm · 0.98mm/px · z∈[-1460,-474]mm · 5 of 494 slices shown]
[im 1/494]
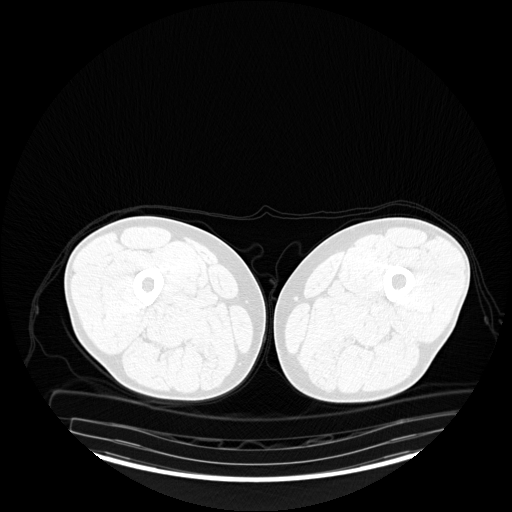
[im 124/494]
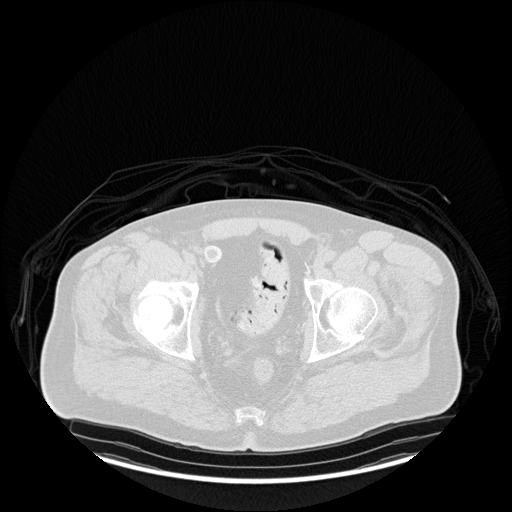
[im 247/494]
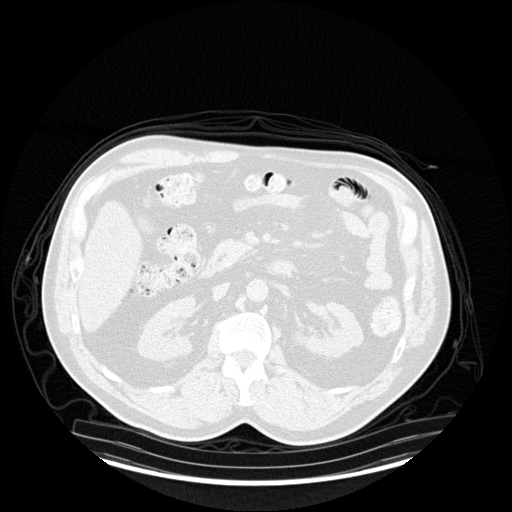
[im 370/494]
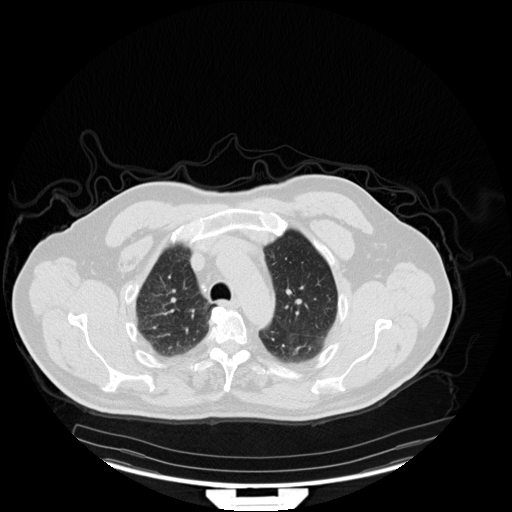
[im 494/494  brain]
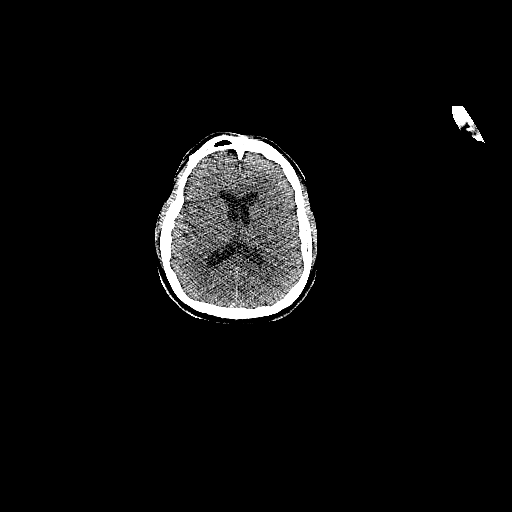

[Series 4: pet wb (ac) · axial · 5.0mm · 3.13mm/px · z∈[-1458,-474]mm · 4 of 329 slices shown]
[im 1/329]
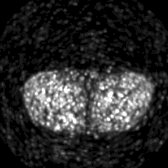
[im 110/329]
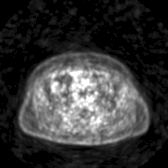
[im 219/329]
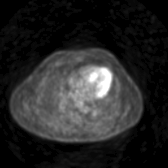
[im 329/329]
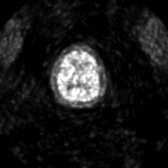

[Series 5: pet wb uncorrected (nac) · axial · 5.0mm · 4.07mm/px · z∈[-1458,-474]mm · 4 of 329 slices shown]
[im 1/329]
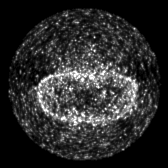
[im 110/329]
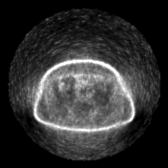
[im 219/329]
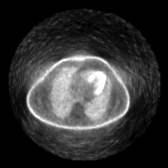
[im 329/329]
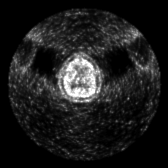

[Series 603: pet_ct axial fused · 4 of 321 slices shown]
[im 1/321]
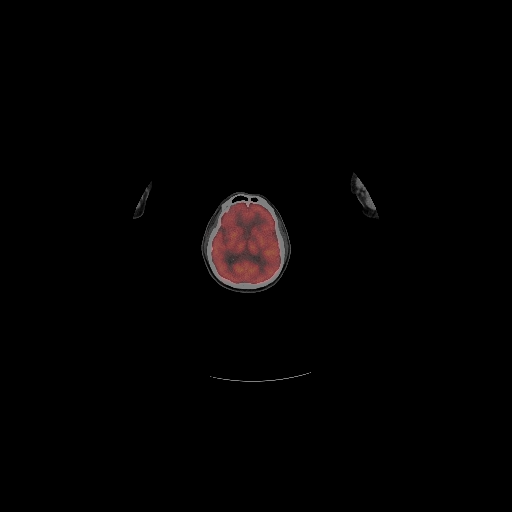
[im 107/321]
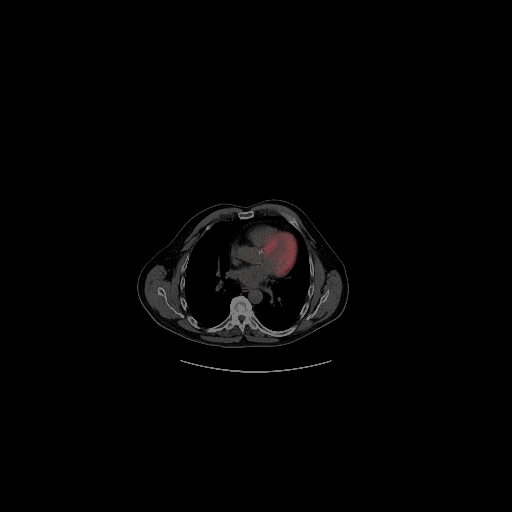
[im 214/321]
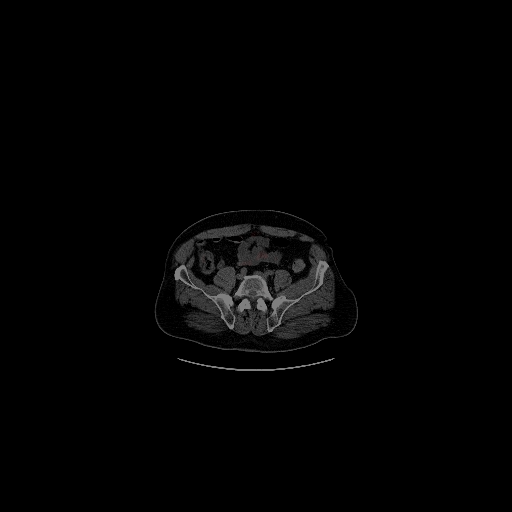
[im 321/321]
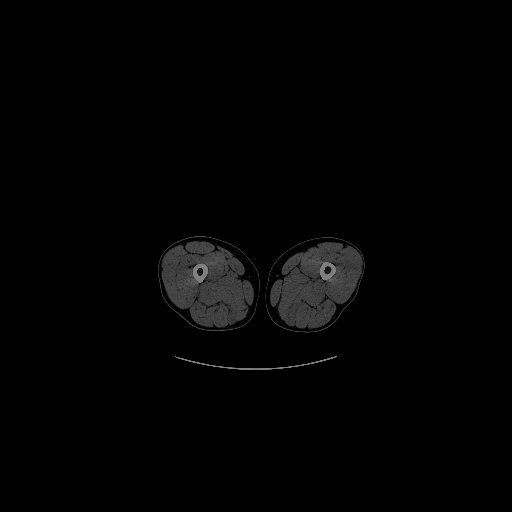

[Series 604: pet_ct coronal fused · 1 of 114 slices shown]
[im 1/114]
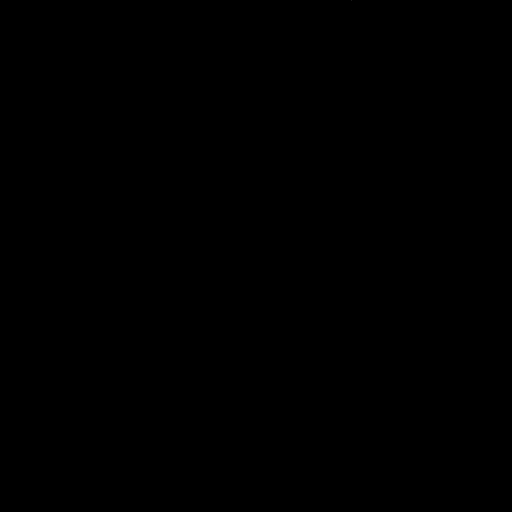

[Series 605: pet_ct sagittal fused · 2 of 146 slices shown]
[im 1/146]
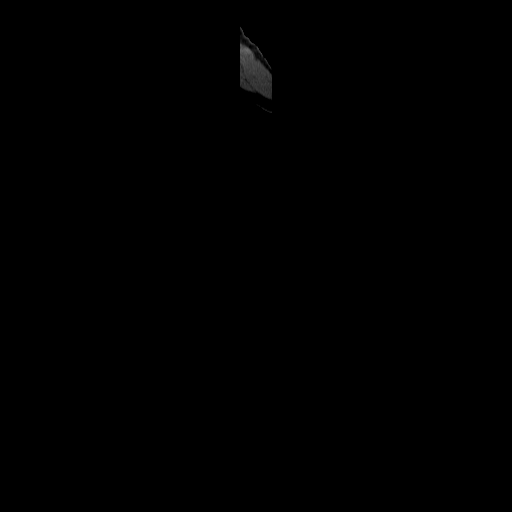
[im 146/146]
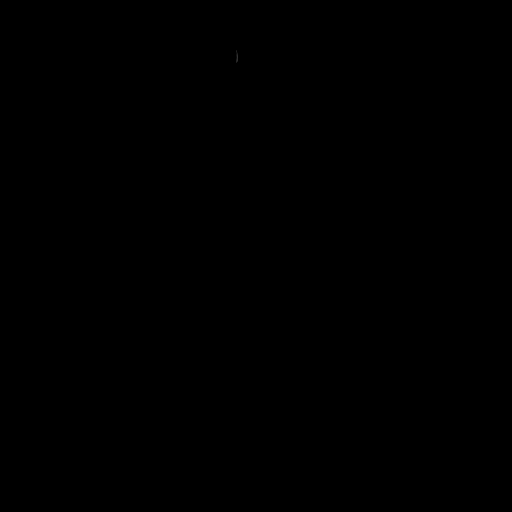

[Series 606: pet axial · 3 of 316 slices shown]
[im 1/316]
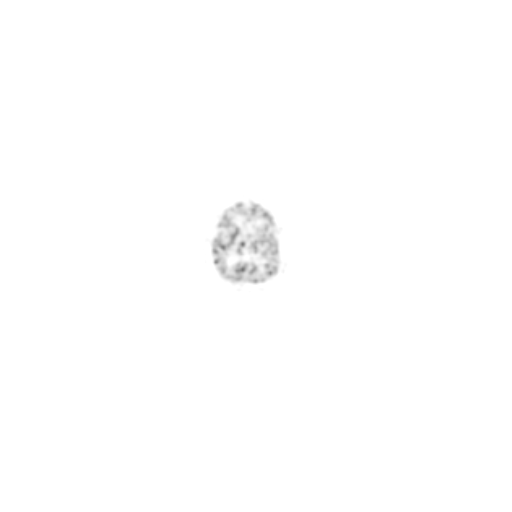
[im 158/316]
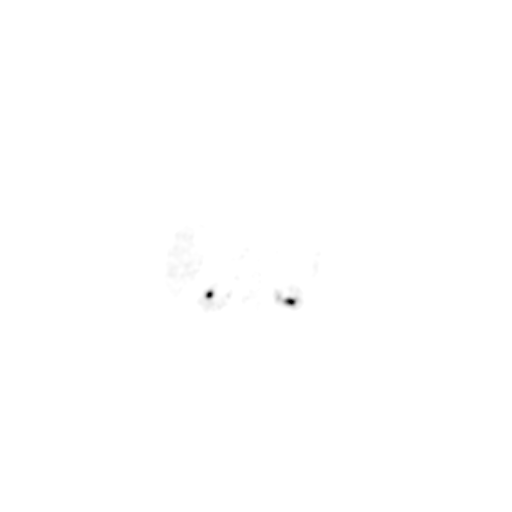
[im 316/316]
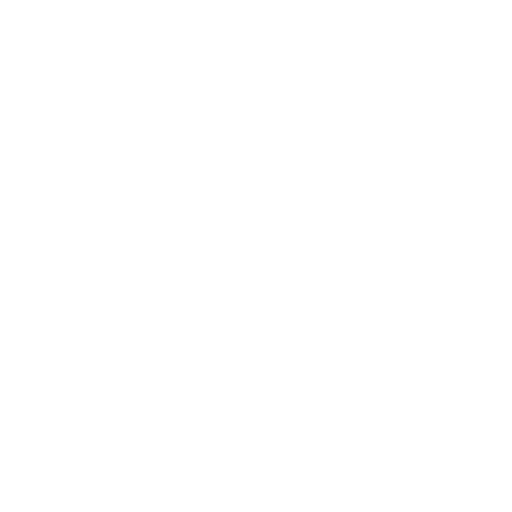

[Series 607: pet coronal · 1 of 96 slices shown]
[im 1/96]
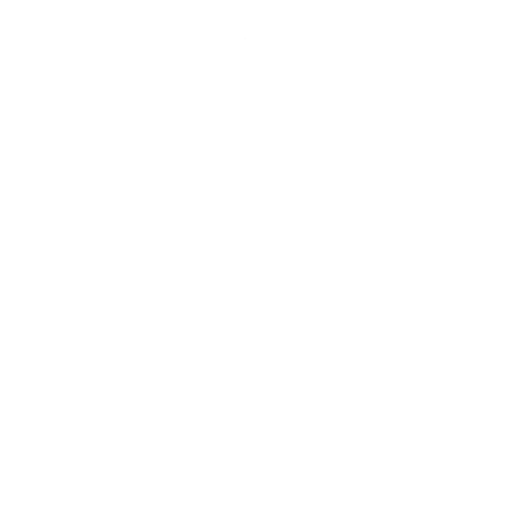

[Series 608: pet sagittal · 1 of 116 slices shown]
[im 1/116]
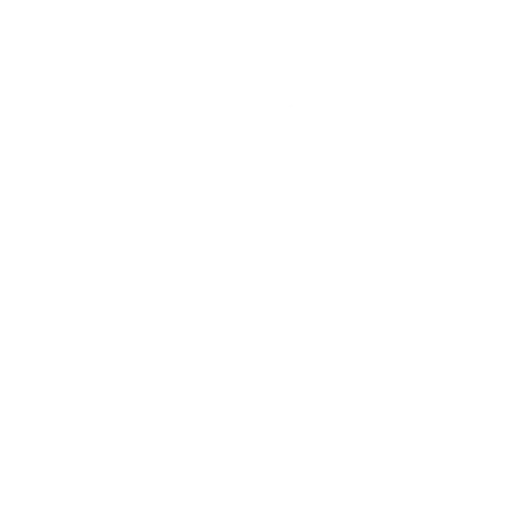

[25 of 25 positions shown; findings below may reference images not displayed]

FINDINGS: NECK

No hypermetabolic lymph nodes in the neck.

CHEST

No hypermetabolic mediastinal or hilar nodes. No suspicious
pulmonary nodules on the CT scan.

ABDOMEN/PELVIS

Single hypermetabolic periportal lymph node is not changed in size
or metabolic activity measuring 18 mm (image 222, series 3) with SUV
max equal 17.0 compared to 17.8.

No new hypermetabolic lymph nodes in the abdomen or pelvis. No
abnormal activity liver. No abnormal activity associated with the
bowel.

Multiple diverticula of the sigmoid colon.  Gallstones noted.

SKELETON

No focal hypermetabolic activity to suggest skeletal metastasis.
IMPRESSION: 1. Stable exam with extensive hypermetabolic periportal lymph node.
2. No evidence of new metabolic lesions on FDG PET CT scan.

## 2018-01-09 ENCOUNTER — Other Ambulatory Visit: Payer: Self-pay | Admitting: *Deleted

## 2018-01-09 DIAGNOSIS — C7A8 Other malignant neuroendocrine tumors: Secondary | ICD-10-CM

## 2018-01-10 ENCOUNTER — Other Ambulatory Visit: Payer: Self-pay

## 2018-01-10 ENCOUNTER — Inpatient Hospital Stay: Payer: Medicare Other | Attending: Oncology

## 2018-01-10 DIAGNOSIS — C7B8 Other secondary neuroendocrine tumors: Secondary | ICD-10-CM | POA: Insufficient documentation

## 2018-01-10 DIAGNOSIS — C7A1 Malignant poorly differentiated neuroendocrine tumors: Secondary | ICD-10-CM | POA: Insufficient documentation

## 2018-01-10 DIAGNOSIS — C7A8 Other malignant neuroendocrine tumors: Secondary | ICD-10-CM

## 2018-01-10 LAB — COMPREHENSIVE METABOLIC PANEL
ALBUMIN: 4.4 g/dL (ref 3.5–5.0)
ALT: 40 U/L (ref 0–44)
AST: 33 U/L (ref 15–41)
Alkaline Phosphatase: 46 U/L (ref 38–126)
Anion gap: 5 (ref 5–15)
BUN: 10 mg/dL (ref 8–23)
CHLORIDE: 102 mmol/L (ref 98–111)
CO2: 25 mmol/L (ref 22–32)
Calcium: 8.9 mg/dL (ref 8.9–10.3)
Creatinine, Ser: 1.01 mg/dL (ref 0.61–1.24)
GFR calc Af Amer: >60 mL/min (ref >60)
GLUCOSE: 148 mg/dL — AB (ref 70–99)
POTASSIUM: 4 mmol/L (ref 3.5–5.1)
SODIUM: 132 mmol/L — AB (ref 135–145)
Total Bilirubin: 0.6 mg/dL (ref 0.3–1.2)
Total Protein: 7.2 g/dL (ref 6.5–8.1)

## 2018-01-10 LAB — CBC WITH DIFFERENTIAL/PLATELET
ABS IMMATURE GRANULOCYTES: 0.01 10*3/uL (ref 0.00–0.07)
Basophils Absolute: 0 10*3/uL (ref 0.0–0.1)
Basophils Relative: 1 %
Eosinophils Absolute: 0.1 10*3/uL (ref 0.0–0.5)
Eosinophils Relative: 3 %
HCT: 39.7 % (ref 39.0–52.0)
HEMOGLOBIN: 14 g/dL (ref 13.0–17.0)
Immature Granulocytes: 0 %
LYMPHS PCT: 16 %
Lymphs Abs: 0.7 10*3/uL (ref 0.7–4.0)
MCH: 30.9 pg (ref 26.0–34.0)
MCHC: 35.3 g/dL (ref 30.0–36.0)
MCV: 87.6 fL (ref 80.0–100.0)
Monocytes Absolute: 0.5 10*3/uL (ref 0.1–1.0)
Monocytes Relative: 12 %
NEUTROS ABS: 2.8 10*3/uL (ref 1.7–7.7)
Neutrophils Relative %: 68 %
Platelets: 185 10*3/uL (ref 150–400)
RBC: 4.53 MIL/uL (ref 4.22–5.81)
RDW: 12.4 % (ref 11.5–15.5)
WBC: 4.2 10*3/uL (ref 4.0–10.5)
nRBC: 0 % (ref 0.0–0.2)

## 2018-01-11 ENCOUNTER — Ambulatory Visit
Admission: RE | Admit: 2018-01-11 | Discharge: 2018-01-11 | Disposition: A | Discharge status: Home / Self Care | Payer: Medicare Other | Source: Ambulatory Visit | Attending: Oncology | Admitting: Oncology

## 2018-01-11 DIAGNOSIS — Z9079 Acquired absence of other genital organ(s): Secondary | ICD-10-CM | POA: Insufficient documentation

## 2018-01-11 DIAGNOSIS — C7A1 Malignant poorly differentiated neuroendocrine tumors: Secondary | ICD-10-CM | POA: Diagnosis not present

## 2018-01-11 MED ORDER — IOPAMIDOL (ISOVUE-300) INJECTION 61%
100.0000 mL | Freq: Once | INTRAVENOUS | Status: AC | PRN
  Administered 2018-01-11: 100 mL via INTRAVENOUS

## 2018-01-15 NOTE — Progress Notes (Signed)
[place]  Telephone:(336) [phone] [phone]) [phone]  ID: Alex Underwood OB: Jan 26, 1946  MR#: [id number]  [id number]#:[id number]  Patient Care Team: Kirk Ruths, MD as PCP - General (Internal Medicine)  CHIEF COMPLAINT: Stage IV neuroendocrine tumor, consistent with small cell carcinoma with parotid metastasis.  INTERVAL HISTORY: Patient returns to clinic today for further evaluation and discussion of his imaging results. He currently feels well and is asymptomatic.  He has no neurologic complaints. He denies any recent fevers or illnesses. He has a good appetite and denies weight loss. He denies any chest pain or shortness of breath.  He denies any abdominal pain.  He has no nausea, vomiting, constipation, or diarrhea.  He has no urinary complaints.  Patient feels at his baseline and offers no specific complains today.   REVIEW OF SYSTEMS:   Review of Systems  Constitutional: Negative.  Negative for fever, malaise/fatigue and weight loss.  HENT: Negative.  Negative for sore throat.   Respiratory: Positive for cough. Negative for hemoptysis and shortness of breath.   Cardiovascular: Negative.  Negative for chest pain and leg swelling.  Gastrointestinal: Negative.  Negative for abdominal pain, blood in stool, constipation, diarrhea, melena, nausea and vomiting.  Genitourinary: Negative.   Musculoskeletal: Negative.   Skin: Negative.  Negative for rash.  Neurological: Negative for sensory change, focal weakness, weakness and headaches.  Psychiatric/Behavioral: Negative.  The patient is not nervous/anxious.     As per HPI. Otherwise, a complete review of systems is negative.  PAST MEDICAL HISTORY: Past Medical History:  Diagnosis Date  . Cancer (HCC)    PROSTATE,BASAL CELL-FOREHEAD, RIGHT EYE-SQUAMOUS CELL RIGHT EAR  . Depression    Since 04/12  . GERD (gastroesophageal reflux disease)    H/O  . Hypertension   . Sleep apnea    USES CPAP    PAST SURGICAL  HISTORY: Past Surgical History:  Procedure Laterality Date  . COLONOSCOPY    . EYE SURGERY Bilateral    CATARACTS  . PAROTIDECTOMY Right 07/21/2015   Procedure: PAROTIDECTOMY;  Surgeon: Margaretha Sheffield, MD;  Location: [facility] ORS;  Service: ENT;  Laterality: Right;  . PENILE PROSTHESIS IMPLANT    . PROSTATECTOMY    . TOE SURGERY      FAMILY HISTORY: Reviewed and unchanged. No reported history of malignancy or chronic disease.     ADVANCED DIRECTIVES:    HEALTH MAINTENANCE: Social History   Tobacco Use  . Smoking status: Never Smoker  . Smokeless tobacco: Never Used  Substance Use Topics  . Alcohol use: No    Alcohol/week: 24.0 standard drinks    Types: 24 Cans of beer per week  . Drug use: No     Colonoscopy:  PAP:  Bone density:  Lipid panel:  Allergies  Allergen Reactions  . Codeine Other (See Comments)    Hallucinations.  . Lamisil [Terbinafine] Rash  . Penicillins Rash  . Terbinafine Rash    Current Outpatient Medications  Medication Sig Dispense Refill  . amLODipine (NORVASC) 5 MG tablet Take 5 mg by mouth every morning.     Marland Kitchen aspirin 81 MG chewable tablet Chew 81 mg by mouth daily.     Marland Kitchen desvenlafaxine (PRISTIQ) 50 MG 24 hr tablet Take 50 mg by mouth every morning.     . testosterone (ANDRODERM) 4 MG/24HR PT24 patch Place 1 patch onto the skin daily.    . traZODone (DESYREL) 150 MG tablet Take by mouth at bedtime.     No current facility-administered  medications for this visit.     OBJECTIVE: Vitals:   01/16/18 1439  BP: 129/74  Pulse: (!) 56  Resp: 18  Temp: 98.2 F (36.8 C)     Body mass index is 35.75 kg/m.    ECOG FS:0 - Asymptomatic  General: Well-developed, well-nourished, no acute distress. Eyes: Pink conjunctiva, anicteric sclera. HEENT: Normocephalic, moist mucous membranes, clear oropharnyx.  Well-healed surgical scar on right neck. Lungs: Clear to auscultation bilaterally. Heart: Regular rate and rhythm. No rubs, murmurs, or  gallops. Abdomen: Soft, nontender, nondistended. No organomegaly noted, normoactive bowel sounds. Musculoskeletal: No edema, cyanosis, or clubbing. Neuro: Alert, answering all questions appropriately. Cranial nerves grossly intact. Skin: No rashes or petechiae noted. Psych: Normal affect.  LAB RESULTS:  Lab Results  Component Value Date   NA 132 (L) 01/10/2018   K 4.0 01/10/2018   CL 102 01/10/2018   CO2 25 01/10/2018   GLUCOSE 148 (H) 01/10/2018   BUN 10 01/10/2018   CREATININE 1.01 01/10/2018   CALCIUM 8.9 01/10/2018   PROT 7.2 01/10/2018   ALBUMIN 4.4 01/10/2018   AST 33 01/10/2018   ALT 40 01/10/2018   ALKPHOS 46 01/10/2018   BILITOT 0.6 01/10/2018   GFRNONAA >60 01/10/2018   GFRAA >60 01/10/2018    Lab Results  Component Value Date   WBC 4.2 01/10/2018   NEUTROABS 2.8 01/10/2018   HGB 14.0 01/10/2018   HCT 39.7 01/10/2018   MCV 87.6 01/10/2018   PLT 185 01/10/2018     STUDIES: Ct Abdomen Pelvis W Contrast  Result Date: 01/11/2018 CLINICAL DATA:  Stage IV neuroendocrine tumor. History of prostate cancer status post resection. EXAM: CT ABDOMEN AND PELVIS WITH CONTRAST TECHNIQUE: Multidetector CT imaging of the abdomen and pelvis was performed using the standard protocol following bolus administration of intravenous contrast. CONTRAST:  171mL ISOVUE-300 IOPAMIDOL (ISOVUE-300) INJECTION 61% COMPARISON:  PET-CT dated 07/07/2017 FINDINGS: Lower chest: Lung bases are clear. Hepatobiliary: Liver is notable for hepatic steatosis with areas of focal fatty sparing. Numerous layering gallstones, without associated inflammatory changes. No intrahepatic or extrahepatic ductal dilatation. Pancreas: Within normal limits. Spleen: Within normal limits. Adrenals/Urinary Tract: Adrenal glands are within normal limits. Kidneys are within normal limits. No hydronephrosis. Bladder is within normal limits. Stomach/Bowel: Stomach is notable for a tiny hiatal hernia. No evidence of bowel  obstruction. Normal appendix (series 2/image 71). Sigmoid diverticulosis, without evidence of diverticulitis. Mild to moderate colonic stool burden, suggesting constipation. Vascular/Lymphatic: No evidence of abdominal aortic aneurysm. Atherosclerotic calcifications of the abdominal aorta and branch vessels. 1.7 cm short axis node in the porta hepatis (series 2/image 24), previously 2.0 cm, hypermetabolic on prior PET-CTs. 3.6 x 2.4 cm fluid density lesion along the right pelvic sidewall (series 2/image 77), chronic and non FDG avid, benign. Reproductive: Status post prostatectomy. Penile prosthesis with reservoir in the right lower quadrant. Other: No abdominopelvic ascites. Musculoskeletal: Degenerative changes of the visualized thoracolumbar spine. IMPRESSION: 1.7 cm short axis node in the porta hepatis, hypermetabolic on prior PET-CTs and suspicious for nodal metastasis, decreased. No evidence of new/progressive metastatic disease in this patient with history of neuroendocrine tumor. Status post prostatectomy. Additional ancillary findings as above. Electronically Signed   By: Julian Hy M.D.   On: 01/11/2018 13:42    ASSESSMENT: Stage IV neuroendocrine tumor, consistent with small cell carcinoma with parotid metastasis.  PLAN:    1. Stage IV neuroendocrine tumor, consistent with small cell carcinoma with parotid metastasis: Patient completed 3 cycles of carboplatinum and etoposide on October 10, 2015.  CT scan results from January 11, 2018 reviewed independently and report as above with mild decrease in size of known lymph node in patient's porta hepatis.  Previously, EUS biopsy of this lesion revealed a low-grade neuroendocrine tumor slightly different than the high-grade/small cell carcinoma noted in his parotid gland. This was thought to be the primary lesion and the parotid lesion was metastasis.  Previously, MRI the brain reviewed independently without metastatic disease.  Will continue simple  observation.  Return to clinic in 6 months with repeat imaging and further evaluation.  Will continue imaging and evaluation every 6 months until patient is 3 years removed from his treatment then switch to yearly imaging.  I spent a total of 20 minutes face-to-face with the patient of which greater than 50% of the visit was spent in counseling and coordination of care as detailed above.   Patient expressed understanding and was in agreement with this plan. He also understands that He can call clinic at any time with any questions, concerns, or complaints.    Lloyd Huger, MD   01/17/2018 9:19 AM

## 2018-01-16 ENCOUNTER — Encounter: Payer: Self-pay | Admitting: Oncology

## 2018-01-16 ENCOUNTER — Inpatient Hospital Stay: Payer: Medicare Other | Attending: Oncology | Admitting: Oncology

## 2018-01-16 VITALS — BP 129/74 | HR 56 | Temp 98.2°F | Resp 18 | Wt 242.1 lb

## 2018-01-16 DIAGNOSIS — C7A1 Malignant poorly differentiated neuroendocrine tumors: Secondary | ICD-10-CM | POA: Diagnosis not present

## 2018-01-16 DIAGNOSIS — Z79899 Other long term (current) drug therapy: Secondary | ICD-10-CM | POA: Insufficient documentation

## 2018-01-16 DIAGNOSIS — C7B8 Other secondary neuroendocrine tumors: Secondary | ICD-10-CM | POA: Insufficient documentation

## 2018-01-16 NOTE — Progress Notes (Signed)
Pt in for follow up, denies any concerns today. 

## 2018-01-19 ENCOUNTER — Other Ambulatory Visit
Admission: RE | Admit: 2018-01-19 | Discharge: 2018-01-19 | Disposition: A | Discharge status: Home / Self Care | Payer: Medicare Other | Source: Ambulatory Visit | Attending: Internal Medicine | Admitting: Internal Medicine

## 2018-01-19 DIAGNOSIS — R0602 Shortness of breath: Secondary | ICD-10-CM | POA: Insufficient documentation

## 2018-01-19 LAB — TROPONIN I

## 2018-01-19 LAB — FIBRIN DERIVATIVES D-DIMER (ARMC ONLY): FIBRIN DERIVATIVES D-DIMER (ARMC): 305.95 ng{FEU}/mL (ref 0.00–499.00)

## 2018-07-16 ENCOUNTER — Other Ambulatory Visit: Payer: Self-pay

## 2018-07-17 ENCOUNTER — Other Ambulatory Visit: Payer: Self-pay

## 2018-07-17 ENCOUNTER — Inpatient Hospital Stay: Payer: Medicare Other | Attending: Oncology

## 2018-07-17 ENCOUNTER — Ambulatory Visit
Admission: RE | Admit: 2018-07-17 | Discharge: 2018-07-17 | Disposition: A | Discharge status: Home / Self Care | Payer: Medicare Other | Source: Ambulatory Visit | Attending: Oncology | Admitting: Oncology

## 2018-07-17 DIAGNOSIS — C7A1 Malignant poorly differentiated neuroendocrine tumors: Secondary | ICD-10-CM | POA: Insufficient documentation

## 2018-07-17 DIAGNOSIS — C7A8 Other malignant neuroendocrine tumors: Secondary | ICD-10-CM | POA: Insufficient documentation

## 2018-07-17 HISTORY — DX: Malignant neoplasm of prostate: C61

## 2018-07-17 HISTORY — DX: Malignant neoplasm of unspecified part of unspecified bronchus or lung: C34.90

## 2018-07-17 HISTORY — DX: Basal cell carcinoma of skin, unspecified: C44.91

## 2018-07-17 HISTORY — DX: Other malignant neuroendocrine tumors: C7A.8

## 2018-07-17 LAB — CBC WITH DIFFERENTIAL/PLATELET
Abs Immature Granulocytes: 0 10*3/uL (ref 0.00–0.07)
Basophils Absolute: 0 10*3/uL (ref 0.0–0.1)
Basophils Relative: 1 %
Eosinophils Absolute: 0.1 10*3/uL (ref 0.0–0.5)
Eosinophils Relative: 3 %
HCT: 39.5 % (ref 39.0–52.0)
Hemoglobin: 14.3 g/dL (ref 13.0–17.0)
Immature Granulocytes: 0 %
Lymphocytes Relative: 18 %
Lymphs Abs: 0.8 10*3/uL (ref 0.7–4.0)
MCH: 31.8 pg (ref 26.0–34.0)
MCHC: 36.2 g/dL — ABNORMAL HIGH (ref 30.0–36.0)
MCV: 87.8 fL (ref 80.0–100.0)
Monocytes Absolute: 0.5 10*3/uL (ref 0.1–1.0)
Monocytes Relative: 11 %
Neutro Abs: 3 10*3/uL (ref 1.7–7.7)
Neutrophils Relative %: 67 %
Platelets: 179 10*3/uL (ref 150–400)
RBC: 4.5 MIL/uL (ref 4.22–5.81)
RDW: 12.9 % (ref 11.5–15.5)
WBC: 4.5 10*3/uL (ref 4.0–10.5)
nRBC: 0 % (ref 0.0–0.2)

## 2018-07-17 LAB — COMPREHENSIVE METABOLIC PANEL
ALT: 40 U/L (ref 0–44)
AST: 28 U/L (ref 15–41)
Albumin: 4.2 g/dL (ref 3.5–5.0)
Alkaline Phosphatase: 44 U/L (ref 38–126)
Anion gap: 5 (ref 5–15)
BUN: 14 mg/dL (ref 8–23)
CO2: 27 mmol/L (ref 22–32)
Calcium: 8.7 mg/dL — ABNORMAL LOW (ref 8.9–10.3)
Chloride: 104 mmol/L (ref 98–111)
Creatinine, Ser: 1.07 mg/dL (ref 0.61–1.24)
GFR calc Af Amer: >60 mL/min (ref >60)
GFR calc non Af Amer: >60 mL/min (ref >60)
Glucose, Bld: 128 mg/dL — ABNORMAL HIGH (ref 70–99)
Potassium: 4.1 mmol/L (ref 3.5–5.1)
Sodium: 136 mmol/L (ref 135–145)
Total Bilirubin: 0.8 mg/dL (ref 0.3–1.2)
Total Protein: 7 g/dL (ref 6.5–8.1)

## 2018-07-17 MED ORDER — IOHEXOL 300 MG/ML  SOLN
100.0000 mL | Freq: Once | INTRAMUSCULAR | Status: AC | PRN
  Administered 2018-07-17: 10:00:00 100 mL via INTRAVENOUS

## 2018-07-18 ENCOUNTER — Telehealth: Payer: Self-pay | Admitting: Oncology

## 2018-07-19 ENCOUNTER — Encounter: Payer: Self-pay | Admitting: Oncology

## 2018-07-19 ENCOUNTER — Inpatient Hospital Stay (HOSPITAL_BASED_OUTPATIENT_CLINIC_OR_DEPARTMENT_OTHER): Payer: Medicare Other | Admitting: Oncology

## 2018-07-19 ENCOUNTER — Other Ambulatory Visit: Payer: Self-pay

## 2018-07-19 DIAGNOSIS — C7A1 Malignant poorly differentiated neuroendocrine tumors: Secondary | ICD-10-CM

## 2018-07-19 NOTE — Progress Notes (Signed)
Patient stated that he had been doing well with no complaints. 

## 2018-07-19 NOTE — Progress Notes (Signed)
Alex Underwood  [phone] [phone]  ID: Alex Underwood OB: Jan 22, 1946  MR#: [id number]  TFT#:[id number]  Patient Care Team: Kirk Ruths, MD as PCP - General (Internal Medicine)  I connected with Alex Underwood Counsell on 07/20/18 at 11:00 AM EDT by telephone visit and verified that I am speaking with the correct person using two identifiers.   I discussed the limitations, risks, security and privacy concerns of performing an evaluation and management service by telemedicine and the availability of in-person appointments. I also discussed with the patient that there may be a patient responsible charge related to this service. The patient expressed understanding and agreed to proceed.   Other persons participating in the visit and their role in the encounter: Patient, MD  Patient's location: Home Provider's location: Clinic  CHIEF COMPLAINT: Stage IV neuroendocrine tumor, consistent with small cell carcinoma with parotid metastasis.  INTERVAL HISTORY: Patient agreed to telephone visit for further evaluation and discussion of his imaging results.  He continues to feel well and remains asymptomatic.  He remains very active.  He has no neurologic complaints. He denies any recent fevers or illnesses. He has a good appetite and denies weight loss. He denies any chest pain or shortness of breath.  He denies any abdominal pain.  He has no nausea, vomiting, constipation, or diarrhea.  He has no urinary complaints.  Patient feels at his baseline offers no specific complaints today.  REVIEW OF SYSTEMS:   Review of Systems  Constitutional: Negative.  Negative for fever, malaise/fatigue and weight loss.  HENT: Negative.  Negative for sore throat.   Respiratory: Negative.  Negative for cough, hemoptysis and shortness of breath.   Cardiovascular: Negative.  Negative for chest pain and leg swelling.  Gastrointestinal: Negative.  Negative for abdominal pain, blood in  stool, constipation, diarrhea, melena, nausea and vomiting.  Genitourinary: Negative.   Musculoskeletal: Negative.   Skin: Negative.  Negative for rash.  Neurological: Negative for sensory change, focal weakness, weakness and headaches.  Psychiatric/Behavioral: Negative.  The patient is not nervous/anxious.     As per HPI. Otherwise, a complete review of systems is negative.  PAST MEDICAL HISTORY: Past Medical History:  Diagnosis Date  . Depression    Since 04/12  . GERD (gastroesophageal reflux disease)    H/O  . Hypertension   . Neuroendocrine cancer ([place])   . Prostate cancer ([place])   . Skin cancer, basal cell   . Sleep apnea    USES CPAP  . Squamous cell lung cancer ([place])     PAST SURGICAL HISTORY: Past Surgical History:  Procedure Laterality Date  . COLONOSCOPY    . EYE SURGERY Bilateral    CATARACTS  . PAROTIDECTOMY Right 07/21/2015   Procedure: PAROTIDECTOMY;  Surgeon: Margaretha Sheffield, MD;  Location: [facility] ORS;  Service: ENT;  Laterality: Right;  . PENILE PROSTHESIS IMPLANT    . PROSTATECTOMY    . TOE SURGERY      FAMILY HISTORY: Reviewed and unchanged. No reported history of malignancy or chronic disease.     ADVANCED DIRECTIVES:    HEALTH MAINTENANCE: Social History   Tobacco Use  . Smoking status: Never Smoker  . Smokeless tobacco: Never Used  Substance Use Topics  . Alcohol use: No    Alcohol/week: 24.0 standard drinks    Types: 24 Cans of beer per week  . Drug use: No     Colonoscopy:  PAP:  Bone density:  Lipid panel:  Allergies  Allergen Reactions  .  Codeine Other (See Comments)    Hallucinations.  . Lamisil [Terbinafine] Rash  . Penicillins Rash  . Terbinafine Rash    Current Outpatient Medications  Medication Sig Dispense Refill  . amLODipine (NORVASC) 5 MG tablet Take 5 mg by mouth every morning.     Marland Kitchen aspirin 81 MG chewable tablet Chew 81 mg by mouth daily.     Marland Kitchen desvenlafaxine (PRISTIQ) 50 MG 24 hr tablet Take 50 mg by mouth every  morning.     . testosterone (ANDRODERM) 4 MG/24HR PT24 patch Place 1 patch onto the skin daily.    . traZODone (DESYREL) 150 MG tablet Take by mouth at bedtime.     No current facility-administered medications for this visit.     OBJECTIVE: There were no vitals filed for this visit.   There is no height or weight on file to calculate BMI.    ECOG FS:0 - Asymptomatic   LAB RESULTS:  Lab Results  Component Value Date   NA 136 07/17/2018   K 4.1 07/17/2018   CL 104 07/17/2018   CO2 27 07/17/2018   GLUCOSE 128 (H) 07/17/2018   BUN 14 07/17/2018   CREATININE 1.07 07/17/2018   CALCIUM 8.7 (L) 07/17/2018   PROT 7.0 07/17/2018   ALBUMIN 4.2 07/17/2018   AST 28 07/17/2018   ALT 40 07/17/2018   ALKPHOS 44 07/17/2018   BILITOT 0.8 07/17/2018   GFRNONAA >60 07/17/2018   GFRAA >60 07/17/2018    Lab Results  Component Value Date   WBC 4.5 07/17/2018   NEUTROABS 3.0 07/17/2018   HGB 14.3 07/17/2018   HCT 39.5 07/17/2018   MCV 87.8 07/17/2018   PLT 179 07/17/2018     STUDIES: Ct Abdomen Pelvis W Contrast  Result Date: 07/17/2018 CLINICAL DATA:  Follow-up stage IV neuroendocrine carcinoma. Personal history of prostate carcinoma. EXAM: CT ABDOMEN AND PELVIS WITH CONTRAST TECHNIQUE: Multidetector CT imaging of the abdomen and pelvis was performed using the standard protocol following bolus administration of intravenous contrast. CONTRAST:  167mL OMNIPAQUE IOHEXOL 300 MG/ML  SOLN COMPARISON:  01/11/2018 FINDINGS: Lower Chest: No acute findings. Hepatobiliary: Moderate diffuse hepatic steatosis. Stable small low-attenuation lesion in inferior right hepatic lobe. Multiple calcified gallstones are seen, however there is no evidence of cholecystitis or biliary ductal dilatation. Pancreas:  No mass or inflammatory changes. Spleen: Within normal limits in size and appearance. Adrenals/Urinary Tract: No masses identified. No evidence of hydronephrosis. Unremarkable unopacified urinary bladder.  Stomach/Bowel: No evidence of obstruction, inflammatory process or abnormal fluid collections. Diverticulosis is seen mainly involving the sigmoid colon, however there is no evidence of diverticulitis. Vascular/Lymphatic: No pathologically enlarged lymph nodes. Stable small postop lymphocele or seroma adjacent to the right external iliac vessels. No abdominal aortic aneurysm. Aortic atherosclerosis. Reproductive:  Prior prostatectomy. Penile implant remains in place. Other:  None. Musculoskeletal:  No suspicious bone lesions identified. IMPRESSION: 1. Stable exam. No evidence of recurrent or metastatic carcinoma within the abdomen or pelvis. 2. Colonic diverticulosis, without radiographic evidence of diverticulitis. 3. Moderate hepatic steatosis. 4. Cholelithiasis. No radiographic evidence of cholecystitis. Aortic Atherosclerosis (ICD10-I70.0). Electronically Signed   By: Earle Gell M.D.   On: 07/17/2018 16:30    ASSESSMENT: Stage IV neuroendocrine tumor, consistent with small cell carcinoma with parotid metastasis.  PLAN:    1. Stage IV neuroendocrine tumor, consistent with small cell carcinoma with parotid metastasis: Patient completed 3 cycles of carboplatinum and etoposide on October 10, 2015.  CT scan results from Jul 17, 2018 reviewed independently  and reported as above unchanged from previous with no evidence of recurrent or progressive disease.  The known lymph node in patient's porta hepatis is stable.  Previously, EUS biopsy of this lesion revealed a low-grade neuroendocrine tumor slightly different than the high-grade/small cell carcinoma noted in his parotid gland. This was thought to be the primary lesion and the parotid lesion was metastasis.  Previously, MRI the brain reviewed independently without metastatic disease.  Will continue simple observation.  Patient is now nearly 3 years removed from completing his chemotherapy and can be transitioned to yearly imaging.  Return to clinic in 1 year for  further evaluation.  I provided 15 minutes of non face-to-face telephone visit time during this encounter, and > 50% was spent counseling as documented under my assessment & plan.   Patient expressed understanding and was in agreement with this plan. He also understands that He can call clinic at any time with any questions, concerns, or complaints.    Alex Huger, MD   07/20/2018 7:02 AM

## 2019-04-04 ENCOUNTER — Ambulatory Visit: Payer: Medicare Other | Attending: Internal Medicine

## 2019-04-04 DIAGNOSIS — Z23 Encounter for immunization: Secondary | ICD-10-CM

## 2019-04-04 NOTE — Progress Notes (Signed)
   Covid-19 Vaccination Clinic  Name:  Alex Underwood    MRN: [id number] DOB: 22-Feb-1946  04/04/2019  [name] was observed post Covid-19 immunization for 15 minutes without incidence. He was provided with Vaccine Information Sheet and instruction to access the V-Safe system.   [name] was instructed to call 911 with any severe reactions post vaccine: Marland Kitchen Difficulty breathing  . Swelling of your face and throat  . A fast heartbeat  . A bad rash all over your body  . Dizziness and weakness    Immunizations Administered    Name Date Dose VIS Date Route   Pfizer COVID-19 Vaccine 04/04/2019 11:20 AM 0.3 mL 02/23/2019 Intramuscular   Manufacturer: [place]   Lot: [id number]   [place]: [id number]

## 2019-04-24 ENCOUNTER — Ambulatory Visit: Payer: Medicare Other | Attending: Internal Medicine

## 2019-04-24 DIAGNOSIS — Z23 Encounter for immunization: Secondary | ICD-10-CM | POA: Insufficient documentation

## 2019-04-24 NOTE — Progress Notes (Signed)
   Covid-19 Vaccination Clinic  Name:  Alex Underwood    MRN: [id number] DOB: [birth year]  04/24/2019  [name] was observed post Covid-19 immunization for 15 minutes without incidence. He was provided with Vaccine Information Sheet and instruction to access the V-Safe system.   [name] was instructed to call 911 with any severe reactions post vaccine: Marland Kitchen Difficulty breathing  . Swelling of your face and throat  . A fast heartbeat  . A bad rash all over your body  . Dizziness and weakness    Immunizations Administered    Name Date Dose VIS Date Route   Pfizer COVID-19 Vaccine 04/24/2019 10:31 AM 0.3 mL 02/23/2019 Intramuscular   Manufacturer: [place]   Lot: [id number]   [place]: [id number]

## 2019-07-10 ENCOUNTER — Other Ambulatory Visit: Payer: Self-pay

## 2019-07-10 ENCOUNTER — Encounter: Payer: Self-pay | Admitting: Dermatology

## 2019-07-10 ENCOUNTER — Ambulatory Visit (INDEPENDENT_AMBULATORY_CARE_PROVIDER_SITE_OTHER): Payer: Medicare Other | Admitting: Dermatology

## 2019-07-10 DIAGNOSIS — D692 Other nonthrombocytopenic purpura: Secondary | ICD-10-CM | POA: Diagnosis not present

## 2019-07-10 DIAGNOSIS — L578 Other skin changes due to chronic exposure to nonionizing radiation: Secondary | ICD-10-CM | POA: Diagnosis not present

## 2019-07-10 DIAGNOSIS — L820 Inflamed seborrheic keratosis: Secondary | ICD-10-CM | POA: Diagnosis not present

## 2019-07-10 DIAGNOSIS — L821 Other seborrheic keratosis: Secondary | ICD-10-CM

## 2019-07-10 DIAGNOSIS — L738 Other specified follicular disorders: Secondary | ICD-10-CM | POA: Diagnosis not present

## 2019-07-10 DIAGNOSIS — L570 Actinic keratosis: Secondary | ICD-10-CM

## 2019-07-10 NOTE — Patient Instructions (Signed)

## 2019-07-10 NOTE — Progress Notes (Signed)
   Follow-Up Visit   Subjective  Alex Underwood is a 74 y.o. male who presents for the following: Actinic Keratosis (follow up - scalp, face, ears. LN2 x 18 03/2019, PDT to scalp and temples 05/2019).    The following portions of the chart were reviewed this encounter and updated as appropriate:  Tobacco  Allergies  Meds  Problems  Med Hx  Surg Hx  Fam Hx      Review of Systems:  No other skin or systemic complaints except as noted in HPI or Assessment and Plan.  Objective  Well appearing patient in no apparent distress; mood and affect are within normal limits.  A focused examination was performed including scalp, face. Relevant physical exam findings are noted in the Assessment and Plan.  Objective  Scalp, face (18): Erythematous thin papules/macules with gritty scale.   Objective  Face and scalp (5), Hands and arms (5): Erythematous keratotic or waxy stuck-on papule or plaque.   Objective  Face: Small yellow papules with a central dell.    Assessment & Plan    Actinic Damage - diffuse scaly erythematous macules with underlying dyspigmentation - Recommend daily broad spectrum sunscreen SPF 30+ to sun-exposed areas, reapply every 2 hours as needed.  - Call for new or changing lesions.  Seborrheic Keratoses - Stuck-on, waxy, tan-brown papules and plaques  - Discussed benign etiology and prognosis. - Observe - Call for any changes  Purpura - Violaceous macules and patches - Benign - Related to age, sun damage and/or use of blood thinners - Observe - Can use OTC arnica containing moisturizer such as Dermend Bruise Formula if desired - Call for worsening or other concerns     AK (actinic keratosis) (18) Scalp, face  Destruction of lesion - Scalp, face Complexity: simple   Destruction method: cryotherapy   Informed consent: discussed and consent obtained   Timeout:  patient name, date of birth, surgical site, and procedure verified Lesion destroyed  using liquid nitrogen: Yes   Region frozen until ice ball extended beyond lesion: Yes   Outcome: patient tolerated procedure well with no complications   Post-procedure details: wound care instructions given    Inflamed seborrheic keratosis (10) Face and scalp (5); Hands and arms (5)  Destruction of lesion - Face and scalp Complexity: simple   Destruction method: cryotherapy   Informed consent: discussed and consent obtained   Timeout:  patient name, date of birth, surgical site, and procedure verified Lesion destroyed using liquid nitrogen: Yes   Region frozen until ice ball extended beyond lesion: Yes   Outcome: patient tolerated procedure well with no complications   Post-procedure details: wound care instructions given    Sebaceous hyperplasia Face  Observe   Return in about 6 months (around 01/09/2020).  I, Ashok Cordia, CMA, am acting as scribe for Sarina Ser, MD .    Documentation: I have reviewed the above documentation for accuracy and completeness, and I agree with the above.  Sarina Ser, MD

## 2019-07-14 NOTE — Progress Notes (Signed)
[name]  [phone]) [phone] Fax:(336) [phone]  ID: Alex Underwood OB: 07-Sep-1945  MR#: [id number]  DGU#:[id number]  Patient Care Team: Kirk Ruths, MD as PCP - General (Internal Medicine) Lloyd Huger, MD as Consulting Physician (Oncology)  CHIEF COMPLAINT: Stage IV neuroendocrine tumor, consistent with small cell carcinoma with parotid metastasis.  INTERVAL HISTORY: Patient returns to clinic today for routine evaluation and discussion of his imaging results.  He continues to feel well and remains asymptomatic. He has no neurologic complaints. He denies any recent fevers or illnesses. He has a good appetite and denies weight loss.  He denies any chest pain, shortness of breath, cough, or hemoptysis.  He denies any abdominal pain.  He has no nausea, vomiting, constipation, or diarrhea.  He has no urinary complaints.  Patient offers no specific complaints today.  REVIEW OF SYSTEMS:   Review of Systems  Constitutional: Negative.  Negative for fever, malaise/fatigue and weight loss.  HENT: Negative.  Negative for sore throat.   Respiratory: Negative.  Negative for cough, hemoptysis and shortness of breath.   Cardiovascular: Negative.  Negative for chest pain and leg swelling.  Gastrointestinal: Negative.  Negative for abdominal pain, blood in stool, constipation, diarrhea, melena, nausea and vomiting.  Genitourinary: Negative.   Musculoskeletal: Negative.   Skin: Negative.  Negative for rash.  Neurological: Negative for sensory change, focal weakness, weakness and headaches.  Psychiatric/Behavioral: Negative.  The patient is not nervous/anxious.     As per HPI. Otherwise, a complete review of systems is negative.  PAST MEDICAL HISTORY: Past Medical History:  Diagnosis Date  . Depression    Since 04/12  . GERD (gastroesophageal reflux disease)    H/O  . 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 ear  . Hypertension   .  Neuroendocrine cancer ([place])   . Prostate cancer ([place])   . Skin cancer, basal cell    forehead, R lat. forehead  . Sleep apnea    USES CPAP  . Squamous cell lung cancer ([place])     PAST SURGICAL HISTORY: Past Surgical History:  Procedure Laterality Date  . COLONOSCOPY    . EYE SURGERY Bilateral    CATARACTS  . MOHS SURGERY  2009   forehead; [place], [employer] Right 07/21/2015   Procedure: PAROTIDECTOMY;  Surgeon: Margaretha Sheffield, MD;  Location: [facility] ORS;  Service: ENT;  Laterality: Right;  . PENILE PROSTHESIS IMPLANT    . PROSTATECTOMY    . TOE SURGERY      FAMILY HISTORY: Reviewed and unchanged. No reported history of malignancy or chronic disease.     ADVANCED DIRECTIVES:    HEALTH MAINTENANCE: Social History   Tobacco Use  . Smoking status: Never Smoker  . Smokeless tobacco: Never Used  Substance Use Topics  . Alcohol use: No    Alcohol/week: 24.0 standard drinks    Types: 24 Cans of beer per week  . Drug use: No     Colonoscopy:  PAP:  Bone density:  Lipid panel:  Allergies  Allergen Reactions  . Codeine Other (See Comments)    Hallucinations.  . Lamisil [Terbinafine] Rash  . Penicillins Rash  . Terbinafine Rash    Current Outpatient Medications  Medication Sig Dispense Refill  . amLODipine (NORVASC) 5 MG tablet Take 5 mg by mouth every morning.     Marland Kitchen aspirin 81 MG chewable tablet Chew 81 mg by mouth daily.     Marland Kitchen  desvenlafaxine (PRISTIQ) 50 MG 24 hr tablet Take 50 mg by mouth every morning.     . gabapentin (NEURONTIN) 100 MG capsule TAKE 1 CAPSULE(100 MG) BY MOUTH THREE TIMES DAILY    . testosterone (ANDRODERM) 4 MG/24HR PT24 patch Place 1 patch onto the skin daily.    . traZODone (DESYREL) 150 MG tablet Take by mouth at bedtime.     No current facility-administered medications for this visit.    OBJECTIVE: Vitals:   07/19/19 1127  BP: (!) 120/59  Pulse: (!) 56  Temp: (!) 96.3 F (35.7 C)  SpO2: 97%     Body  mass index is 36.39 kg/m.    ECOG FS:0 - Asymptomatic  General: Well-developed, well-nourished, no acute distress. Eyes: Pink conjunctiva, anicteric sclera. HEENT: Normocephalic, moist mucous membranes. Lungs: No audible wheezing or coughing. Heart: Regular rate and rhythm. Abdomen: Soft, nontender, no obvious distention. Musculoskeletal: No edema, cyanosis, or clubbing. Neuro: Alert, answering all questions appropriately. Cranial nerves grossly intact. Skin: No rashes or petechiae noted. Psych: Normal affect.  LAB RESULTS:  Lab Results  Component Value Date   NA 135 07/16/2019   K 4.1 07/16/2019   CL 100 07/16/2019   CO2 27 07/16/2019   GLUCOSE 130 (H) 07/16/2019   BUN 11 07/16/2019   CREATININE 1.18 07/16/2019   CALCIUM 8.6 (L) 07/16/2019   PROT 7.1 07/16/2019   ALBUMIN 4.1 07/16/2019   AST 22 07/16/2019   ALT 32 07/16/2019   ALKPHOS 43 07/16/2019   BILITOT 1.0 07/16/2019   GFRNONAA >60 07/16/2019   GFRAA >60 07/16/2019    Lab Results  Component Value Date   WBC 4.5 07/17/2018   NEUTROABS 3.0 07/17/2018   HGB 14.3 07/17/2018   HCT 39.5 07/17/2018   MCV 87.8 07/17/2018   PLT 179 07/17/2018     STUDIES: CT ABDOMEN PELVIS W CONTRAST  Result Date: 07/16/2019 CLINICAL DATA:  Stage IV neuroendocrine tumor status post chemotherapy. Interval observation. Restaging. EXAM: CT ABDOMEN AND PELVIS WITH CONTRAST TECHNIQUE: Multidetector CT imaging of the abdomen and pelvis was performed using the standard protocol following bolus administration of intravenous contrast. CONTRAST:  170mL OMNIPAQUE IOHEXOL 300 MG/ML  SOLN COMPARISON:  07/17/2018 CT abdomen/pelvis. FINDINGS: Lower chest: No significant pulmonary nodules or acute consolidative airspace disease. Hepatobiliary: Normal liver size. Cystic density 1.4 cm inferior right liver lesion is stable and presumably benign. No new liver lesions. Cholelithiasis. No biliary ductal dilatation. Pancreas: Normal, with no mass or duct  dilation. Spleen: Normal size. No mass. Adrenals/Urinary Tract: Normal adrenals. Normal kidneys with no hydronephrosis and no renal mass. Normal bladder. Stomach/Bowel: Normal non-distended stomach. Normal caliber small bowel with no small bowel wall thickening. Normal appendix. Oral contrast transits to the colon. Marked sigmoid diverticulosis. There is new eccentric wall thickening in the sigmoid colon with associated pericolonic fat stranding (series 2/image 81), compatible with acute sigmoid diverticulitis. No pericolonic free air or abscess. Vascular/Lymphatic: Atherosclerotic nonaneurysmal abdominal aorta. Patent portal, splenic, hepatic and renal veins. Hyperenhancing 2.0 x 1.9 cm porta hepatis mass (series 2/image 26), previously 2.0 x 1.9 cm using similar measurement technique, stable. No new pathologically enlarged lymph nodes in the abdomen or pelvis. Chronic simple cystic 3.4 x 2.1 cm structure in the right pelvic sidewall (series 2/image 79) is stable, presumably a benign lesion such as a lymphocele. Reproductive: Prostatectomy. Partially visualized penile prosthesis with reservoir in the anterior right pelvis. Other: No pneumoperitoneum, ascites or focal fluid collection. Musculoskeletal: No aggressive appearing focal osseous lesions. Moderate thoracolumbar  spondylosis. IMPRESSION: 1. Acute sigmoid diverticulitis. No free air or abscess. 2. Stable hypervascular porta hepatis mass. No new or progressive metastatic disease in the abdomen or pelvis. 3. Stable right pelvic sidewall simple cystic structure, probably a lymphocele. 4. Cholelithiasis. 5. Aortic Atherosclerosis (ICD10-I70.0). These results will be called to the ordering clinician or representative by the Radiologist Assistant, and communication documented in the PACS or [employer]. Electronically Signed   By: Ilona Sorrel M.D.   On: 07/16/2019 11:15    ASSESSMENT: Stage IV neuroendocrine tumor, consistent with small cell carcinoma with  parotid metastasis.  PLAN:    1. Stage IV neuroendocrine tumor, consistent with small cell carcinoma with parotid metastasis: Patient completed 3 cycles of carboplatinum and etoposide on October 10, 2015.  CT scan results from Jul 16, 2019 reviewed independently and report as above with no obvious evidence of progressive or recurrent disease. The known lymph node in patient's porta hepatis is stable.  Previously, EUS biopsy of this lesion revealed a low-grade neuroendocrine tumor slightly different than the high-grade/small cell carcinoma noted in his parotid gland. This was thought to be the primary lesion and the parotid lesion was metastasis.  Previously, MRI the brain reviewed independently without metastatic disease.  Will continue simple observation.  Return to clinic in 1 year with repeat imaging and further evaluation.  At this point patient will be approximately 5 years from completing treatment and possibly could be discharged from clinic. 2.  Acute diverticulitis: Noted incidentally on CT scan.  Patient is asymptomatic.  Have made a referral to GI for further evaluation and consideration of colonoscopy.    Patient expressed understanding and was in agreement with this plan. He also understands that He can call clinic at any time with any questions, concerns, or complaints.    Lloyd Huger, MD   07/19/2019 6:44 PM

## 2019-07-16 ENCOUNTER — Ambulatory Visit
Admission: RE | Admit: 2019-07-16 | Discharge: 2019-07-16 | Disposition: A | Discharge status: Home / Self Care | Payer: Medicare Other | Source: Ambulatory Visit | Attending: Oncology | Admitting: Oncology

## 2019-07-16 ENCOUNTER — Other Ambulatory Visit: Payer: Self-pay | Admitting: Emergency Medicine

## 2019-07-16 ENCOUNTER — Other Ambulatory Visit: Payer: Self-pay

## 2019-07-16 ENCOUNTER — Inpatient Hospital Stay: Payer: Medicare Other | Attending: Oncology

## 2019-07-16 ENCOUNTER — Telehealth: Payer: Self-pay | Admitting: *Deleted

## 2019-07-16 ENCOUNTER — Telehealth: Payer: Self-pay | Admitting: Emergency Medicine

## 2019-07-16 DIAGNOSIS — K5732 Diverticulitis of large intestine without perforation or abscess without bleeding: Secondary | ICD-10-CM

## 2019-07-16 DIAGNOSIS — C7B8 Other secondary neuroendocrine tumors: Secondary | ICD-10-CM | POA: Insufficient documentation

## 2019-07-16 DIAGNOSIS — C7A1 Malignant poorly differentiated neuroendocrine tumors: Secondary | ICD-10-CM | POA: Diagnosis present

## 2019-07-16 LAB — COMPREHENSIVE METABOLIC PANEL
ALT: 32 U/L (ref 0–44)
AST: 22 U/L (ref 15–41)
Albumin: 4.1 g/dL (ref 3.5–5.0)
Alkaline Phosphatase: 43 U/L (ref 38–126)
Anion gap: 8 (ref 5–15)
BUN: 11 mg/dL (ref 8–23)
CO2: 27 mmol/L (ref 22–32)
Calcium: 8.6 mg/dL — ABNORMAL LOW (ref 8.9–10.3)
Chloride: 100 mmol/L (ref 98–111)
Creatinine, Ser: 1.18 mg/dL (ref 0.61–1.24)
GFR calc Af Amer: >60 mL/min (ref >60)
GFR calc non Af Amer: >60 mL/min (ref >60)
Glucose, Bld: 130 mg/dL — ABNORMAL HIGH (ref 70–99)
Potassium: 4.1 mmol/L (ref 3.5–5.1)
Sodium: 135 mmol/L (ref 135–145)
Total Bilirubin: 1 mg/dL (ref 0.3–1.2)
Total Protein: 7.1 g/dL (ref 6.5–8.1)

## 2019-07-16 MED ORDER — IOHEXOL 300 MG/ML  SOLN
100.0000 mL | Freq: Once | INTRAMUSCULAR | Status: AC | PRN
  Administered 2019-07-16: 100 mL via INTRAVENOUS

## 2019-07-16 NOTE — Telephone Encounter (Signed)
Called to speak with patient regarding results of CT. Asked if he had been having any abd pain, fever, N/V, bowel changes. Pt denied all, reporting he felt fine and that he was doing all his usual activities and eating as normal without any problems. Told pt that we would be sending a referral to GI due to the diverticulitis seen on CT. Pt verbalized understanding and didn't have any further questions or concerns.

## 2019-07-16 NOTE — Telephone Encounter (Signed)
Called report  IMPRESSION: 1. Acute sigmoid diverticulitis. No free air or abscess. 2. Stable hypervascular porta hepatis mass. No new or progressive metastatic disease in the abdomen or pelvis. 3. Stable right pelvic sidewall simple cystic structure, probably a lymphocele. 4. Cholelithiasis. 5. Aortic Atherosclerosis (ICD10-I70.0).  These results will be called to the ordering clinician or representative by the Radiologist Assistant, and communication documented in the PACS or [employer].   Electronically Signed   By: Ilona Sorrel M.D.   On: 07/16/2019 11:15

## 2019-07-16 NOTE — Telephone Encounter (Signed)
Called and spoke with patient. Pt is asymptomatic, no pain, fever, N/V, bowel changes. Per conversation with Dr. Grayland Ormond, will send referral to GI.

## 2019-07-18 ENCOUNTER — Encounter: Payer: Self-pay | Admitting: Oncology

## 2019-07-18 NOTE — Progress Notes (Signed)
Patient was called pre assessment. Patient denied any concerns or pain.

## 2019-07-19 ENCOUNTER — Encounter: Payer: Self-pay | Admitting: Oncology

## 2019-07-19 ENCOUNTER — Inpatient Hospital Stay (HOSPITAL_BASED_OUTPATIENT_CLINIC_OR_DEPARTMENT_OTHER): Payer: Medicare Other | Admitting: Oncology

## 2019-07-19 ENCOUNTER — Other Ambulatory Visit: Payer: Self-pay

## 2019-07-19 VITALS — BP 120/59 | HR 56 | Temp 96.3°F | Wt 246.4 lb

## 2019-07-19 DIAGNOSIS — C7A1 Malignant poorly differentiated neuroendocrine tumors: Secondary | ICD-10-CM | POA: Diagnosis not present

## 2019-09-10 ENCOUNTER — Other Ambulatory Visit: Payer: Self-pay

## 2019-09-10 ENCOUNTER — Ambulatory Visit (INDEPENDENT_AMBULATORY_CARE_PROVIDER_SITE_OTHER): Payer: Medicare Other | Admitting: Gastroenterology

## 2019-09-10 ENCOUNTER — Encounter: Payer: Self-pay | Admitting: Gastroenterology

## 2019-09-10 VITALS — BP 125/62 | HR 60 | Temp 97.6°F | Ht 69.0 in | Wt 242.0 lb

## 2019-09-10 DIAGNOSIS — K5732 Diverticulitis of large intestine without perforation or abscess without bleeding: Secondary | ICD-10-CM

## 2019-09-10 DIAGNOSIS — R933 Abnormal findings on diagnostic imaging of other parts of digestive tract: Secondary | ICD-10-CM | POA: Diagnosis not present

## 2019-09-10 MED ORDER — NA SULFATE-K SULFATE-MG SULF 17.5-3.13-1.6 GM/177ML PO SOLN: 354.0000 mL | Freq: Once | ORAL | 0 refills | Status: AC

## 2019-09-10 NOTE — Progress Notes (Signed)
Jonathon Bellows MD, MRCP(U.K) [address]  [place]  [place], [place] [address]  Main: [phone]  Fax: [phone]   Gastroenterology Consultation  Referring Provider:     Lloyd Huger, MD Primary Care Physician:  Kirk Ruths, MD Primary Gastroenterologist:  Dr. Jonathon Bellows  Reason for Consultation:     Diverticulitis        HPI:   Alex Underwood is a 74 y.o. y/o male referred by Dr. Grayland Ormond for diverticulitis.  He follows at the cancer center for stage IV neuroendocrine tumor with peritoneal metastasis.  Treated with chemotherapy in 2017.  In May 2021 he underwent a CT scan of the abdomen with contrast that demonstrated acute sigmoid diverticulitis.  He has since been referred to see [place] for evaluation and possibly colonoscopy.  Last colonoscopy on epic was in 2004 by Dr. Lucio Edward which demonstrated a 5 mm polyp in the cecum otherwise diverticulosis of the sigmoid colon was noted.  He denies any present or past history of off abdominal pain, diarrhea, rectal bleeding.  Past Medical History:  Diagnosis Date  . Depression    Since 04/12  . GERD (gastroesophageal reflux disease)    H/O  . 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 ear  . Hypertension   . Neuroendocrine cancer ([place])   . Prostate cancer ([place])   . Skin cancer, basal cell    forehead, R lat. forehead  . Sleep apnea    USES CPAP  . Squamous cell lung cancer [facility])     Past Surgical History:  Procedure Laterality Date  . COLONOSCOPY    . EYE SURGERY Bilateral    CATARACTS  . MOHS SURGERY  2009   forehead; [place], [employer] Right 07/21/2015   Procedure: PAROTIDECTOMY;  Surgeon: Margaretha Sheffield, MD;  Location: [facility] ORS;  Service: ENT;  Laterality: Right;  . PENILE PROSTHESIS IMPLANT    . PROSTATECTOMY    . TOE SURGERY      Prior to Admission medications   Medication Sig Start Date End Date Taking? Authorizing Provider  amLODipine (NORVASC) 5  MG tablet Take 5 mg by mouth every morning.     [provider]  aspirin 81 MG chewable tablet Chew 81 mg by mouth daily.     [provider]  desvenlafaxine (PRISTIQ) 50 MG 24 hr tablet Take 50 mg by mouth every morning.     [provider]  gabapentin (NEURONTIN) 100 MG capsule TAKE 1 CAPSULE(100 MG) BY MOUTH THREE TIMES DAILY 02/07/19   [provider]  testosterone (ANDRODERM) 4 MG/24HR PT24 patch Place 1 patch onto the skin daily.    [provider]  traZODone (DESYREL) 150 MG tablet Take by mouth at bedtime.    [provider]    Family History  Problem Relation Age of Onset  . Skin cancer Sister   . Skin cancer Brother      Social History   Tobacco Use  . Smoking status: Never Smoker  . Smokeless tobacco: Never Used  Substance Use Topics  . Alcohol use: No    Alcohol/week: 24.0 standard drinks    Types: 24 Cans of beer per week  . Drug use: No    Allergies as of 09/10/2019 - Review Complete 07/18/2019  Allergen Reaction Noted  . Codeine Other (See Comments) 01/30/2011  . Lamisil [terbinafine] Rash 07/15/2015  . Penicillins Rash 01/30/2011  . Terbinafine Rash 08/13/2015  Review of Systems:    All systems reviewed and negative except where noted in HPI.   Physical Exam:  There were no vitals taken for this visit. No LMP for male patient. Psych:  Alert and cooperative. Normal mood and affect. General:   Alert,  Well-developed, well-nourished, pleasant and cooperative in NAD Head:  Normocephalic and atraumatic. Eyes:  Sclera clear, no icterus.   Conjunctiva pink. Ears:  Normal auditory acuity. Lungs:  Respirations even and unlabored.  Clear throughout to auscultation.   No wheezes, crackles, or rhonchi. No acute distress. Heart:  Regular rate and rhythm; no murmurs, clicks, rubs, or gallops. Abdomen:  Normal bowel sounds.  No bruits.  Soft, non-tender and non-distended without masses, hepatosplenomegaly or  hernias noted.  No guarding or rebound tenderness.    Neurologic:  Alert and oriented x3;  grossly normal neurologically. Psych:  Alert and cooperative. Normal mood and affect.  Imaging Studies: No results found.  Assessment and Plan:   Alex Underwood is a 74 y.o. y/o male has been referred by Dr. Grayland Ormond for incidental finding of acute sigmoid diverticulitis on a CT scan in May 2021. At no point was asymptomatic. Last colonoscopy was in 2004 which demonstrated a cecal polyp and sigmoid diverticulitis.  He follows with the cancer center for neuroendocrine tumor with parotid metastasis.  Explained that it is important to rule out underlying colonic mass which at times can cause acute diverticulitis.  Did explain that most often no etiology is found for precipitating acute diverticulitis  Plan 1.  Diagnostic colonoscopy  I have discussed alternative options, risks & benefits,  which include, but are not limited to, bleeding, infection, perforation,respiratory complication & drug reaction.  The patient agrees with this plan & written consent will be obtained.     Follow up in as needed  Dr Jonathon Bellows MD,MRCP(U.K)

## 2019-09-18 ENCOUNTER — Other Ambulatory Visit
Admission: RE | Admit: 2019-09-18 | Discharge: 2019-09-18 | Disposition: A | Discharge status: Home / Self Care | Payer: Medicare Other | Source: Ambulatory Visit | Attending: Gastroenterology | Admitting: Gastroenterology

## 2019-09-18 ENCOUNTER — Other Ambulatory Visit: Payer: Self-pay

## 2019-09-18 DIAGNOSIS — Z20822 Contact with and (suspected) exposure to covid-19: Secondary | ICD-10-CM | POA: Diagnosis not present

## 2019-09-18 DIAGNOSIS — Z01812 Encounter for preprocedural laboratory examination: Secondary | ICD-10-CM | POA: Insufficient documentation

## 2019-09-18 LAB — SARS CORONAVIRUS 2 (TAT 6-24 HRS): SARS Coronavirus 2: NEGATIVE

## 2019-09-20 ENCOUNTER — Ambulatory Visit: Payer: Medicare Other | Admitting: Anesthesiology

## 2019-09-20 ENCOUNTER — Ambulatory Visit
Admission: RE | Admit: 2019-09-20 | Discharge: 2019-09-20 | Disposition: A | Discharge status: Home / Self Care | Payer: Medicare Other | Attending: Gastroenterology | Admitting: Gastroenterology

## 2019-09-20 ENCOUNTER — Encounter: Payer: Self-pay | Admitting: Gastroenterology

## 2019-09-20 ENCOUNTER — Encounter
Admission: RE | Disposition: A | Discharge status: Home / Self Care | Payer: Self-pay | Source: Home / Self Care | Attending: Gastroenterology

## 2019-09-20 ENCOUNTER — Other Ambulatory Visit: Payer: Self-pay

## 2019-09-20 DIAGNOSIS — I10 Essential (primary) hypertension: Secondary | ICD-10-CM | POA: Insufficient documentation

## 2019-09-20 DIAGNOSIS — Z85118 Personal history of other malignant neoplasm of bronchus and lung: Secondary | ICD-10-CM | POA: Diagnosis not present

## 2019-09-20 DIAGNOSIS — Z79899 Other long term (current) drug therapy: Secondary | ICD-10-CM | POA: Insufficient documentation

## 2019-09-20 DIAGNOSIS — K5732 Diverticulitis of large intestine without perforation or abscess without bleeding: Secondary | ICD-10-CM

## 2019-09-20 DIAGNOSIS — Z7982 Long term (current) use of aspirin: Secondary | ICD-10-CM | POA: Diagnosis not present

## 2019-09-20 DIAGNOSIS — Z85828 Personal history of other malignant neoplasm of skin: Secondary | ICD-10-CM | POA: Insufficient documentation

## 2019-09-20 DIAGNOSIS — G4730 Sleep apnea, unspecified: Secondary | ICD-10-CM | POA: Insufficient documentation

## 2019-09-20 DIAGNOSIS — F329 Major depressive disorder, single episode, unspecified: Secondary | ICD-10-CM | POA: Diagnosis not present

## 2019-09-20 DIAGNOSIS — Z8546 Personal history of malignant neoplasm of prostate: Secondary | ICD-10-CM | POA: Diagnosis not present

## 2019-09-20 DIAGNOSIS — Z09 Encounter for follow-up examination after completed treatment for conditions other than malignant neoplasm: Secondary | ICD-10-CM | POA: Insufficient documentation

## 2019-09-20 DIAGNOSIS — D125 Benign neoplasm of sigmoid colon: Secondary | ICD-10-CM | POA: Diagnosis not present

## 2019-09-20 DIAGNOSIS — D126 Benign neoplasm of colon, unspecified: Secondary | ICD-10-CM | POA: Diagnosis not present

## 2019-09-20 DIAGNOSIS — K5730 Diverticulosis of large intestine without perforation or abscess without bleeding: Secondary | ICD-10-CM | POA: Diagnosis not present

## 2019-09-20 DIAGNOSIS — D124 Benign neoplasm of descending colon: Secondary | ICD-10-CM | POA: Diagnosis not present

## 2019-09-20 HISTORY — PX: COLONOSCOPY WITH PROPOFOL: SHX5780

## 2019-09-20 SURGERY — COLONOSCOPY WITH PROPOFOL
Anesthesia: General

## 2019-09-20 MED ORDER — GLYCOPYRROLATE 0.2 MG/ML IJ SOLN
INTRAMUSCULAR | Status: DC | PRN
  Administered 2019-09-20: .2 mg via INTRAVENOUS

## 2019-09-20 MED ORDER — PROPOFOL 500 MG/50ML IV EMUL
INTRAVENOUS | Status: AC
  Filled 2019-09-20: qty 350

## 2019-09-20 MED ORDER — PROPOFOL 500 MG/50ML IV EMUL
INTRAVENOUS | Status: DC | PRN
  Administered 2019-09-20: 155 ug/kg/min via INTRAVENOUS

## 2019-09-20 MED ORDER — PROPOFOL 10 MG/ML IV BOLUS
INTRAVENOUS | Status: DC | PRN
  Administered 2019-09-20: 40 mg via INTRAVENOUS
  Administered 2019-09-20: 10 mg via INTRAVENOUS

## 2019-09-20 MED ORDER — GLYCOPYRROLATE 0.2 MG/ML IJ SOLN
INTRAMUSCULAR | Status: AC
  Filled 2019-09-20: qty 5

## 2019-09-20 MED ORDER — LIDOCAINE HCL (CARDIAC) PF 100 MG/5ML IV SOSY
PREFILLED_SYRINGE | INTRAVENOUS | Status: DC | PRN
  Administered 2019-09-20: 100 mg via INTRAVENOUS

## 2019-09-20 MED ORDER — LIDOCAINE HCL (PF) 2 % IJ SOLN
INTRAMUSCULAR | Status: AC
  Filled 2019-09-20: qty 25

## 2019-09-20 MED ORDER — PHENYLEPHRINE HCL (PRESSORS) 10 MG/ML IV SOLN
INTRAVENOUS | Status: AC
  Filled 2019-09-20: qty 1

## 2019-09-20 MED ORDER — SODIUM CHLORIDE 0.9 % IV SOLN: INTRAVENOUS | Status: DC

## 2019-09-20 NOTE — Anesthesia Preprocedure Evaluation (Signed)
Anesthesia Evaluation  Patient identified by MRN, date of birth, ID band Patient awake    Reviewed: Allergy & Precautions, NPO status , Patient's Chart, lab work & pertinent test results, reviewed documented beta blocker date and time   History of Anesthesia Complications Negative for: history of anesthetic complications  Airway Mallampati: III  TM Distance: >3 FB Neck ROM: Full    Dental  (+) Chipped, Missing, Partial Upper   Pulmonary sleep apnea and Continuous Positive Airway Pressure Ventilation ,    breath sounds clear to auscultation       Cardiovascular hypertension, Pt. on medications  Rhythm:Regular Rate:Normal - Systolic murmurs    Neuro/Psych PSYCHIATRIC DISORDERS Anxiety Depression    GI/Hepatic GERD  Controlled,  Endo/Other    Renal/GU      Musculoskeletal Hx radiation to neck/parotid   Abdominal   Peds  Hematology   Anesthesia Other Findings Past Medical History: No date: Depression     Comment:  Since 04/12 No date: GERD (gastroesophageal reflux disease)     Comment:  H/O 03/2012: Hx of squamous cell carcinoma of skin     Comment:  R. ear No date: Hypertension No date: Neuroendocrine cancer ([place]) No date: Prostate cancer ([place]) No date: Skin cancer, basal cell     Comment:  forehead, R lat. forehead No date: Sleep apnea     Comment:  USES CPAP No date: Squamous cell lung cancer ([facility])   Reproductive/Obstetrics                             Anesthesia Physical  Anesthesia Plan  ASA: III  Anesthesia Plan: General   Post-op Pain Management:    Induction: Intravenous  PONV Risk Score and Plan: 2 and Ondansetron, Propofol infusion and TIVA  Airway Management Planned: Natural Airway and Nasal Cannula  Additional Equipment: None  Intra-op Plan:   Post-operative Plan:   Informed Consent: I have reviewed the patients History and Physical, chart, labs and  discussed the procedure including the risks, benefits and alternatives for the proposed anesthesia with the patient or authorized representative who has indicated his/her understanding and acceptance.       Plan Discussed with: CRNA  Anesthesia Plan Comments: (Discussed risks of anesthesia with patient, including possibility of difficulty with spontaneous ventilation under anesthesia necessitating airway intervention, PONV, and rare risks such as cardiac or respiratory or neurological events. Patient understands.)        Anesthesia Quick Evaluation

## 2019-09-20 NOTE — Anesthesia Postprocedure Evaluation (Signed)
Anesthesia Post Note  Patient: Alex Underwood  Procedure(s) Performed: COLONOSCOPY WITH PROPOFOL (N/A )  Patient location during evaluation: Endoscopy Anesthesia Type: General Level of consciousness: awake and alert Pain management: pain level controlled Vital Signs Assessment: post-procedure vital signs reviewed and stable Respiratory status: spontaneous breathing, nonlabored ventilation, respiratory function stable and patient connected to nasal cannula oxygen Cardiovascular status: blood pressure returned to baseline and stable Postop Assessment: no apparent nausea or vomiting Anesthetic complications: no   No complications documented.   Last Vitals:  Vitals:   09/20/19 0847 09/20/19 0857  BP: 123/61 126/65  Pulse: 70 65  Resp: 17 20  Temp:    SpO2: 97% 97%    Last Pain:  Vitals:   09/20/19 0857  TempSrc:   PainSc: 0-No pain                 Arita Miss

## 2019-09-20 NOTE — Anesthesia Procedure Notes (Signed)
Procedure Name: General with mask airway Performed by: Kelton Pillar, CRNA Pre-anesthesia Checklist: Patient identified, Emergency Drugs available, Suction available and Patient being monitored Patient Re-evaluated:Patient Re-evaluated prior to induction Oxygen Delivery Method: Simple face mask Induction Type: IV induction Placement Confirmation: positive ETCO2 and CO2 detector

## 2019-09-20 NOTE — Op Note (Signed)
[facility] Gastroenterology Patient Name: Alex Underwood Procedure Date: 09/20/2019 7:34 AM MRN: [id number] Account #: [id number] Date of Birth: [birth year] Admit Type: Outpatient Age: 74 Room: [facility] ENDO ROOM 3 Gender: Male Note Status: Finalized Procedure:             Colonoscopy Indications:           Follow-up of diverticulitis Providers:             Jonathon Bellows MD, MD Medicines:             Monitored Anesthesia Care Complications:         No immediate complications. Procedure:             Pre-Anesthesia Assessment:                        - Prior to the procedure, a History and Physical was                         performed, and patient medications, allergies and                         sensitivities were reviewed. The patient's tolerance                         of previous anesthesia was reviewed.                        - The risks and benefits of the procedure and the                         sedation options and risks were discussed with the                         patient. All questions were answered and informed                         consent was obtained.                        - ASA Grade Assessment: III - A patient with severe                         systemic disease.                        After obtaining informed consent, the colonoscope was                         passed under direct vision. Throughout the procedure,                         the patient's blood pressure, pulse, and oxygen                         saturations were monitored continuously. The                         Colonoscope was introduced through the anus and  advanced to the the cecum, identified by the                         appendiceal orifice. The colonoscopy was performed                         with ease. The patient tolerated the procedure well.                         The quality of the bowel preparation was poor. Findings:      The perianal and digital  rectal examinations were normal.      Multiple small-mouthed diverticula were found in the sigmoid colon.      Two sessile polyps were found in the sigmoid colon and descending colon.       The polyps were 4 to 6 mm in size. These polyps were removed with a cold       snare. Resection and retrieval were complete.      The exam was otherwise without abnormality on direct and retroflexion       views. Impression:            - Preparation of the colon was poor.                        - Diverticulosis in the sigmoid colon.                        - Two 4 to 6 mm polyps in the sigmoid colon and in the                         descending colon, removed with a cold snare. Resected                         and retrieved.                        - The examination was otherwise normal on direct and                         retroflexion views. Recommendation:        - Discharge patient to home (with escort).                        - Resume previous diet.                        - Continue present medications.                        - Prep adequate to rule out large masses in the                         sigmoid colon and rest of colon , inadequate for colon                         polyp detection Procedure Code(s):     --- Professional ---                        [phone],  Colonoscopy, flexible; with removal of                         tumor(s), polyp(s), or other lesion(s) by snare                         technique Diagnosis Code(s):     --- Professional ---                        K63.5, Polyp of colon                        K57.32, Diverticulitis of large intestine without                         perforation or abscess without bleeding                        K57.30, Diverticulosis of large intestine without                         perforation or abscess without bleeding CPT copyright 2019 American Medical Association. All rights reserved. The codes documented in this report are preliminary and upon coder  review may  be revised to meet current compliance requirements. Jonathon Bellows, MD Jonathon Bellows MD, MD 09/20/2019 8:36:31 AM This report has been signed electronically. Number of Addenda: 0 Note Initiated On: 09/20/2019 7:34 AM Scope Withdrawal Time: 0 hours 10 minutes 10 seconds  Total Procedure Duration: 0 hours 14 minutes 33 seconds  Estimated Blood Loss:  Estimated blood loss: none.      [facility]

## 2019-09-20 NOTE — H&P (Signed)
Jonathon Bellows, MD [address], [place], [place], [place], [address] [address], [place], [place], [place], [address] Phone: [phone]  Fax: [phone]  Primary Care Physician:  Kirk Ruths, MD   Pre-Procedure History & Physical: HPI:  KEVON TENCH is a 74 y.o. male is here for an colonoscopy.   Past Medical History:  Diagnosis Date  . Depression    Since 04/12  . GERD (gastroesophageal reflux disease)    H/O  . 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 ear  . Hypertension   . Neuroendocrine cancer ([place])   . Prostate cancer ([place])   . Skin cancer, basal cell    forehead, R lat. forehead  . Sleep apnea    USES CPAP  . Squamous cell lung cancer [facility])     Past Surgical History:  Procedure Laterality Date  . COLONOSCOPY    . EYE SURGERY Bilateral    CATARACTS  . MOHS SURGERY  2009   forehead; [place], [employer] Right 07/21/2015   Procedure: PAROTIDECTOMY;  Surgeon: Margaretha Sheffield, MD;  Location: [facility] ORS;  Service: ENT;  Laterality: Right;  . PENILE PROSTHESIS IMPLANT    . PROSTATECTOMY    . TOE SURGERY      Prior to Admission medications   Medication Sig Start Date End Date Taking? Authorizing Provider  amLODipine (NORVASC) 5 MG tablet Take 5 mg by mouth every morning.    Yes [provider]  aspirin 81 MG chewable tablet Chew 81 mg by mouth daily.    Yes [provider]  desvenlafaxine (PRISTIQ) 50 MG 24 hr tablet Take 50 mg by mouth every morning.    Yes [provider]  gabapentin (NEURONTIN) 100 MG capsule TAKE 1 CAPSULE(100 MG) BY MOUTH THREE TIMES DAILY 02/07/19  Yes [provider]  testosterone (ANDRODERM) 4 MG/24HR PT24 patch Place 1 patch onto the skin daily.   Yes [provider]  traZODone (DESYREL) 150 MG tablet Take by mouth at bedtime.   Yes [provider]    Allergies as of 09/10/2019 - Review Complete 09/10/2019  Allergen Reaction  Noted  . Codeine Other (See Comments) 01/30/2011  . Lamisil [terbinafine] Rash 07/15/2015  . Penicillins Rash 01/30/2011  . Terbinafine Rash 08/13/2015    Family History  Problem Relation Age of Onset  . Skin cancer Sister   . Skin cancer Brother     Social History   Socioeconomic History  . Marital status: Married    Spouse name: Not on file  . Number of children: Not on file  . Years of education: Not on file  . Highest education level: Not on file  Occupational History  . Not on file  Tobacco Use  . Smoking status: Never Smoker  . Smokeless tobacco: Never Used  Vaping Use  . Vaping Use: Never used  Substance and Sexual Activity  . Alcohol use: No    Alcohol/week: 24.0 standard drinks    Types: 24 Cans of beer per week  . Drug use: No  . Sexual activity: Not on file  Other Topics Concern  . Not on file  Social History Narrative  . Not on file   Social Determinants of Health   Financial Resource Strain:   . Difficulty of Paying Living Expenses:   Food Insecurity:   . Worried About Charity fundraiser in the Last Year:   . [employer] of  Food in the Last Year:   Transportation Needs:   . Film/video editor (Medical):   Marland Kitchen Lack of Transportation (Non-Medical):   Physical Activity:   . Days of Exercise per Week:   . Minutes of Exercise per Session:   Stress:   . Feeling of Stress :   Social Connections:   . Frequency of Communication with Friends and Family:   . Frequency of Social Gatherings with Friends and Family:   . Attends Religious Services:   . Active Member of Clubs or Organizations:   . Attends Archivist Meetings:   Marland Kitchen Marital Status:   Intimate Partner Violence:   . Fear of Current or Ex-Partner:   . Emotionally Abused:   Marland Kitchen Physically Abused:   . Sexually Abused:     Review of Systems: See HPI, otherwise negative ROS  Physical Exam: BP 121/60   Pulse 61   Resp 16   Ht 5\' 9"  (1.753 m)   Wt 109.8 kg   BMI 35.74 kg/m    General:   Alert,  pleasant and cooperative in NAD Head:  Normocephalic and atraumatic. Neck:  Supple; no masses or thyromegaly. Lungs:  Clear throughout to auscultation, normal respiratory effort.    Heart:  +S1, +S2, Regular rate and rhythm, No edema. Abdomen:  Soft, nontender and nondistended. Normal bowel sounds, without guarding, and without rebound.   Neurologic:  Alert and  oriented x4;  grossly normal neurologically.  Impression/Plan: OSINACHI NAVARRETTE is here for an colonoscopy to be performed for evaluation of diverticulitis. Risks, benefits, limitations, and alternatives regarding  colonoscopy have been reviewed with the patient.  Questions have been answered.  All parties agreeable.   Jonathon Bellows, MD  09/20/2019, 8:15 AM

## 2019-09-20 NOTE — Transfer of Care (Signed)
Immediate Anesthesia Transfer of Care Note  Patient: EDRIK RUNDLE  Procedure(s) Performed: COLONOSCOPY WITH PROPOFOL (N/A )  Patient Location: Endoscopy Unit  Anesthesia Type:General  Level of Consciousness: drowsy and patient cooperative  Airway & Oxygen Therapy: Patient Spontanous Breathing and Patient connected to face mask oxygen  Post-op Assessment: Report given to RN and Post -op Vital signs reviewed and stable  Post vital signs: Reviewed and stable  Last Vitals:  Vitals Value Taken Time  BP    Temp    Pulse 68 09/20/19 0837  Resp 21 09/20/19 0837  SpO2 97 % 09/20/19 0837  Vitals shown include unvalidated device data.  Last Pain:  Vitals:   09/20/19 0728  TempSrc: Temporal  PainSc: 0-No pain         Complications: No complications documented.

## 2019-09-21 ENCOUNTER — Encounter: Payer: Self-pay | Admitting: Gastroenterology

## 2019-09-21 LAB — SURGICAL PATHOLOGY

## 2019-10-02 ENCOUNTER — Telehealth: Payer: Self-pay

## 2019-10-02 NOTE — Telephone Encounter (Signed)
-----   Message from Jonathon Bellows, MD sent at 09/27/2019  2:27 PM EDT ----- Inform patient that the polyps were precancerous i.e. adenomas.  The prep was inadequate for colon cancer screening and if the patient desires would need to reschedule a colonoscopy with a clear prep to screen for colon cancer

## 2019-10-02 NOTE — Telephone Encounter (Signed)
Called pt to inform of results and Dr. Anna's recommendations.  Unable to contact, LVM to return call 

## 2019-10-10 NOTE — Telephone Encounter (Signed)
Called and left a message for call back  

## 2019-10-16 NOTE — Telephone Encounter (Signed)
-----   Message from Jonathon Bellows, MD sent at 09/27/2019  2:27 PM EDT ----- Inform patient that the polyps were precancerous i.e. adenomas.  The prep was inadequate for colon cancer screening and if the patient desires would need to reschedule a colonoscopy with a clear prep to screen for colon cancer

## 2019-10-16 NOTE — Telephone Encounter (Signed)
Pt has been notified of results and Dr. Georgeann Oppenheim recommendations. Pt plans to contact our office when he's ready to schedule the repeat colonoscopy.

## 2019-12-24 ENCOUNTER — Ambulatory Visit: Payer: Medicare Other | Attending: Internal Medicine

## 2019-12-24 DIAGNOSIS — Z23 Encounter for immunization: Secondary | ICD-10-CM

## 2019-12-24 NOTE — Progress Notes (Signed)
   Covid-19 Vaccination Clinic  Name:  Alex Underwood    MRN: [id number] DOB: 11-Jan-1946  12/24/2019  Alex Underwood was observed post Covid-19 immunization for 15 minutes without incident. He was provided with Vaccine Information Sheet and instruction to access the V-Safe system.   Mr. Blank was instructed to call 911 with any severe reactions post vaccine: Marland Kitchen Difficulty breathing  . Swelling of face and throat  . A fast heartbeat  . A bad rash all over body  . Dizziness and weakness

## 2020-01-10 ENCOUNTER — Encounter: Payer: Self-pay | Admitting: Dermatology

## 2020-01-10 ENCOUNTER — Other Ambulatory Visit: Payer: Self-pay

## 2020-01-10 ENCOUNTER — Ambulatory Visit (INDEPENDENT_AMBULATORY_CARE_PROVIDER_SITE_OTHER): Payer: Medicare Other | Admitting: Dermatology

## 2020-01-10 DIAGNOSIS — Z1283 Encounter for screening for malignant neoplasm of skin: Secondary | ICD-10-CM

## 2020-01-10 DIAGNOSIS — L820 Inflamed seborrheic keratosis: Secondary | ICD-10-CM | POA: Diagnosis not present

## 2020-01-10 DIAGNOSIS — L814 Other melanin hyperpigmentation: Secondary | ICD-10-CM

## 2020-01-10 DIAGNOSIS — L821 Other seborrheic keratosis: Secondary | ICD-10-CM

## 2020-01-10 DIAGNOSIS — L570 Actinic keratosis: Secondary | ICD-10-CM | POA: Diagnosis not present

## 2020-01-10 DIAGNOSIS — D229 Melanocytic nevi, unspecified: Secondary | ICD-10-CM

## 2020-01-10 DIAGNOSIS — Z85828 Personal history of other malignant neoplasm of skin: Secondary | ICD-10-CM

## 2020-01-10 DIAGNOSIS — L578 Other skin changes due to chronic exposure to nonionizing radiation: Secondary | ICD-10-CM

## 2020-01-10 DIAGNOSIS — D1800 Hemangioma unspecified site: Secondary | ICD-10-CM

## 2020-01-10 NOTE — Progress Notes (Signed)
Follow-Up Visit   Subjective  Alex Underwood is a 74 y.o. male who presents for the following: Follow-up (AK follow up face and scalp treated with LN2 x 18. ISK follow up  face, scalp, arms and hands treated with LN2 x 10.). The patient presents for Upper Body Skin Exam (UBSE) for skin cancer screening and mole check.  The following portions of the chart were reviewed this encounter and updated as appropriate:  Tobacco  Allergies  Meds  Problems  Med Hx  Surg Hx  Fam Hx     Review of Systems:  No other skin or systemic complaints except as noted in HPI or Assessment and Plan.  Objective  Well appearing patient in no apparent distress; mood and affect are within normal limits.  All skin waist up examined.  Objective  Face (3): Erythematous thin papules/macules with gritty scale.   Objective  Scalp: Erythematous keratotic or waxy stuck-on papule or plaque.    Assessment & Plan    Lentigines - Scattered tan macules - Discussed due to sun exposure - Benign, observe - Call for any changes  Seborrheic Keratoses - Stuck-on, waxy, tan-brown papules and plaques  - Discussed benign etiology and prognosis. - Observe - Call for any changes  Melanocytic Nevi - Tan-brown and/or pink-flesh-colored symmetric macules and papules - Benign appearing on exam today - Observation - Call clinic for new or changing moles - Recommend daily use of broad spectrum spf 30+ sunscreen to sun-exposed areas.   Hemangiomas - Red papules - Discussed benign nature - Observe - Call for any changes  Actinic Damage - diffuse scaly erythematous macules with underlying dyspigmentation - Recommend daily broad spectrum sunscreen SPF 30+ to sun-exposed areas, reapply every 2 hours as needed.  - Call for new or changing lesions.  Skin cancer screening performed today.  History of Squamous Cell Carcinoma of the Skin - No evidence of recurrence today - No lymphadenopathy - Recommend regular  full body skin exams - Recommend daily broad spectrum sunscreen SPF 30+ to sun-exposed areas, reapply every 2 hours as needed.  - Call if any new or changing lesions are noted between office visits   History of Basal Cell Carcinoma of the Skin - No evidence of recurrence today - Recommend regular full body skin exams - Recommend daily broad spectrum sunscreen SPF 30+ to sun-exposed areas, reapply every 2 hours as needed.  - Call if any new or changing lesions are noted between office visits  AK (actinic keratosis) (3) Face  Destruction of lesion - Face Complexity: simple   Destruction method: cryotherapy   Informed consent: discussed and consent obtained   Timeout:  patient name, date of birth, surgical site, and procedure verified Lesion destroyed using liquid nitrogen: Yes   Region frozen until ice ball extended beyond lesion: Yes   Outcome: patient tolerated procedure well with no complications   Post-procedure details: wound care instructions given    Inflamed seborrheic keratosis Scalp  Discussed treating with Ln2 today - patient declines treating all but one lesion today.  Destruction of lesion - Scalp Complexity: simple   Destruction method: cryotherapy   Informed consent: discussed and consent obtained   Timeout:  patient name, date of birth, surgical site, and procedure verified Lesion destroyed using liquid nitrogen: Yes   Region frozen until ice ball extended beyond lesion: Yes   Outcome: patient tolerated procedure well with no complications   Post-procedure details: wound care instructions given    Skin cancer screening  Return in about 6 months (around 07/10/2020).   I, Ashok Cordia, CMA, am acting as scribe for Sarina Ser, MD .  Documentation: I have reviewed the above documentation for accuracy and completeness, and I agree with the above.  Sarina Ser, MD

## 2020-01-10 NOTE — Patient Instructions (Signed)

## 2020-07-14 ENCOUNTER — Other Ambulatory Visit: Payer: Self-pay

## 2020-07-14 ENCOUNTER — Ambulatory Visit (INDEPENDENT_AMBULATORY_CARE_PROVIDER_SITE_OTHER): Payer: Medicare Other | Admitting: Dermatology

## 2020-07-14 DIAGNOSIS — D229 Melanocytic nevi, unspecified: Secondary | ICD-10-CM

## 2020-07-14 DIAGNOSIS — L918 Other hypertrophic disorders of the skin: Secondary | ICD-10-CM

## 2020-07-14 DIAGNOSIS — L578 Other skin changes due to chronic exposure to nonionizing radiation: Secondary | ICD-10-CM | POA: Diagnosis not present

## 2020-07-14 DIAGNOSIS — L821 Other seborrheic keratosis: Secondary | ICD-10-CM

## 2020-07-14 DIAGNOSIS — Z1283 Encounter for screening for malignant neoplasm of skin: Secondary | ICD-10-CM | POA: Diagnosis not present

## 2020-07-14 DIAGNOSIS — D1800 Hemangioma unspecified site: Secondary | ICD-10-CM

## 2020-07-14 DIAGNOSIS — L570 Actinic keratosis: Secondary | ICD-10-CM | POA: Diagnosis not present

## 2020-07-14 DIAGNOSIS — L814 Other melanin hyperpigmentation: Secondary | ICD-10-CM | POA: Diagnosis not present

## 2020-07-14 NOTE — Progress Notes (Signed)
   Follow-Up Visit   Subjective  Alex Underwood is a 75 y.o. male who presents for the following: Actinic Keratosis (6 month follow up of face treated with LN2). The patient presents for Upper Body Skin Exam (UBSE) for skin cancer screening and mole check.  The following portions of the chart were reviewed this encounter and updated as appropriate:   Tobacco  Allergies  Meds  Problems  Med Hx  Surg Hx  Fam Hx     Review of Systems:  No other skin or systemic complaints except as noted in HPI or Assessment and Plan.  Objective  Well appearing patient in no apparent distress; mood and affect are within normal limits.  All skin waist up examined.  Objective  Scalp and nose (7): Erythematous thin papules/macules with gritty scale.    Assessment & Plan    Actinic Damage - chronic, secondary to cumulative UV radiation exposure/sun exposure over time - diffuse scaly erythematous macules with underlying dyspigmentation - Recommend daily broad spectrum sunscreen SPF 30+ to sun-exposed areas, reapply every 2 hours as needed.  - Recommend staying in the shade or wearing long sleeves, sun glasses (UVA+UVB protection) and wide brim hats (4-inch brim around the entire circumference of the hat). - Call for new or changing lesions.  Seborrheic Keratoses - Stuck-on, waxy, tan-brown papules and/or plaques  - Benign-appearing - Discussed benign etiology and prognosis. - Observe - Call for any changes  Lentigines - Scattered tan macules - Due to sun exposure - Benign-appering, observe - Recommend daily broad spectrum sunscreen SPF 30+ to sun-exposed areas, reapply every 2 hours as needed. - Call for any changes  Hemangiomas - Red papules - Discussed benign nature - Observe - Call for any changes  Melanocytic Nevi - Tan-brown and/or pink-flesh-colored symmetric macules and papules - Benign appearing on exam today - Observation - Call clinic for new or changing moles -  Recommend daily use of broad spectrum spf 30+ sunscreen to sun-exposed areas.   Acrochordons (Skin Tags) - Fleshy, skin-colored pedunculated papules - Benign appearing.  - Observe. - If desired, they can be removed with an in office procedure that is not covered by insurance. - Please call the clinic if you notice any new or changing lesions.  AK (actinic keratosis) (7) Scalp and nose  Destruction of lesion - Scalp and nose Complexity: simple   Destruction method: cryotherapy   Informed consent: discussed and consent obtained   Timeout:  patient name, date of birth, surgical site, and procedure verified Lesion destroyed using liquid nitrogen: Yes   Region frozen until ice ball extended beyond lesion: Yes   Outcome: patient tolerated procedure well with no complications   Post-procedure details: wound care instructions given    Return in about 6 months (around 01/14/2021) for AK follow up.   I, Ashok Cordia, CMA, am acting as scribe for Sarina Ser, MD .  Documentation: I have reviewed the above documentation for accuracy and completeness, and I agree with the above.  Sarina Ser, MD

## 2020-07-14 NOTE — Patient Instructions (Signed)

## 2020-07-16 ENCOUNTER — Encounter: Payer: Self-pay | Admitting: Dermatology

## 2020-07-18 ENCOUNTER — Inpatient Hospital Stay: Payer: Medicare Other | Attending: Oncology

## 2020-07-18 ENCOUNTER — Ambulatory Visit
Admission: RE | Admit: 2020-07-18 | Discharge: 2020-07-18 | Disposition: A | Discharge status: Home / Self Care | Payer: Medicare Other | Source: Ambulatory Visit | Attending: Oncology | Admitting: Oncology

## 2020-07-18 ENCOUNTER — Other Ambulatory Visit: Payer: Self-pay

## 2020-07-18 DIAGNOSIS — C7A8 Other malignant neuroendocrine tumors: Secondary | ICD-10-CM | POA: Insufficient documentation

## 2020-07-18 DIAGNOSIS — C7A1 Malignant poorly differentiated neuroendocrine tumors: Secondary | ICD-10-CM

## 2020-07-18 DIAGNOSIS — C7B8 Other secondary neuroendocrine tumors: Secondary | ICD-10-CM | POA: Diagnosis present

## 2020-07-18 LAB — COMPREHENSIVE METABOLIC PANEL WITH GFR
ALT: 35 U/L (ref 0–44)
AST: 29 U/L (ref 15–41)
Albumin: 4.3 g/dL (ref 3.5–5.0)
Alkaline Phosphatase: 41 U/L (ref 38–126)
Anion gap: 9 (ref 5–15)
BUN: 12 mg/dL (ref 8–23)
CO2: 29 mmol/L (ref 22–32)
Calcium: 9.1 mg/dL (ref 8.9–10.3)
Chloride: 95 mmol/L — ABNORMAL LOW (ref 98–111)
Creatinine, Ser: 1.05 mg/dL (ref 0.61–1.24)
GFR, Estimated: >60 mL/min (ref >60)
Glucose, Bld: 116 mg/dL — ABNORMAL HIGH (ref 70–99)
Potassium: 4.3 mmol/L (ref 3.5–5.1)
Sodium: 133 mmol/L — ABNORMAL LOW (ref 135–145)
Total Bilirubin: 1 mg/dL (ref 0.3–1.2)
Total Protein: 7.6 g/dL (ref 6.5–8.1)

## 2020-07-18 LAB — CBC WITH DIFFERENTIAL/PLATELET
Abs Immature Granulocytes: 0.01 K/uL (ref 0.00–0.07)
Basophils Absolute: 0 K/uL (ref 0.0–0.1)
Basophils Relative: 1 %
Eosinophils Absolute: 0.2 K/uL (ref 0.0–0.5)
Eosinophils Relative: 4 %
HCT: 41.8 % (ref 39.0–52.0)
Hemoglobin: 14.9 g/dL (ref 13.0–17.0)
Immature Granulocytes: 0 %
Lymphocytes Relative: 17 %
Lymphs Abs: 0.7 K/uL (ref 0.7–4.0)
MCH: 31.6 pg (ref 26.0–34.0)
MCHC: 35.6 g/dL (ref 30.0–36.0)
MCV: 88.6 fL (ref 80.0–100.0)
Monocytes Absolute: 0.5 K/uL (ref 0.1–1.0)
Monocytes Relative: 11 %
Neutro Abs: 3 K/uL (ref 1.7–7.7)
Neutrophils Relative %: 67 %
Platelets: 177 K/uL (ref 150–400)
RBC: 4.72 MIL/uL (ref 4.22–5.81)
RDW: 13 % (ref 11.5–15.5)
WBC: 4.4 K/uL (ref 4.0–10.5)
nRBC: 0 % (ref 0.0–0.2)

## 2020-07-18 MED ORDER — IOHEXOL 300 MG/ML  SOLN
100.0000 mL | Freq: Once | INTRAMUSCULAR | Status: AC | PRN
  Administered 2020-07-18: 100 mL via INTRAVENOUS

## 2020-07-21 ENCOUNTER — Encounter: Payer: Self-pay | Admitting: Oncology

## 2020-07-21 ENCOUNTER — Inpatient Hospital Stay (HOSPITAL_BASED_OUTPATIENT_CLINIC_OR_DEPARTMENT_OTHER): Payer: Medicare Other | Admitting: Oncology

## 2020-07-21 ENCOUNTER — Other Ambulatory Visit: Payer: Self-pay

## 2020-07-21 VITALS — BP 108/66 | HR 58 | Temp 97.4°F | Resp 20 | Wt 234.7 lb

## 2020-07-21 DIAGNOSIS — C7A1 Malignant poorly differentiated neuroendocrine tumors: Secondary | ICD-10-CM | POA: Diagnosis not present

## 2020-07-21 DIAGNOSIS — C7A8 Other malignant neuroendocrine tumors: Secondary | ICD-10-CM | POA: Diagnosis not present

## 2020-07-21 NOTE — Progress Notes (Signed)
Patient here today for follow up regarding neuroendocrine carcinoma. Patient denies any concerns today.

## 2020-07-21 NOTE — Progress Notes (Signed)
[facility]  [phone] [phone]  ID: Alex Underwood OB: 06/21/1945  MR#: [id number]  EXH#:[id number]  Patient Care Team: Kirk Ruths, MD as PCP - General (Internal Medicine) Lloyd Huger, MD as Consulting Physician (Oncology)  CHIEF COMPLAINT: Stage IV neuroendocrine tumor, consistent with small cell carcinoma with parotid metastasis.  INTERVAL HISTORY: Patient returns to clinic today for routine evaluation and discussion of his imaging results.  He was last seen in clinic on 07/19/2019.  Of the interim, he was seen by Dr. Vicente Males for acute diverticulitis.  He had a colonoscopy and EGD on 09/20/2019 showed diverticulosis in the sigmoid colon and two 4 to 6 mm polyps in the sigmoid colon and in descending colon.  Otherwise exam was unremarkable.   He was evaluated by Dr. Nehemiah Massed on 07/14/2020 for routine dermatology exam.  He has had several actinic keratosis removed from his face and upper body.  Today, patient is doing well and he remains asymptomatic.  He lost his wife in February 2022 after she fell and broke her hip.  States she was in failing health but he did not expect for her to pass away so quickly.  She contracted COVID about 4 months prior to her death and was severely malnourished.  They plan on having a memorial service for her at the end of the month in [place].  He states he has adjusted but is still very sad.  He denies any new symptoms or concerns.  REVIEW OF SYSTEMS:   Review of Systems  Constitutional: Negative.  Negative for fever, malaise/fatigue and weight loss.  HENT: Negative.  Negative for sore throat.   Respiratory: Negative.  Negative for cough, hemoptysis and shortness of breath.   Cardiovascular: Negative.  Negative for chest pain and leg swelling.  Gastrointestinal: Negative.  Negative for abdominal pain, blood in stool, constipation, diarrhea, melena, nausea and vomiting.  Genitourinary: Negative.    Musculoskeletal: Negative.   Skin: Negative.  Negative for rash.  Neurological: Negative for sensory change, focal weakness, weakness and headaches.  Psychiatric/Behavioral: Negative.  The patient is not nervous/anxious.     As per HPI. Otherwise, a complete review of systems is negative.  PAST MEDICAL HISTORY: Past Medical History:  Diagnosis Date  . Depression    Since 04/12  . GERD (gastroesophageal reflux disease)    H/O  . 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 ear  . Hypertension   . Neuroendocrine cancer ([place])   . Prostate cancer ([place])   . Skin cancer, basal cell 02/13/2015   forehead, R lat. forehead. Superficial and nodular patterns. Excised 03/04/2015, margins free.  . Sleep apnea    USES CPAP  . Squamous cell lung cancer ([place])     PAST SURGICAL HISTORY: Past Surgical History:  Procedure Laterality Date  . COLONOSCOPY    . COLONOSCOPY WITH PROPOFOL N/A 09/20/2019   Procedure: COLONOSCOPY WITH PROPOFOL;  Surgeon: Jonathon Bellows, MD;  Location: [facility] ENDOSCOPY;  Service: Gastroenterology;  Laterality: N/A;  . EYE SURGERY Bilateral    CATARACTS  . MOHS SURGERY  2009   forehead; [place], [employer] Right 07/21/2015   Procedure: PAROTIDECTOMY;  Surgeon: Margaretha Sheffield, MD;  Location: [facility] ORS;  Service: ENT;  Laterality: Right;  . PENILE PROSTHESIS IMPLANT    . PROSTATECTOMY    . TOE SURGERY      FAMILY HISTORY: Reviewed and unchanged. No reported history of malignancy or  chronic disease.     ADVANCED DIRECTIVES:    HEALTH MAINTENANCE: Social History   Tobacco Use  . Smoking status: Never Smoker  . Smokeless tobacco: Never Used  Vaping Use  . Vaping Use: Never used  Substance Use Topics  . Alcohol use: No    Alcohol/week: 24.0 standard drinks    Types: 24 Cans of beer per week  . Drug use: No     Colonoscopy:  PAP:  Bone density:  Lipid panel:  Allergies  Allergen Reactions  . Codeine Other (See  Comments)    Hallucinations.  . Lamisil [Terbinafine] Rash  . Penicillins Rash  . Terbinafine Rash    Current Outpatient Medications  Medication Sig Dispense Refill  . amLODipine (NORVASC) 5 MG tablet Take 5 mg by mouth every morning.     Marland Kitchen aspirin 81 MG chewable tablet Chew 81 mg by mouth daily.     Marland Kitchen desvenlafaxine (PRISTIQ) 50 MG 24 hr tablet Take 50 mg by mouth every morning.     . testosterone (ANDRODERM) 4 MG/24HR PT24 patch Place 1 patch onto the skin daily.    . traZODone (DESYREL) 150 MG tablet Take by mouth at bedtime.     No current facility-administered medications for this visit.    OBJECTIVE: Vitals:   07/21/20 1047  BP: 108/66  Pulse: (!) 58  Resp: 20  Temp: (!) 97.4 F (36.3 C)     Body mass index is 34.66 kg/m.    ECOG FS:0 - Asymptomatic  Physical Exam Constitutional:      Appearance: Normal appearance.  HENT:     Head: Normocephalic and atraumatic.  Eyes:     Pupils: Pupils are equal, round, and reactive to light.  Cardiovascular:     Rate and Rhythm: Normal rate and regular rhythm.     Heart sounds: Normal heart sounds. No murmur heard.   Pulmonary:     Effort: Pulmonary effort is normal.     Breath sounds: Normal breath sounds. No wheezing.  Abdominal:     General: Bowel sounds are normal. There is no distension.     Palpations: Abdomen is soft.     Tenderness: There is no abdominal tenderness.  Musculoskeletal:        General: Normal range of motion.     Cervical back: Normal range of motion.  Skin:    General: Skin is warm and dry.     Findings: No rash.  Neurological:     Mental Status: He is alert and oriented to person, place, and time.  Psychiatric:        Judgment: Judgment normal.     LAB RESULTS:  Lab Results  Component Value Date   NA 133 (L) 07/18/2020   K 4.3 07/18/2020   CL 95 (L) 07/18/2020   CO2 29 07/18/2020   GLUCOSE 116 (H) 07/18/2020   BUN 12 07/18/2020   CREATININE 1.05 07/18/2020   CALCIUM 9.1 07/18/2020    PROT 7.6 07/18/2020   ALBUMIN 4.3 07/18/2020   AST 29 07/18/2020   ALT 35 07/18/2020   ALKPHOS 41 07/18/2020   BILITOT 1.0 07/18/2020   GFRNONAA >60 07/18/2020   GFRAA >60 07/16/2019    Lab Results  Component Value Date   WBC 4.4 07/18/2020   NEUTROABS 3.0 07/18/2020   HGB 14.9 07/18/2020   HCT 41.8 07/18/2020   MCV 88.6 07/18/2020   PLT 177 07/18/2020     STUDIES: CT Abdomen Pelvis W Contrast  Result Date: 07/19/2020  CLINICAL DATA:  Metastatic neuroendocrine tumor, currently asymptomatic. Prostate cancer, status post prostatectomy. EXAM: CT ABDOMEN AND PELVIS WITH CONTRAST TECHNIQUE: Multidetector CT imaging of the abdomen and pelvis was performed using the standard protocol following bolus administration of intravenous contrast. CONTRAST:  153mL OMNIPAQUE IOHEXOL 300 MG/ML  SOLN COMPARISON:  07/16/2019 FINDINGS: Lower chest: Lung bases are clear. Hepatobiliary: 16 mm low-density lesion inferiorly in the right hepatic lobe (series image 34), unchanged. No suspicious/enhancing hepatic lesions. Numerous layering gallstones (series 2/image 26), without associated inflammatory changes. No intrahepatic or extrahepatic ductal dilatation. Pancreas: Within normal limits. Spleen: Within normal limits. Adrenals/Urinary Tract: Adrenal glands are within normal limits. Kidneys are within normal limits.  No hydronephrosis. Bladder is within normal limits. Stomach/Bowel: Stomach is within normal limits. No evidence of bowel obstruction. Metallic density with streak artifact in the posterior cecum (series 2/image 55), of unclear etiology. This does not have the normal appearance of a surgical clip and the patient does not have a history of endoscopic camera ingestion. Appendix is not discretely visualized. Sigmoid diverticulosis, without evidence of diverticulitis. Vascular/Lymphatic: No evidence of abdominal aortic aneurysm. Atherosclerotic calcifications of the abdominal aorta and branch vessels. 18 mm  short axis hypervascular node in the porta hepatis (series 2/image 22), unchanged, suspicious for nodal metastasis. 3.7 x 2.3 cm fluid density lesion in the right pelvic sidewall (series 2/image 35), likely reflecting a postoperative seroma versus lymphocele, unchanged. Reproductive: Status post prostatectomy. Penile prosthesis with reservoir in the right lower quadrant. Other: No abdominopelvic ascites. Musculoskeletal: Degenerative changes of the visualized thoracolumbar spine. IMPRESSION: 18 mm short axis hypervascular node in the porta hepatis, compatible with stable nodal metastasis in this patient with history of neuroendocrine tumor. Status post prostatectomy. No findings suspicious for recurrent or metastatic disease. Metallic foreign body in the posterior cecum, of unclear etiology. Correlate with procedural/surgical history. If this was not related to surgery/procedure, the foreign body is evident on the scout image and can be followed radiographically, as clinically warranted. Electronically Signed   By: Julian Hy M.D.   On: 07/19/2020 11:53    ASSESSMENT: Stage IV neuroendocrine tumor, consistent with small cell carcinoma with parotid metastasis.  PLAN:    1. Stage IV neuroendocrine tumor, consistent with small cell carcinoma with parotid metastasis:  -Completed 3 cycles of carbo/etoposide on 02/10/2016.  -Imaging from 07/16/2019 did not reveal any evidence of progressive or recurrent disease.  -Repeat imaging from 07/18/2020 shows no findings suspicious for recurrent or metastatic disease.  Stable 18 mm short axis hypervascular node in the porta hepatis-compatible with stable nodal metastasis in this patient and history of neuroendocrine tumor.  -Previous EUS biopsy of lesion revealed a low-grade neuroendocrine tumor slightly different than high-grade small cell carcinoma noted in the parotid gland.     -This was thought to be the primary lesion in the parotid lesion was a  metastasis.  -Continue simple observation.  -RTC yearly for imaging and follow-up.  2.  Acute diverticulitis:   -Status post colonoscopy and EGD   -Followed by GI.    -No further flares per patient.   Disposition: RTC in 1 year for imaging, lab work and MD assessment.  Greater than 50% was spent in counseling and coordination of care with this patient including but not limited to discussion of the relevant topics above (See A&P) including, but not limited to diagnosis and management of acute and chronic medical conditions.   Patient expressed understanding and was in agreement with this plan. He also understands that He  can call clinic at any time with any questions, concerns, or complaints.    Jacquelin Hawking, NP   07/21/2020 12:03 PM

## 2021-01-15 ENCOUNTER — Ambulatory Visit (INDEPENDENT_AMBULATORY_CARE_PROVIDER_SITE_OTHER): Payer: Medicare Other | Admitting: Dermatology

## 2021-01-15 ENCOUNTER — Other Ambulatory Visit: Payer: Self-pay

## 2021-01-15 DIAGNOSIS — L820 Inflamed seborrheic keratosis: Secondary | ICD-10-CM

## 2021-01-15 DIAGNOSIS — L578 Other skin changes due to chronic exposure to nonionizing radiation: Secondary | ICD-10-CM | POA: Diagnosis not present

## 2021-01-15 DIAGNOSIS — L570 Actinic keratosis: Secondary | ICD-10-CM

## 2021-01-15 NOTE — Progress Notes (Signed)
Follow-Up Visit   Subjective  Alex Underwood is a 75 y.o. male who presents for the following: Actinic Keratosis (Face, scalp, 44m f/u).  The following portions of the chart were reviewed this encounter and updated as appropriate:   Tobacco  Allergies  Meds  Problems  Med Hx  Surg Hx  Fam Hx     Review of Systems:  No other skin or systemic complaints except as noted in HPI or Assessment and Plan.  Objective  Well appearing patient in no apparent distress; mood and affect are within normal limits.  A focused examination was performed including face, scalp. Relevant physical exam findings are noted in the Assessment and Plan.  scalp, face x 30 (30) Pink scaly macules   scalp x 2, Total = 2 (2) Erythematous keratotic or waxy stuck-on papule or plaque.    Assessment & Plan   Actinic Damage - Severe, confluent actinic changes with pre-cancerous actinic keratoses  - Severe, chronic, not at goal, secondary to cumulative UV radiation exposure over time - diffuse scaly erythematous macules and papules with underlying dyspigmentation - Discussed Prescription "Field Treatment" for Severe, Chronic Confluent Actinic Changes with Pre-Cancerous Actinic Keratoses Field treatment involves treatment of an entire area of skin that has confluent Actinic Changes (Sun/ Ultraviolet light damage) and PreCancerous Actinic Keratoses by method of PhotoDynamic Therapy (PDT) and/or prescription Topical Chemotherapy agents such as 5-fluorouracil, 5-fluorouracil/calcipotriene, and/or imiquimod.  The purpose is to decrease the number of clinically evident and subclinical PreCancerous lesions to prevent progression to development of skin cancer by chemically destroying early precancer changes that may or may not be visible.  It has been shown to reduce the risk of developing skin cancer in the treated area. As a result of treatment, redness, scaling, crusting, and open sores may occur during treatment course.  One or more than one of these methods may be used and may have to be used several times to control, suppress and eliminate the PreCancerous changes. Discussed treatment course, expected reaction, and possible side effects. - Recommend daily broad spectrum sunscreen SPF 30+ to sun-exposed areas, reapply every 2 hours as needed.  - Staying in the shade or wearing long sleeves, sun glasses (UVA+UVB protection) and wide brim hats (4-inch brim around the entire circumference of the hat) are also recommended. - Call for new or changing lesions.  - Plan PDT to face in 1 month, then 2 weeks later PDT to scalp  AK (actinic keratosis) (30) scalp, face x 30  Destruction of lesion - scalp, face x 30 Complexity: simple   Destruction method: cryotherapy   Informed consent: discussed and consent obtained   Timeout:  patient name, date of birth, surgical site, and procedure verified Lesion destroyed using liquid nitrogen: Yes   Region frozen until ice ball extended beyond lesion: Yes   Outcome: patient tolerated procedure well with no complications   Post-procedure details: wound care instructions given    Inflamed seborrheic keratosis scalp x 2, Total = 2  Destruction of lesion - scalp x 2, Total = 2 Complexity: simple   Destruction method: cryotherapy   Informed consent: discussed and consent obtained   Timeout:  patient name, date of birth, surgical site, and procedure verified Lesion destroyed using liquid nitrogen: Yes   Region frozen until ice ball extended beyond lesion: Yes   Outcome: patient tolerated procedure well with no complications   Post-procedure details: wound care instructions given    Return for PDT face in 4 weeks, PDT  scalp 6wks, f/u Dr. Nehemiah Massed 3 months.  I, Othelia Pulling, RMA, am acting as scribe for Sarina Ser, MD . Documentation: I have reviewed the above documentation for accuracy and completeness, and I agree with the above.  Sarina Ser, MD

## 2021-01-15 NOTE — Patient Instructions (Addendum)
If you have any questions or concerns for your doctor, please call our main line at [phone] and press option 4 to reach your doctor's medical assistant. If no one answers, please leave a voicemail as directed and we will return your call as soon as possible. Messages left after 4 pm will be answered the following business day.   You may also send [place] a message via [facility]. We typically respond to MyChart messages within 1-2 business days.  For prescription refills, please ask your pharmacy to contact our office. Our fax number is [phone].  If you have an urgent issue when the clinic is closed that cannot wait until the next business day, you can page your doctor at the number below.    Please note that while we do our best to be available for urgent issues outside of office hours, we are not available 24/7.   If you have an urgent issue and are unable to reach [place], you may choose to seek medical care at your doctor's office, retail clinic, urgent care center, or emergency room.  If you have a medical emergency, please immediately call 911 or go to the emergency department.  Pager Numbers  - Dr. Nehemiah Massed: [phone]  - Dr. Laurence Ferrari: [phone]  - Dr. Nicole Kindred: [phone]  In the event of inclement weather, please call our main line at [phone] for an update on the status of any delays or closures.  Dermatology Medication Tips: Please keep the boxes that topical medications come in in order to help keep track of the instructions about where and how to use these. Pharmacies typically print the medication instructions only on the boxes and not directly on the medication tubes.   If your medication is too expensive, please contact our office at [phone] option 4 or send [place] a message through [facility].   We are unable to tell what your co-pay for medications will be in advance as this is different depending on your insurance coverage. However, we may be able to find a substitute  medication at lower cost or fill out paperwork to get insurance to cover a needed medication.   If a prior authorization is required to get your medication covered by your insurance company, please allow [place] 1-2 business days to complete this process.  Drug prices often vary depending on where the prescription is filled and some pharmacies may offer cheaper prices.  The website www.goodrx.com contains coupons for medications through different pharmacies. The prices here do not account for what the cost may be with help from insurance (it may be cheaper with your insurance), but the website can give you the price if you did not use any insurance.  - You can print the associated coupon and take it with your prescription to the pharmacy.  - You may also stop by our office during regular business hours and pick up a GoodRx coupon card.  - If you need your prescription sent electronically to a different pharmacy, notify our office through [facility] or by phone at [phone] option 4.  Photodynamic Therapy/Blue Light Therapy  Actinic keratoses are the dry, red scaly spots on the skin caused by sun damage. A portion of these spots can turn into skin cancer with time, and treating them can help prevent development of skin cancer.   Treatment of these spots requires removal of the defective skin cells. There are various ways to remove actinic keratoses, including freezing with liquid nitrogen, treatment with creams, or treatment with a  blue light procedure in the office.   Photodynamic Therapy (PDT), also known as "blue light therapy" is an in office procedure used to treat actinic keratoses. It works by targeting precancerous cells. After treatment, these cells peel off and are replaced by healthy ones.   For your phototherapy appointment, you will have two appointments on the day of your treatment. The first appointment will be to apply a cream to the treatment area. You will leave this cream  on for 1-2 hours depending on the area being treated. The second appointment will be to shine a blue light on the area for 16 minutes to kill off the precancer cells. It is common to experience a burning sensation during the treatment.  After your treatment, it will be important to keep the treated areas of skin out of the sun completely for 48-72 hours (2-3 days) to prevent having a reaction.   Common side effects include: - Burning or stinging, which may be severe and can last up to 24-72 hours after your treatment - Scaling and crusting which may last up to 2 weeks - Redness, swelling and/or peeling which can last up to 4 weeks  To Care for Your Skin After PDT/Blue Light Therapy: - Wash with soap, water and shampoo as normal. - If needed, you can use cold compresses (e.g. ice packs) for comfort - If okay with your primary care doctor, you may use analgesics such as acetaminophen (tylenol) every 4-6 hours, not to exceed recommended dose - You may apply Cerave Healing Ointment, Vaseline or Aquaphor as needed - If you have a lot of swelling you may take a Benadryl to help with this (this may cause drowsiness), not to exceed recommended dose. This may increase the risk of falls in people over 65 and may slow reaction time while driving, so it is not recommended to take before driving or operating machinery. - Sun Precautions - Wear a wide brim hat for the next week if outside  - Wear a sunblock with zinc or titanium dioxide at least SPF 50 daily  If you have any questions or concerns, please call the office and ask to speak with a nurse.  - Wear a sunblock with zinc or titanium dioxide at least SPF 50 daily  If you have any questions or concerns, please call the office and ask to speak with a nurse.   --------------------------------------------------------------------------------------------------------------   --------------------------------------------------------------------------------------------------------------

## 2021-01-16 ENCOUNTER — Encounter: Payer: Self-pay | Admitting: Dermatology

## 2021-02-23 ENCOUNTER — Ambulatory Visit (INDEPENDENT_AMBULATORY_CARE_PROVIDER_SITE_OTHER): Payer: Medicare Other

## 2021-02-23 ENCOUNTER — Other Ambulatory Visit: Payer: Self-pay

## 2021-02-23 DIAGNOSIS — L570 Actinic keratosis: Secondary | ICD-10-CM | POA: Diagnosis not present

## 2021-02-23 MED ORDER — AMINOLEVULINIC ACID HCL 20 % EX SOLR
1.0000 | Freq: Once | CUTANEOUS | Status: AC
Start: 2021-02-23 — End: 2021-02-23
  Administered 2021-02-23: 354 mg via TOPICAL

## 2021-02-23 NOTE — Patient Instructions (Signed)

## 2021-02-23 NOTE — Progress Notes (Signed)
Patient completed PDT therapy today.  1. AK (actinic keratosis) Scalp  Photodynamic therapy - Scalp Procedure discussed: discussed risks, benefits, side effects. and alternatives   Prep: site scrubbed/prepped with acetone   Location:  Scalp Number of lesions:  Multiple Type of treatment:  Blue light Aminolevulinic Acid (see MAR for details): Levulan Number of Levulan sticks used:  1 Incubation time (minutes):  120 Number of minutes under lamp:  16 Number of seconds under lamp:  40 Cooling:  Floor fan Outcome: patient tolerated procedure well with no complications   Post-procedure details: sunscreen applied    Aminolevulinic Acid HCl 20 % SOLR 354 mg - Scalp

## 2021-03-10 ENCOUNTER — Ambulatory Visit: Payer: Self-pay

## 2021-03-24 ENCOUNTER — Ambulatory Visit: Payer: Medicare Other

## 2021-05-05 ENCOUNTER — Ambulatory Visit: Payer: Self-pay | Admitting: Dermatology

## 2021-06-04 ENCOUNTER — Other Ambulatory Visit: Payer: Self-pay

## 2021-06-04 ENCOUNTER — Ambulatory Visit (INDEPENDENT_AMBULATORY_CARE_PROVIDER_SITE_OTHER): Payer: Medicare Other | Admitting: Dermatology

## 2021-06-04 DIAGNOSIS — L578 Other skin changes due to chronic exposure to nonionizing radiation: Secondary | ICD-10-CM | POA: Diagnosis not present

## 2021-06-04 DIAGNOSIS — L570 Actinic keratosis: Secondary | ICD-10-CM | POA: Diagnosis not present

## 2021-06-04 DIAGNOSIS — L814 Other melanin hyperpigmentation: Secondary | ICD-10-CM

## 2021-06-04 DIAGNOSIS — L821 Other seborrheic keratosis: Secondary | ICD-10-CM

## 2021-06-04 NOTE — Progress Notes (Signed)
? ?  Follow-Up Visit ?  ?Subjective  ?Alex Underwood is a 76 y.o. male who presents for the following: Actinic Keratosis (Patient here today for follow up on aks at scalp and face. Patient was treated with pdt in December at scalp. He reports his scalp got very red and inflamed. ). ?The patient has spots, moles and lesions to be evaluated, some may be new or changing and the patient has concerns that these could be cancer. ? ?The following portions of the chart were reviewed this encounter and updated as appropriate:  Tobacco  Allergies  Meds  Problems  Med Hx  Surg Hx  Fam Hx   ?  ?Review of Systems: No other skin or systemic complaints except as noted in HPI or Assessment and Plan. ? ?Objective  ?Well appearing patient in no apparent distress; mood and affect are within normal limits. ? ?A focused examination was performed including face, scalp, ears, arms. Relevant physical exam findings are noted in the Assessment and Plan. ? ?scalp, face, ears x 6 (6) ?Erythematous thin papules/macules with gritty scale.  ? ? ?Assessment & Plan  ?Actinic keratosis (6) ?scalp, face, ears x 6 ?Actinic keratoses are precancerous spots that appear secondary to cumulative UV radiation exposure/sun exposure over time. They are chronic with expected duration over 1 year. A portion of actinic keratoses will progress to squamous cell carcinoma of the skin. It is not possible to reliably predict which spots will progress to skin cancer and so treatment is recommended to prevent development of skin cancer. ? ?Recommend daily broad spectrum sunscreen SPF 30+ to sun-exposed areas, reapply every 2 hours as needed.  ?Recommend staying in the shade or wearing long sleeves, sun glasses (UVA+UVB protection) and wide brim hats (4-inch brim around the entire circumference of the hat). ?Call for new or changing lesions. ? ?Destruction of lesion - scalp, face, ears x 6 ?Complexity: simple   ?Destruction method: cryotherapy   ?Informed consent:  discussed and consent obtained   ?Timeout:  patient name, date of birth, surgical site, and procedure verified ?Lesion destroyed using liquid nitrogen: Yes   ?Region frozen until ice ball extended beyond lesion: Yes   ?Outcome: patient tolerated procedure well with no complications   ?Post-procedure details: wound care instructions given   ?Additional details:  Prior to procedure, discussed risks of blister formation, small wound, skin dyspigmentation, or rare scar following cryotherapy. Recommend Vaseline ointment to treated areas while healing. ? ?Lentigines ?- Scattered tan macules ?- Due to sun exposure ?- Benign-appering, observe ?- Recommend daily broad spectrum sunscreen SPF 30+ to sun-exposed areas, reapply every 2 hours as needed. ?- Call for any changes ? ?Seborrheic Keratoses ?- Stuck-on, waxy, tan-brown papules and/or plaques  ?- Benign-appearing ?- Discussed benign etiology and prognosis. ?- Observe ?- Call for any changes ? ?Actinic Damage ?- chronic, secondary to cumulative UV radiation exposure/sun exposure over time ?- diffuse scaly erythematous macules with underlying dyspigmentation ?- Recommend daily broad spectrum sunscreen SPF 30+ to sun-exposed areas, reapply every 2 hours as needed.  ?- Recommend staying in the shade or wearing long sleeves, sun glasses (UVA+UVB protection) and wide brim hats (4-inch brim around the entire circumference of the hat). ?- Call for new or changing lesions. ? ?Return for october tbse ak follow up. ?I, Ruthell Rummage, CMA, am acting as scribe for Sarina Ser, MD. ?Documentation: I have reviewed the above documentation for accuracy and completeness, and I agree with the above. ? ?Sarina Ser, MD ? ?

## 2021-06-04 NOTE — Patient Instructions (Addendum)
? ?Actinic keratoses are precancerous spots that appear secondary to cumulative UV radiation exposure/sun exposure over time. They are chronic with expected duration over 1 year. A portion of actinic keratoses will progress to squamous cell carcinoma of the skin. It is not possible to reliably predict which spots will progress to skin cancer and so treatment is recommended to prevent development of skin cancer. ? ?Recommend daily broad spectrum sunscreen SPF 30+ to sun-exposed areas, reapply every 2 hours as needed.  ?Recommend staying in the shade or wearing long sleeves, sun glasses (UVA+UVB protection) and wide brim hats (4-inch brim around the entire circumference of the hat). ?Call for new or changing lesions.  ? ?Cryotherapy Aftercare ? ?Wash gently with soap and water everyday.   ?Apply Vaseline and Band-Aid daily until healed.  ? ?Seborrheic Keratosis ? ?What causes seborrheic keratoses? ?Seborrheic keratoses are harmless, common skin growths that first appear during adult life.  As time goes by, more growths appear.  Some people may develop a large number of them.  Seborrheic keratoses appear on both covered and uncovered body parts.  They are not caused by sunlight.  The tendency to develop seborrheic keratoses can be inherited.  They vary in color from skin-colored to gray, brown, or even black.  They can be either smooth or have a rough, warty surface.   ?Seborrheic keratoses are superficial and look as if they were stuck on the skin.  Under the microscope this type of keratosis looks like layers upon layers of skin.  That is why at times the top layer may seem to fall off, but the rest of the growth remains and re-grows.   ? ?Treatment ?Seborrheic keratoses do not need to be treated, but can easily be removed in the office.  Seborrheic keratoses often cause symptoms when they rub on clothing or jewelry.  Lesions can be in the way of shaving.  If they become inflamed, they can cause itching, soreness,  or burning.  Removal of a seborrheic keratosis can be accomplished by freezing, burning, or surgery. ?If any spot bleeds, scabs, or grows rapidly, please return to have it checked, as these can be an indication of a skin cancer. ? ? ? ? ? ? ?If You Need Anything After Your Visit ? ?If you have any questions or concerns for your doctor, please call our main line at [phone] and press option 4 to reach your doctor's medical assistant. If no one answers, please leave a voicemail as directed and we will return your call as soon as possible. Messages left after 4 pm will be answered the following business day.  ? ?You may also send [place] a message via MyChart. We typically respond to MyChart messages within 1-2 business days. ? ?For prescription refills, please ask your pharmacy to contact our office. Our fax number is [phone]. ? ?If you have an urgent issue when the clinic is closed that cannot wait until the next business day, you can page your doctor at the number below.   ? ?Please note that while we do our best to be available for urgent issues outside of office hours, we are not available 24/7.  ? ?If you have an urgent issue and are unable to reach [place], you may choose to seek medical care at your doctor's office, retail clinic, urgent care center, or emergency room. ? ?If you have a medical emergency, please immediately call 911 or go to the emergency department. ? ?Pager Numbers ? ?- Dr. Nehemiah Massed: [phone] ? ?-  Dr. Laurence Ferrari: [phone] ? ?- Dr. Nicole Kindred: [phone] ? ?In the event of inclement weather, please call our main line at [phone] for an update on the status of any delays or closures. ? ?Dermatology Medication Tips: ?Please keep the boxes that topical medications come in in order to help keep track of the instructions about where and how to use these. Pharmacies typically print the medication instructions only on the boxes and not directly on the medication tubes.  ? ?If your medication is  too expensive, please contact our office at [phone] option 4 or send [place] a message through [place].  ? ?We are unable to tell what your co-pay for medications will be in advance as this is different depending on your insurance coverage. However, we may be able to find a substitute medication at lower cost or fill out paperwork to get insurance to cover a needed medication.  ? ?If a prior authorization is required to get your medication covered by your insurance company, please allow [place] 1-2 business days to complete this process. ? ?Drug prices often vary depending on where the prescription is filled and some pharmacies may offer cheaper prices. ? ?The website www.goodrx.com contains coupons for medications through different pharmacies. The prices here do not account for what the cost may be with help from insurance (it may be cheaper with your insurance), but the website can give you the price if you did not use any insurance.  ?- You can print the associated coupon and take it with your prescription to the pharmacy.  ?- You may also stop by our office during regular business hours and pick up a GoodRx coupon card.  ?- If you need your prescription sent electronically to a different pharmacy, notify our office through [facility] or by phone at [phone] option 4. ? ? ? ? ?Si Usted Necesita Algo Despu?s de Su Visita ? ?Tambi?n puede enviarnos un mensaje a trav?s de MyChart. Por lo general respondemos a los mensajes de MyChart en el transcurso de 1 a 2 d?as h?biles. ? ?Para renovar recetas, por favor pida a su farmacia que se ponga en contacto con nuestra oficina. Nuestro n?mero de fax es el [phone]. ? ?Si tiene un asunto urgente cuando la cl?nica est? cerrada y que no puede esperar hasta el siguiente d?a h?bil, puede llamar/localizar a su doctor(a) al n?mero que aparece a continuaci?n.  ? ?Por favor, tenga en cuenta que aunque hacemos todo lo posible para estar disponibles para asuntos urgentes  fuera del horario de oficina, no estamos disponibles las 24 horas del d?a, los 7 d?as de la semana.  ? ?Si tiene un problema urgente y no puede comunicarse con nosotros, puede optar por buscar atenci?n m?dica  en el consultorio de su doctor(a), en una cl?nica privada, en un centro de atenci?n urgente o en una sala de emergencias. ? ?Si tiene Engineer, maintenance (IT) m?dica, por favor llame inmediatamente al 911 o vaya a la sala de emergencias. ? ?N?meros de b?per ? ?- Dr. Nehemiah Massed: [phone] ? ?- Dra. Moye: [phone] ? ?- Dra. Nicole Kindred: [phone] ? ?En caso de inclemencias del tiempo, por favor llame a nuestra l?nea principal al [phone] para una actualizaci?n sobre el estado de cualquier retraso o cierre. ? ?Consejos para la medicaci?n en dermatolog?a: ?Por favor, guarde las cajas en las que vienen los medicamentos de uso t?pico para ayudarle a seguir las instrucciones sobre d?nde y c?mo usarlos. Las farmacias generalmente imprimen las instrucciones del medicamento s?lo en las cajas y  no directamente en los tubos del medicamento.  ? ?Si su medicamento es muy caro, por favor, p?ngase en contacto con Zigmund Daniel llamando al [phone] y presione la opci?n 4 o env?enos un mensaje a trav?s de MyChart.  ? ?No podemos decirle cu?l ser? su copago por los medicamentos por adelantado ya que esto es diferente dependiendo de la cobertura de su seguro. Sin embargo, es posible que podamos encontrar un medicamento sustituto a Electrical engineer un formulario para que el seguro cubra el medicamento que se considera necesario.  ? ?Si se requiere Ardelia Mems autorizaci?n previa para que su compa??a de seguros [place] su medicamento, por favor perm?tanos de 1 a 2 d?as h?biles para completar este proceso. ? ?Los precios de los medicamentos var?an con frecuencia dependiendo del Environmental consultant de d?nde se surte la receta y alguna farmacias pueden ofrecer precios m?s baratos. ? ?El sitio web www.goodrx.com tiene cupones para medicamentos de  Airline pilot. Los precios aqu? no tienen en cuenta lo que podr?a costar con la ayuda del seguro (puede ser m?s barato con su seguro), pero el sitio web puede darle el precio si no utiliz? ning?n segur

## 2021-06-07 ENCOUNTER — Encounter: Payer: Self-pay | Admitting: Dermatology

## 2021-07-18 NOTE — Progress Notes (Signed)
?[place]  ?[phone] [phone] ? ?ID: Alex Underwood OB: 1945-09-17  MR#: [id number]  HTD#:[id number] ? ?Patient Care Team: ?Kirk Ruths, MD as PCP - General (Internal Medicine) ?Lloyd Huger, MD as Consulting Physician (Oncology) ? ?CHIEF COMPLAINT: Stage IV neuroendocrine tumor, consistent with small cell carcinoma with parotid metastasis. ? ?INTERVAL HISTORY: Patient returns to clinic today for routine yearly evaluation and discussion of his imaging results.  He continues to feel well and remains asymptomatic.  He has no neurologic complaints. He denies any recent fevers or illnesses. He has a good appetite and denies weight loss.  He denies any chest pain, shortness of breath, cough, or hemoptysis.  He denies any abdominal pain.  He has no nausea, vomiting, constipation, or diarrhea.  He has no urinary complaints.  Patient offers no specific complaints today. ? ?REVIEW OF SYSTEMS:   ?Review of Systems  ?Constitutional: Negative.  Negative for fever, malaise/fatigue and weight loss.  ?HENT: Negative.  Negative for sore throat.   ?Respiratory: Negative.  Negative for cough, hemoptysis and shortness of breath.   ?Cardiovascular: Negative.  Negative for chest pain and leg swelling.  ?Gastrointestinal: Negative.  Negative for abdominal pain, blood in stool, constipation, diarrhea, melena, nausea and vomiting.  ?Genitourinary: Negative.   ?Musculoskeletal: Negative.   ?Skin: Negative.  Negative for rash.  ?Neurological:  Negative for sensory change, focal weakness, weakness and headaches.  ?Psychiatric/Behavioral: Negative.  The patient is not nervous/anxious.   ? ?As per HPI. Otherwise, a complete review of systems is negative. ? ?PAST MEDICAL HISTORY: ?Past Medical History:  ?Diagnosis Date  ? Depression   ? Since 04/12  ? GERD (gastroesophageal reflux disease)   ? H/O  ? 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 R. ear  ? Hypertension   ?  Neuroendocrine cancer ([place])   ? Prostate cancer ([place])   ? 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 forehead, R lat. forehead. Superficial and nodular patterns. Excised 03/04/2015, margins free.  ? Sleep apnea   ? USES CPAP  ? Squamous cell lung cancer ([place])   ? ? ?PAST SURGICAL HISTORY: ?Past Surgical History:  ?Procedure Laterality Date  ? COLONOSCOPY    ? COLONOSCOPY WITH PROPOFOL N/A 09/20/2019  ? Procedure: COLONOSCOPY WITH PROPOFOL;  Surgeon: Jonathon Bellows, MD;  Location: [facility] ENDOSCOPY;  Service: Gastroenterology;  Laterality: N/A;  ? EYE SURGERY Bilateral   ? CATARACTS  ? MOHS SURGERY  2009  ? forehead; [place], [facility]  ? PAROTIDECTOMY Right 07/21/2015  ? Procedure: PAROTIDECTOMY;  Surgeon: Margaretha Sheffield, MD;  Location: [facility] ORS;  Service: ENT;  Laterality: Right;  ? PENILE PROSTHESIS IMPLANT    ? PROSTATECTOMY    ? TOE SURGERY    ? ? ?FAMILY HISTORY: Reviewed and unchanged. No reported history of malignancy or chronic disease. ? ?  ? ADVANCED DIRECTIVES:  ? ? ?HEALTH MAINTENANCE: ?Social History  ? ?Tobacco Use  ? Smoking status: Never  ? Smokeless tobacco: Never  ?Vaping Use  ? Vaping Use: Never used  ?Substance Use Topics  ? Alcohol use: No  ?  Alcohol/week: 24.0 standard drinks  ?  Types: 24 Cans of beer per week  ? Drug use: No  ? ? ? Colonoscopy: ? PAP: ? Bone density: ? Lipid panel: ? ?Allergies  ?Allergen Reactions  ? Codeine Other (See Comments)  ?  Hallucinations.  ? Lamisil [Terbinafine] Rash  ? Penicillins Rash  ? Terbinafine Rash  ? ? ?  Current Outpatient Medications  ?Medication Sig Dispense Refill  ? amLODipine (NORVASC) 5 MG tablet Take 5 mg by mouth every morning.     ? aspirin 81 MG chewable tablet Chew 81 mg by mouth daily.     ? desvenlafaxine (PRISTIQ) 50 MG 24 hr tablet Take 50 mg by mouth every morning.     ? testosterone (ANDROGEL) 50 MG/5GM (1%) GEL Place 5 g onto the skin daily.    ? traZODone (DESYREL) 150 MG tablet Take by mouth at bedtime.    ? gabapentin (NEURONTIN) 100  MG capsule Take 100 mg by mouth 3 (three) times daily.    ? testosterone (ANDRODERM) 4 MG/24HR PT24 patch Place 1 patch onto the skin daily. (Patient not taking: Reported on 07/23/2021)    ? ?No current facility-administered medications for this visit.  ? ? ?OBJECTIVE: ?Vitals:  ? 07/23/21 1056  ?BP: (!) 149/62  ?Pulse: 61  ?Resp: 20  ?Temp: (!) 97.1 ?F (36.2 ?C)  ?SpO2: 99%  ?   Body mass index is 35.15 kg/m?Marland Kitchen    ECOG FS:0 - Asymptomatic ? ?General: Well-developed, well-nourished, no acute distress. ?Eyes: Pink conjunctiva, anicteric sclera. ?HEENT: Normocephalic, moist mucous membranes. ?Lungs: No audible wheezing or coughing. ?Heart: Regular rate and rhythm. ?Abdomen: Soft, nontender, no obvious distention. ?Musculoskeletal: No edema, cyanosis, or clubbing. ?Neuro: Alert, answering all questions appropriately. Cranial nerves grossly intact. ?Skin: No rashes or petechiae noted. ?Psych: Normal affect. ? ? ?LAB RESULTS: ? ?Lab Results  ?Component Value Date  ? NA 131 (L) 07/20/2021  ? K 4.2 07/20/2021  ? CL 96 (L) 07/20/2021  ? CO2 28 07/20/2021  ? GLUCOSE 126 (H) 07/20/2021  ? BUN 11 07/20/2021  ? CREATININE 1.04 07/20/2021  ? CALCIUM 9.3 07/20/2021  ? PROT 7.8 07/20/2021  ? ALBUMIN 4.4 07/20/2021  ? AST 25 07/20/2021  ? ALT 32 07/20/2021  ? ALKPHOS 54 07/20/2021  ? BILITOT 1.0 07/20/2021  ? GFRNONAA >60 07/20/2021  ? GFRAA >60 07/16/2019  ? ? ?Lab Results  ?Component Value Date  ? WBC 4.5 07/20/2021  ? NEUTROABS 2.9 07/20/2021  ? HGB 14.8 07/20/2021  ? HCT 41.2 07/20/2021  ? MCV 87.7 07/20/2021  ? PLT 202 07/20/2021  ? ? ? ?STUDIES: ?CT Abdomen Pelvis W Contrast ? ?Result Date: 07/20/2021 ?CLINICAL DATA:  Restaging Neuroendocrine CA diagnosed in 2017 with mets to Right Parotid Gland. He had a parotidectomy and chemo tx's in 2017. EXAM: CT ABDOMEN AND PELVIS WITH CONTRAST TECHNIQUE: Multidetector CT imaging of the abdomen and pelvis was performed using the standard protocol following bolus administration of  intravenous contrast. RADIATION DOSE REDUCTION: This exam was performed according to the departmental dose-optimization program which includes automated exposure control, adjustment of the mA and/or kV according to patient size and/or use of iterative reconstruction technique. CONTRAST:  172mL OMNIPAQUE IOHEXOL 300 MG/ML  SOLN COMPARISON:  CT Jul 20, 2021 FINDINGS: Lower chest: No acute abnormality. Mild symmetric distal esophageal wall thickening appears similar prior. Hepatobiliary: Unchanged 16 mm well-circumscribed hypodensity in segment VI consistent with a benign finding such as a cyst or hemangioma. No solid enhancing hepatic lesion. Cholelithiasis without findings of acute cholecystitis. No biliary ductal dilation. Pancreas: No pancreatic ductal dilation or evidence of acute inflammation. Spleen: No splenomegaly or focal splenic lesion. Adrenals/Urinary Tract: Bilateral adrenal glands appear normal. No hydronephrosis. Kidneys demonstrate symmetric enhancement and excretion of contrast material. Urinary bladder is minimally distended limiting evaluation. Stomach/Bowel: Radiopaque enteric contrast material traverses the hepatic flexure. Stomach is unremarkable  for degree of distension. No pathologic dilation of small or large bowel. The appendix and terminal ileum appear normal. Moderate volume of formed stool throughout the colon suggestive of constipation. Sigmoid colonic diverticulosis without findings of acute diverticulitis. Vascular/Lymphatic: Aortic and branch vessel atherosclerosis without abdominal aortic aneurysm. Unchanged size of the 18 mm short axis hypervascular lymph node in the porta hepatis on image 27/2. No enlarging or new pathologically enlarged abdominopelvic lymph nodes identified. Reproductive: Penile prosthesis with reservoir in the right anterior abdomen and pump in the right hemiscrotum. Prior prostatectomy. Other: No significant abdominopelvic free fluid. Stable cystic structure along  the right pelvic sidewall measuring 3.2 x 2.3 cm on image 80/2 likely reflecting a lymphocele. Musculoskeletal: Multilevel degenerative changes spine. Degenerative changes bilateral hips and SI joints with partial bony

## 2021-07-20 ENCOUNTER — Ambulatory Visit
Admission: RE | Admit: 2021-07-20 | Discharge: 2021-07-20 | Disposition: A | Discharge status: Home / Self Care | Payer: Medicare Other | Source: Ambulatory Visit | Attending: Oncology | Admitting: Oncology

## 2021-07-20 ENCOUNTER — Inpatient Hospital Stay: Payer: Medicare Other | Attending: Oncology

## 2021-07-20 ENCOUNTER — Other Ambulatory Visit: Payer: Self-pay

## 2021-07-20 DIAGNOSIS — C7A8 Other malignant neuroendocrine tumors: Secondary | ICD-10-CM | POA: Insufficient documentation

## 2021-07-20 DIAGNOSIS — C7B8 Other secondary neuroendocrine tumors: Secondary | ICD-10-CM | POA: Diagnosis present

## 2021-07-20 DIAGNOSIS — E871 Hypo-osmolality and hyponatremia: Secondary | ICD-10-CM | POA: Diagnosis not present

## 2021-07-20 DIAGNOSIS — C7A1 Malignant poorly differentiated neuroendocrine tumors: Secondary | ICD-10-CM

## 2021-07-20 LAB — COMPREHENSIVE METABOLIC PANEL
ALT: 32 U/L (ref 0–44)
AST: 25 U/L (ref 15–41)
Albumin: 4.4 g/dL (ref 3.5–5.0)
Alkaline Phosphatase: 54 U/L (ref 38–126)
Anion gap: 7 (ref 5–15)
BUN: 11 mg/dL (ref 8–23)
CO2: 28 mmol/L (ref 22–32)
Calcium: 9.3 mg/dL (ref 8.9–10.3)
Chloride: 96 mmol/L — ABNORMAL LOW (ref 98–111)
Creatinine, Ser: 1.04 mg/dL (ref 0.61–1.24)
GFR, Estimated: >60 mL/min (ref >60)
Glucose, Bld: 126 mg/dL — ABNORMAL HIGH (ref 70–99)
Potassium: 4.2 mmol/L (ref 3.5–5.1)
Sodium: 131 mmol/L — ABNORMAL LOW (ref 135–145)
Total Bilirubin: 1 mg/dL (ref 0.3–1.2)
Total Protein: 7.8 g/dL (ref 6.5–8.1)

## 2021-07-20 LAB — CBC WITH DIFFERENTIAL/PLATELET
Abs Immature Granulocytes: 0.01 10*3/uL (ref 0.00–0.07)
Basophils Absolute: 0 10*3/uL (ref 0.0–0.1)
Basophils Relative: 1 %
Eosinophils Absolute: 0.2 10*3/uL (ref 0.0–0.5)
Eosinophils Relative: 4 %
HCT: 41.2 % (ref 39.0–52.0)
Hemoglobin: 14.8 g/dL (ref 13.0–17.0)
Immature Granulocytes: 0 %
Lymphocytes Relative: 20 %
Lymphs Abs: 0.9 10*3/uL (ref 0.7–4.0)
MCH: 31.5 pg (ref 26.0–34.0)
MCHC: 35.9 g/dL (ref 30.0–36.0)
MCV: 87.7 fL (ref 80.0–100.0)
Monocytes Absolute: 0.4 10*3/uL (ref 0.1–1.0)
Monocytes Relative: 10 %
Neutro Abs: 2.9 10*3/uL (ref 1.7–7.7)
Neutrophils Relative %: 65 %
Platelets: 202 10*3/uL (ref 150–400)
RBC: 4.7 MIL/uL (ref 4.22–5.81)
RDW: 12.7 % (ref 11.5–15.5)
WBC: 4.5 10*3/uL (ref 4.0–10.5)
nRBC: 0 % (ref 0.0–0.2)

## 2021-07-20 MED ORDER — IOHEXOL 300 MG/ML  SOLN
100.0000 mL | Freq: Once | INTRAMUSCULAR | Status: AC | PRN
  Administered 2021-07-20: 100 mL via INTRAVENOUS

## 2021-07-23 ENCOUNTER — Inpatient Hospital Stay (HOSPITAL_BASED_OUTPATIENT_CLINIC_OR_DEPARTMENT_OTHER): Payer: Medicare Other | Admitting: Oncology

## 2021-07-23 VITALS — BP 149/62 | HR 61 | Temp 97.1°F | Resp 20 | Ht 69.0 in | Wt 238.0 lb

## 2021-07-23 DIAGNOSIS — C7A8 Other malignant neuroendocrine tumors: Secondary | ICD-10-CM | POA: Diagnosis not present

## 2021-07-23 DIAGNOSIS — C7A1 Malignant poorly differentiated neuroendocrine tumors: Secondary | ICD-10-CM

## 2021-12-31 ENCOUNTER — Ambulatory Visit: Payer: Medicare Other | Admitting: Dermatology

## 2022-03-22 ENCOUNTER — Ambulatory Visit (INDEPENDENT_AMBULATORY_CARE_PROVIDER_SITE_OTHER): Payer: Medicare Other | Admitting: Dermatology

## 2022-03-22 DIAGNOSIS — L918 Other hypertrophic disorders of the skin: Secondary | ICD-10-CM

## 2022-03-22 DIAGNOSIS — B353 Tinea pedis: Secondary | ICD-10-CM

## 2022-03-22 DIAGNOSIS — L570 Actinic keratosis: Secondary | ICD-10-CM

## 2022-03-22 DIAGNOSIS — L821 Other seborrheic keratosis: Secondary | ICD-10-CM

## 2022-03-22 DIAGNOSIS — L578 Other skin changes due to chronic exposure to nonionizing radiation: Secondary | ICD-10-CM | POA: Diagnosis not present

## 2022-03-22 DIAGNOSIS — L820 Inflamed seborrheic keratosis: Secondary | ICD-10-CM | POA: Diagnosis not present

## 2022-03-22 DIAGNOSIS — Z1283 Encounter for screening for malignant neoplasm of skin: Secondary | ICD-10-CM

## 2022-03-22 DIAGNOSIS — L814 Other melanin hyperpigmentation: Secondary | ICD-10-CM

## 2022-03-22 DIAGNOSIS — Z85828 Personal history of other malignant neoplasm of skin: Secondary | ICD-10-CM

## 2022-03-22 DIAGNOSIS — Z79899 Other long term (current) drug therapy: Secondary | ICD-10-CM

## 2022-03-22 DIAGNOSIS — D229 Melanocytic nevi, unspecified: Secondary | ICD-10-CM

## 2022-03-22 MED ORDER — KETOCONAZOLE 2 % EX CREA: 1.0000 | TOPICAL_CREAM | Freq: Every day | CUTANEOUS | 11 refills | Status: DC

## 2022-03-22 NOTE — Patient Instructions (Addendum)
Cryotherapy Aftercare  Wash gently with soap and water everyday.   Apply Vaseline and Band-Aid daily until healed.     Due to recent changes in healthcare laws, you may see results of your pathology and/or laboratory studies on MyChart before the doctors have had a chance to review them. We understand that in some cases there may be results that are confusing or concerning to you. Please understand that not all results are received at the same time and often the doctors may need to interpret multiple results in order to provide you with the best plan of care or course of treatment. Therefore, we ask that you please give us 2 business days to thoroughly review all your results before contacting the office for clarification. Should we see a critical lab result, you will be contacted sooner.   If You Need Anything After Your Visit  If you have any questions or concerns for your doctor, please call our main line at [phone] and press option 4 to reach your doctor's medical assistant. If no one answers, please leave a voicemail as directed and we will return your call as soon as possible. Messages left after 4 pm will be answered the following business day.   You may also send us a message via MyChart. We typically respond to MyChart messages within 1-2 business days.  For prescription refills, please ask your pharmacy to contact our office. Our fax number is [phone].  If you have an urgent issue when the clinic is closed that cannot wait until the next business day, you can page your doctor at the number below.    Please note that while we do our best to be available for urgent issues outside of office hours, we are not available 24/7.   If you have an urgent issue and are unable to reach us, you may choose to seek medical care at your doctor's office, retail clinic, urgent care center, or emergency room.  If you have a medical emergency, please immediately call 911 or go to the  emergency department.  Pager Numbers  - Dr. Kowalski: [phone]  - Dr. Moye: [phone]  - Dr. Stewart: [phone]  In the event of inclement weather, please call our main line at [phone] for an update on the status of any delays or closures.  Dermatology Medication Tips: Please keep the boxes that topical medications come in in order to help keep track of the instructions about where and how to use these. Pharmacies typically print the medication instructions only on the boxes and not directly on the medication tubes.   If your medication is too expensive, please contact our office at [phone] option 4 or send us a message through MyChart.   We are unable to tell what your co-pay for medications will be in advance as this is different depending on your insurance coverage. However, we may be able to find a substitute medication at lower cost or fill out paperwork to get insurance to cover a needed medication.   If a prior authorization is required to get your medication covered by your insurance company, please allow us 1-2 business days to complete this process.  Drug prices often vary depending on where the prescription is filled and some pharmacies may offer cheaper prices.  The website www.goodrx.com contains coupons for medications through different pharmacies. The prices here do not account for what the cost may be with help from insurance (it may be cheaper with your insurance), but the website can   give you the price if you did not use any insurance.  - You can print the associated coupon and take it with your prescription to the pharmacy.  - You may also stop by our office during regular business hours and pick up a GoodRx coupon card.  - If you need your prescription sent electronically to a different pharmacy, notify our office through [place] MyChart or by phone at [phone] option 4.     Si Usted Necesita Algo Despus de Su Visita  Tambin puede  enviarnos un mensaje a travs de MyChart. Por lo general respondemos a los mensajes de MyChart en el transcurso de 1 a 2 das hbiles.  Para renovar recetas, por favor pida a su farmacia que se ponga en contacto con nuestra oficina. Nuestro nmero de fax es el [phone].  Si tiene un asunto urgente cuando la clnica est cerrada y que no puede esperar hasta el siguiente da hbil, puede llamar/localizar a su doctor(a) al nmero que aparece a continuacin.   Por favor, tenga en cuenta que aunque hacemos todo lo posible para estar disponibles para asuntos urgentes fuera del horario de oficina, no estamos disponibles las 24 horas del da, los 7 das de la semana.   Si tiene un problema urgente y no puede comunicarse con nosotros, puede optar por buscar atencin mdica  en el consultorio de su doctor(a), en una clnica privada, en un centro de atencin urgente o en una sala de emergencias.  Si tiene una emergencia mdica, por favor llame inmediatamente al 911 o vaya a la sala de emergencias.  Nmeros de bper  - Dr. Kowalski: [phone]  - Dra. Moye: [phone]  - Dra. Stewart: [phone]  En caso de inclemencias del tiempo, por favor llame a nuestra lnea principal al [phone] para una actualizacin sobre el estado de cualquier retraso o cierre.  Consejos para la medicacin en dermatologa: Por favor, guarde las cajas en las que vienen los medicamentos de uso tpico para ayudarle a seguir las instrucciones sobre dnde y cmo usarlos. Las farmacias generalmente imprimen las instrucciones del medicamento slo en las cajas y no directamente en los tubos del medicamento.   Si su medicamento es muy caro, por favor, pngase en contacto con nuestra oficina llamando al [phone] y presione la opcin 4 o envenos un mensaje a travs de MyChart.   No podemos decirle cul ser su copago por los medicamentos por adelantado ya que esto es diferente dependiendo de la cobertura de su seguro.  Sin embargo, es posible que podamos encontrar un medicamento sustituto a menor costo o llenar un formulario para que el seguro cubra el medicamento que se considera necesario.   Si se requiere una autorizacin previa para que su compaa de seguros cubra su medicamento, por favor permtanos de 1 a 2 das hbiles para completar este proceso.  Los precios de los medicamentos varan con frecuencia dependiendo del lugar de dnde se surte la receta y alguna farmacias pueden ofrecer precios ms baratos.  El sitio web www.goodrx.com tiene cupones para medicamentos de diferentes farmacias. Los precios aqu no tienen en cuenta lo que podra costar con la ayuda del seguro (puede ser ms barato con su seguro), pero el sitio web puede darle el precio si no utiliz ningn seguro.  - Puede imprimir el cupn correspondiente y llevarlo con su receta a la farmacia.  - Tambin puede pasar por nuestra oficina durante el horario de atencin regular y recoger una tarjeta de cupones de GoodRx.  -   Si necesita que su receta se enve electrnicamente a una farmacia diferente, informe a nuestra oficina a travs de MyChart de [place] o por telfono llamando al [phone] y presione la opcin 4.  

## 2022-03-22 NOTE — Progress Notes (Unsigned)
Follow-Up Visit   Subjective  Alex Underwood is a 77 y.o. male who presents for the following: Annual Exam (History of BCC - The patient presents for Total-Body Skin Exam (TBSE) for skin cancer screening and mole check.  The patient has spots, moles and lesions to be evaluated, some may be new or changing and the patient has concerns that these could be cancer./).  The following portions of the chart were reviewed this encounter and updated as appropriate:   Tobacco  Allergies  Meds  Problems  Med Hx  Surg Hx  Fam Hx     Review of Systems:  No other skin or systemic complaints except as noted in HPI or Assessment and Plan.  Objective  Well appearing patient in no apparent distress; mood and affect are within normal limits.  A full examination was performed including scalp, head, eyes, ears, nose, lips, neck, chest, axillae, abdomen, back, buttocks, bilateral upper extremities, bilateral lower extremities, hands, feet, fingers, toes, fingernails, and toenails. All findings within normal limits unless otherwise noted below.  Scalp, face (16) Erythematous thin papules/macules with gritty scale.   Face (6) Erythematous stuck-on, waxy papule or plaque  Left Foot - Anterior Toenail dystrophy and scale    Assessment & Plan   History of Basal Cell Carcinoma of the Skin - No evidence of recurrence today - Recommend regular full body skin exams - Recommend daily broad spectrum sunscreen SPF 30+ to sun-exposed areas, reapply every 2 hours as needed.  - Call if any new or changing lesions are noted between office visits  Acrochordons (Skin Tags) - Fleshy, skin-colored pedunculated papules - Benign appearing.  - Observe. - If desired, they can be removed with an in office procedure that is not covered by insurance. - Please call the clinic if you notice any new or changing lesions.   Lentigines - Scattered tan macules - Due to sun exposure - Benign-appearing, observe -  Recommend daily broad spectrum sunscreen SPF 30+ to sun-exposed areas, reapply every 2 hours as needed. - Call for any changes  Seborrheic Keratoses - Stuck-on, waxy, tan-brown papules and/or plaques  - Benign-appearing - Discussed benign etiology and prognosis. - Observe - Call for any changes  Melanocytic Nevi - Tan-brown and/or pink-flesh-colored symmetric macules and papules - Benign appearing on exam today - Observation - Call clinic for new or changing moles - Recommend daily use of broad spectrum spf 30+ sunscreen to sun-exposed areas.   Hemangiomas - Red papules - Discussed benign nature - Observe - Call for any changes  Actinic Damage - Chronic condition, secondary to cumulative UV/sun exposure - diffuse scaly erythematous macules with underlying dyspigmentation - Recommend daily broad spectrum sunscreen SPF 30+ to sun-exposed areas, reapply every 2 hours as needed.  - Staying in the shade or wearing long sleeves, sun glasses (UVA+UVB protection) and wide brim hats (4-inch brim around the entire circumference of the hat) are also recommended for sun protection.  - Call for new or changing lesions.  Skin cancer screening performed today.  AK (actinic keratosis) (16) Scalp, face  Destruction of lesion - Scalp, face Complexity: simple   Destruction method: cryotherapy   Informed consent: discussed and consent obtained   Timeout:  patient name, date of birth, surgical site, and procedure verified Lesion destroyed using liquid nitrogen: Yes   Region frozen until ice ball extended beyond lesion: Yes   Outcome: patient tolerated procedure well with no complications   Post-procedure details: wound care instructions given  Inflamed seborrheic keratosis (6) Face  Destruction of lesion - Face Complexity: simple   Destruction method: cryotherapy   Informed consent: discussed and consent obtained   Timeout:  patient name, date of birth, surgical site, and procedure  verified Lesion destroyed using liquid nitrogen: Yes   Region frozen until ice ball extended beyond lesion: Yes   Outcome: patient tolerated procedure well with no complications   Post-procedure details: wound care instructions given    Tinea pedis of both feet Left Foot - Anterior Chronic and persistent condition with duration or expected duration over one year. Condition is symptomatic / bothersome to patient. Not to goal. Start Ketoconazole 2% cream qhs to feet and nails  ketoconazole (NIZORAL) 2 % cream - Left Foot - Anterior Apply 1 Application topically at bedtime. To feet and nails  Return in about 9 months (around 12/21/2022) for AK follow up, tinea follow up.  I, Ashok Cordia, CMA, am acting as scribe for Sarina Ser, MD . Documentation: I have reviewed the above documentation for accuracy and completeness, and I agree with the above.  Sarina Ser, MD

## 2022-03-23 ENCOUNTER — Encounter: Payer: Self-pay | Admitting: Dermatology

## 2022-07-19 ENCOUNTER — Inpatient Hospital Stay: Payer: Medicare Other | Attending: Oncology

## 2022-07-19 ENCOUNTER — Ambulatory Visit
Admission: RE | Admit: 2022-07-19 | Discharge: 2022-07-19 | Disposition: A | Discharge status: Home / Self Care | Payer: Medicare Other | Source: Ambulatory Visit | Attending: Oncology | Admitting: Oncology

## 2022-07-19 DIAGNOSIS — Z8589 Personal history of malignant neoplasm of other organs and systems: Secondary | ICD-10-CM | POA: Insufficient documentation

## 2022-07-19 DIAGNOSIS — C7A1 Malignant poorly differentiated neuroendocrine tumors: Secondary | ICD-10-CM

## 2022-07-19 DIAGNOSIS — Z79899 Other long term (current) drug therapy: Secondary | ICD-10-CM | POA: Diagnosis not present

## 2022-07-19 LAB — CBC WITH DIFFERENTIAL/PLATELET
Abs Immature Granulocytes: 0.01 10*3/uL (ref 0.00–0.07)
Basophils Absolute: 0 10*3/uL (ref 0.0–0.1)
Basophils Relative: 1 %
Eosinophils Absolute: 0.2 10*3/uL (ref 0.0–0.5)
Eosinophils Relative: 4 %
HCT: 39.7 % (ref 39.0–52.0)
Hemoglobin: 14.2 g/dL (ref 13.0–17.0)
Immature Granulocytes: 0 %
Lymphocytes Relative: 21 %
Lymphs Abs: 1.1 10*3/uL (ref 0.7–4.0)
MCH: 31.7 pg (ref 26.0–34.0)
MCHC: 35.8 g/dL (ref 30.0–36.0)
MCV: 88.6 fL (ref 80.0–100.0)
Monocytes Absolute: 0.5 10*3/uL (ref 0.1–1.0)
Monocytes Relative: 9 %
Neutro Abs: 3.3 10*3/uL (ref 1.7–7.7)
Neutrophils Relative %: 65 %
Platelets: 195 10*3/uL (ref 150–400)
RBC: 4.48 MIL/uL (ref 4.22–5.81)
RDW: 12.7 % (ref 11.5–15.5)
WBC: 5 10*3/uL (ref 4.0–10.5)
nRBC: 0 % (ref 0.0–0.2)

## 2022-07-19 LAB — COMPREHENSIVE METABOLIC PANEL
ALT: 47 U/L — ABNORMAL HIGH (ref 0–44)
AST: 36 U/L (ref 15–41)
Albumin: 4.4 g/dL (ref 3.5–5.0)
Alkaline Phosphatase: 46 U/L (ref 38–126)
Anion gap: 9 (ref 5–15)
BUN: 15 mg/dL (ref 8–23)
CO2: 25 mmol/L (ref 22–32)
Calcium: 9 mg/dL (ref 8.9–10.3)
Chloride: 101 mmol/L (ref 98–111)
Creatinine, Ser: 1.16 mg/dL (ref 0.61–1.24)
GFR, Estimated: >60 mL/min (ref >60)
Glucose, Bld: 133 mg/dL — ABNORMAL HIGH (ref 70–99)
Potassium: 4 mmol/L (ref 3.5–5.1)
Sodium: 135 mmol/L (ref 135–145)
Total Bilirubin: 0.7 mg/dL (ref 0.3–1.2)
Total Protein: 7.3 g/dL (ref 6.5–8.1)

## 2022-07-19 MED ORDER — IOHEXOL 300 MG/ML  SOLN
100.0000 mL | Freq: Once | INTRAMUSCULAR | Status: AC | PRN
  Administered 2022-07-19: 100 mL via INTRAVENOUS

## 2022-07-22 ENCOUNTER — Inpatient Hospital Stay (HOSPITAL_BASED_OUTPATIENT_CLINIC_OR_DEPARTMENT_OTHER): Payer: Medicare Other | Admitting: Oncology

## 2022-07-22 ENCOUNTER — Encounter: Payer: Self-pay | Admitting: Oncology

## 2022-07-22 VITALS — BP 105/49 | HR 68 | Temp 96.0°F | Resp 16 | Ht 69.0 in | Wt 238.2 lb

## 2022-07-22 DIAGNOSIS — C7A1 Malignant poorly differentiated neuroendocrine tumors: Secondary | ICD-10-CM

## 2022-07-22 DIAGNOSIS — Z8589 Personal history of malignant neoplasm of other organs and systems: Secondary | ICD-10-CM | POA: Diagnosis not present

## 2022-07-22 NOTE — Progress Notes (Signed)
[facility]  [phone] [phone]  ID: Alex Underwood OB: March 05, 1946  MR#: [id number]  YQI#:[id number]  Patient Care Team: Lauro Regulus, MD as PCP - General (Internal Medicine) Jeralyn Ruths, MD as Consulting Physician (Oncology)  CHIEF COMPLAINT: Stage IV neuroendocrine tumor, consistent with small cell carcinoma with parotid metastasis.  INTERVAL HISTORY: Patient returns to clinic today for routine yearly evaluation and discussion of his imaging results.  He continues to feel well and remains asymptomatic.  He has no neurologic complaints. He denies any recent fevers or illnesses. He has a good appetite and denies weight loss.  He denies any chest pain, shortness of breath, cough, or hemoptysis.  He denies any abdominal pain.  He has no nausea, vomiting, constipation, or diarrhea.  He has no urinary complaints.  Patient offers no specific complaints today.  REVIEW OF SYSTEMS:   Review of Systems  Constitutional: Negative.  Negative for fever, malaise/fatigue and weight loss.  HENT: Negative.  Negative for sore throat.   Respiratory: Negative.  Negative for cough, hemoptysis and shortness of breath.   Cardiovascular: Negative.  Negative for chest pain and leg swelling.  Gastrointestinal: Negative.  Negative for abdominal pain, blood in stool, constipation, diarrhea, melena, nausea and vomiting.  Genitourinary: Negative.   Musculoskeletal: Negative.   Skin: Negative.  Negative for rash.  Neurological:  Negative for sensory change, focal weakness, weakness and headaches.  Psychiatric/Behavioral: Negative.  The patient is not nervous/anxious.     As per HPI. Otherwise, a complete review of systems is negative.  PAST MEDICAL HISTORY: Past Medical History:  Diagnosis Date   Depression    Since 04/12   GERD (gastroesophageal reflux disease)    H/O   Hx of squamous cell carcinoma of skin 03/2012   R. ear   Hypertension     Neuroendocrine cancer (HCC)    Prostate cancer (HCC)    Skin cancer, basal cell 02/13/2015   forehead, R lat. forehead. Superficial and nodular patterns. Excised 03/04/2015, margins free.   Sleep apnea    USES CPAP   Squamous cell lung cancer (HCC)     PAST SURGICAL HISTORY: Past Surgical History:  Procedure Laterality Date   COLONOSCOPY     COLONOSCOPY WITH PROPOFOL N/A 09/20/2019   Procedure: COLONOSCOPY WITH PROPOFOL;  Surgeon: Wyline Mood, MD;  [facility] ENDOSCOPY;  Service: Gastroenterology;  Laterality: N/A;   EYE SURGERY Bilateral    CATARACTS   MOHS SURGERY  2009   forehead; [facility]CC, [facility]   PAROTIDECTOMY Right 07/21/2015   Procedure: PAROTIDECTOMY;  Surgeon: Vernie Murders, MD;  Location: [facility] ORS;  Service: ENT;  Laterality: Right;   PENILE PROSTHESIS IMPLANT     PROSTATECTOMY     TOE SURGERY      FAMILY HISTORY: Reviewed and unchanged. No reported history of malignancy or chronic disease.     ADVANCED DIRECTIVES:    HEALTH MAINTENANCE: Social History   Tobacco Use   Smoking status: Never   Smokeless tobacco: Never  Vaping Use   Vaping Use: Never used  Substance Use Topics   Alcohol use: No    Alcohol/week: 24.0 standard drinks of alcohol    Types: 24 Cans of beer per week   Drug use: No     Colonoscopy:  PAP:  Bone density:  Lipid panel:  Allergies  Allergen Reactions   Codeine Other (See Comments)    Hallucinations.   Lamisil [Terbinafine] Rash   Penicillins Rash  Terbinafine Rash    Current Outpatient Medications  Medication Sig Dispense Refill   amLODipine (NORVASC) 5 MG tablet Take 5 mg by mouth every morning.      aspirin 81 MG chewable tablet Chew 81 mg by mouth daily.      desvenlafaxine (PRISTIQ) 50 MG 24 hr tablet Take 50 mg by mouth every morning.      ketoconazole (NIZORAL) 2 % cream Apply 1 Application topically at bedtime. To feet and nails 60 g 11   testosterone (ANDROGEL) 50 MG/5GM (1%) GEL Place 5 g  onto the skin daily.     traZODone (DESYREL) 150 MG tablet Take by mouth at bedtime.     gabapentin (NEURONTIN) 100 MG capsule Take 100 mg by mouth 3 (three) times daily. (Patient not taking: Reported on 07/22/2022)     testosterone (ANDRODERM) 4 MG/24HR PT24 patch Place 1 patch onto the skin daily. (Patient not taking: Reported on 07/23/2021)     No current facility-administered medications for this visit.    OBJECTIVE: Vitals:   07/22/22 0927  BP: (!) 105/49  Pulse: 68  Resp: 16  Temp: (!) 96 F (35.6 C)  SpO2: 99%     Body mass index is 35.18 kg/m.    ECOG FS:0 - Asymptomatic  General: Well-developed, well-nourished, no acute distress. Eyes: Pink conjunctiva, anicteric sclera. HEENT: Normocephalic, moist mucous membranes. Lungs: No audible wheezing or coughing. Heart: Regular rate and rhythm. Abdomen: Soft, nontender, no obvious distention. Musculoskeletal: No edema, cyanosis, or clubbing. Neuro: Alert, answering all questions appropriately. Cranial nerves grossly intact. Skin: No rashes or petechiae noted. Psych: Normal affect.   LAB RESULTS:  Lab Results  Component Value Date   NA 135 07/19/2022   K 4.0 07/19/2022   CL 101 07/19/2022   CO2 25 07/19/2022   GLUCOSE 133 (H) 07/19/2022   BUN 15 07/19/2022   CREATININE 1.16 07/19/2022   CALCIUM 9.0 07/19/2022   PROT 7.3 07/19/2022   ALBUMIN 4.4 07/19/2022   AST 36 07/19/2022   ALT 47 (H) 07/19/2022   ALKPHOS 46 07/19/2022   BILITOT 0.7 07/19/2022   GFRNONAA >60 07/19/2022   GFRAA >60 07/16/2019    Lab Results  Component Value Date   WBC 5.0 07/19/2022   NEUTROABS 3.3 07/19/2022   HGB 14.2 07/19/2022   HCT 39.7 07/19/2022   MCV 88.6 07/19/2022   PLT 195 07/19/2022     STUDIES: CT Abdomen Pelvis W Contrast  Result Date: 07/22/2022 CLINICAL DATA:  Metastatic neuroendocrine tumor, status post chemotherapy EXAM: CT ABDOMEN AND PELVIS WITH CONTRAST TECHNIQUE: Multidetector CT imaging of the abdomen and pelvis  was performed using the standard protocol following bolus administration of intravenous contrast. RADIATION DOSE REDUCTION: This exam was performed according to the departmental dose-optimization program which includes automated exposure control, adjustment of the mA and/or kV according to patient size and/or use of iterative reconstruction technique. CONTRAST:  OMNIPAQUE IOHEXOL 300 MG/ML  SOLN COMPARISON:  07/20/2021 FINDINGS: Lower chest: Lung bases are clear. Hepatobiliary: 17 mm cyst inferiorly in the right hepatic lobe (series 2/image 33), benign. No suspicious/enhancing hepatic lesions. a numerous layering gallstones (series 2/image 26), without associated inflammatory changes. No intrahepatic or extrahepatic ductal dilatation. Pancreas: Within normal limits. Spleen: Within normal limits. Adrenals/Urinary Tract: Adrenal glands are within normal limits. Kidneys are within normal limits.  No hydronephrosis. Bladder is within normal limits. Stomach/Bowel: Stomach is within normal limits. No evidence of bowel obstruction. Normal appendix (series 2/image 67). Sigmoid diverticulosis, without evidence of  diverticulitis. Vascular/Lymphatic: No evidence of abdominal aortic aneurysm. Atherosclerotic calcifications of the abdominal aorta and branch vessels. 18 mm short axis node in the porta hepatis (series 2/image 20), suspicious for nodal metastasis in the setting of known neuroendocrine tumor, grossly unchanged. Reproductive: Status post prostatectomy. Penile prosthesis with reservoir in the right lower quadrant. Other: No abdominopelvic ascites. Musculoskeletal: Degenerative changes of the visualized thoracolumbar spine. IMPRESSION: 18 mm short axis node in the porta hepatis, suspicious for nodal metastasis in the setting of known neuroendocrine tumor, grossly unchanged. Status post prostatectomy. No evidence of recurrent or metastatic disease. Cholelithiasis, without associated inflammatory changes.  Electronically Signed   By: Charline Bills M.D.   On: 07/22/2022 00:11     ASSESSMENT: Stage IV neuroendocrine tumor, consistent with small cell carcinoma with parotid metastasis.  PLAN:    1. Stage IV neuroendocrine tumor, consistent with small cell carcinoma with parotid metastasis: Patient completed 3 cycles of carboplatinum and etoposide on October 10, 2015.  CT scan results from Jul 22, 2022 reviewed independently and report as above with stable and unchanged porta hepatis lymph node for the past several years otherwise no evidence of recurrent or metastatic disease. Previously, EUS biopsy of this lesion revealed a low-grade neuroendocrine tumor slightly different than the high-grade/small cell carcinoma noted in his parotid gland. This was thought to be the primary lesion and the parotid lesion was metastasis.  Previously, MRI the brain reviewed independently without metastatic disease.  Continue simple observation with primary care.  No further follow-up is necessary in the cancer center.  I spent a total of 20 minutes reviewing chart data, face-to-face evaluation with the patient, counseling and coordination of care as detailed above.    Patient expressed understanding and was in agreement with this plan. He also understands that He can call clinic at any time with any questions, concerns, or complaints.    Jeralyn Ruths, MD   07/22/2022 10:52 AM

## 2022-12-22 ENCOUNTER — Encounter: Payer: Self-pay | Admitting: Dermatology

## 2022-12-22 ENCOUNTER — Ambulatory Visit: Payer: Medicare Other | Admitting: Dermatology

## 2022-12-22 DIAGNOSIS — L820 Inflamed seborrheic keratosis: Secondary | ICD-10-CM

## 2022-12-22 DIAGNOSIS — L821 Other seborrheic keratosis: Secondary | ICD-10-CM | POA: Diagnosis not present

## 2022-12-22 DIAGNOSIS — L570 Actinic keratosis: Secondary | ICD-10-CM | POA: Diagnosis not present

## 2022-12-22 DIAGNOSIS — W098XXA Fall on or from other playground equipment, initial encounter: Secondary | ICD-10-CM | POA: Diagnosis not present

## 2022-12-22 DIAGNOSIS — L578 Other skin changes due to chronic exposure to nonionizing radiation: Secondary | ICD-10-CM | POA: Diagnosis not present

## 2022-12-22 NOTE — Progress Notes (Signed)
Follow-Up Visit   Subjective  Alex Underwood is a 77 y.o. male who presents for the following: 9 month AK follow up. Scalp and face. 16 lesions treated at last visit.  ISKs on face, Tx with LN2 at last visit. Areas on back are irritated, itching, rubbed by clothing.   The patient has spots, moles and lesions to be evaluated, some may be new or changing and the patient may have concern these could be cancer.   The following portions of the chart were reviewed this encounter and updated as appropriate: medications, allergies, medical history  Review of Systems:  No other skin or systemic complaints except as noted in HPI or Assessment and Plan.  Objective  Well appearing patient in no apparent distress; mood and affect are within normal limits.  A focused examination was performed of the following areas: Scalp, face, ears, neck, back  Relevant exam findings are noted in the Assessment and Plan.  scalp and face x15 (15) Erythematous thin papules/macules with gritty scale.   Scalp x2, back x16 (18) Erythematous keratotic or waxy stuck-on papule or plaque.    Assessment & Plan   ACTINIC DAMAGE - chronic, secondary to cumulative UV radiation exposure/sun exposure over time - diffuse scaly erythematous macules with underlying dyspigmentation - Recommend daily broad spectrum sunscreen SPF 30+ to sun-exposed areas, reapply every 2 hours as needed.  - Recommend staying in the shade or wearing long sleeves, sun glasses (UVA+UVB protection) and wide brim hats (4-inch brim around the entire circumference of the hat). - Call for new or changing lesions.  SEBORRHEIC KERATOSIS - Stuck-on, waxy, tan-brown papules and/or plaques  - Benign-appearing - Discussed benign etiology and prognosis. - Observe - Call for any changes   AK (actinic keratosis) (15) scalp and face x15  Actinic keratoses are precancerous spots that appear secondary to cumulative UV radiation exposure/sun exposure  over time. They are chronic with expected duration over 1 year. A portion of actinic keratoses will progress to squamous cell carcinoma of the skin. It is not possible to reliably predict which spots will progress to skin cancer and so treatment is recommended to prevent development of skin cancer.  Recommend daily broad spectrum sunscreen SPF 30+ to sun-exposed areas, reapply every 2 hours as needed.  Recommend staying in the shade or wearing long sleeves, sun glasses (UVA+UVB protection) and wide brim hats (4-inch brim around the entire circumference of the hat). Call for new or changing lesions.  Destruction of lesion - scalp and face x15 (15) Complexity: simple   Destruction method: cryotherapy   Informed consent: discussed and consent obtained   Timeout:  patient name, date of birth, surgical site, and procedure verified Lesion destroyed using liquid nitrogen: Yes   Region frozen until ice ball extended beyond lesion: Yes   Outcome: patient tolerated procedure well with no complications   Post-procedure details: wound care instructions given   Additional details:  Prior to procedure, discussed risks of blister formation, small wound, skin dyspigmentation, or rare scar following cryotherapy. Recommend Vaseline ointment to treated areas while healing.   Inflamed seborrheic keratosis (18) Scalp x2, back x16  Symptomatic, irritating, patient would like treated.  Destruction of lesion - Scalp x2, back x16 (18) Complexity: simple   Destruction method: cryotherapy   Informed consent: discussed and consent obtained   Timeout:  patient name, date of birth, surgical site, and procedure verified Lesion destroyed using liquid nitrogen: Yes   Region frozen until ice ball extended beyond lesion:  Yes   Outcome: patient tolerated procedure well with no complications   Post-procedure details: wound care instructions given   Additional details:  Prior to procedure, discussed risks of blister  formation, small wound, skin dyspigmentation, or rare scar following cryotherapy. Recommend Vaseline ointment to treated areas while healing.     Return in about 6 months (around 06/22/2023) for TBSE, AK Follow Up, ISK Follow Up.  I, Lawson Radar, CMA, am acting as scribe for Armida Sans, MD.   Documentation: I have reviewed the above documentation for accuracy and completeness, and I agree with the above.  Armida Sans, MD

## 2022-12-22 NOTE — Patient Instructions (Addendum)

## 2022-12-28 ENCOUNTER — Encounter: Payer: Self-pay | Admitting: Dermatology

## 2023-05-19 ENCOUNTER — Emergency Department

## 2023-05-19 ENCOUNTER — Observation Stay
Admission: EM | Admit: 2023-05-19 | Discharge: 2023-05-19 | Disposition: A | Discharge status: Home / Self Care | Attending: Osteopathic Medicine | Admitting: Osteopathic Medicine

## 2023-05-19 ENCOUNTER — Other Ambulatory Visit: Payer: Self-pay

## 2023-05-19 DIAGNOSIS — I10 Essential (primary) hypertension: Secondary | ICD-10-CM | POA: Diagnosis not present

## 2023-05-19 DIAGNOSIS — Z79899 Other long term (current) drug therapy: Secondary | ICD-10-CM | POA: Diagnosis not present

## 2023-05-19 DIAGNOSIS — R030 Elevated blood-pressure reading, without diagnosis of hypertension: Secondary | ICD-10-CM | POA: Diagnosis present

## 2023-05-19 DIAGNOSIS — R519 Headache, unspecified: Secondary | ICD-10-CM | POA: Diagnosis present

## 2023-05-19 DIAGNOSIS — F419 Anxiety disorder, unspecified: Secondary | ICD-10-CM | POA: Diagnosis not present

## 2023-05-19 DIAGNOSIS — U071 COVID-19: Principal | ICD-10-CM

## 2023-05-19 DIAGNOSIS — F32A Depression, unspecified: Secondary | ICD-10-CM | POA: Insufficient documentation

## 2023-05-19 DIAGNOSIS — Z7901 Long term (current) use of anticoagulants: Secondary | ICD-10-CM | POA: Insufficient documentation

## 2023-05-19 DIAGNOSIS — R079 Chest pain, unspecified: Secondary | ICD-10-CM | POA: Diagnosis not present

## 2023-05-19 LAB — CBC
HCT: 42.5 % (ref 39.0–52.0)
Hemoglobin: 14.8 g/dL (ref 13.0–17.0)
MCH: 31.6 pg (ref 26.0–34.0)
MCHC: 34.8 g/dL (ref 30.0–36.0)
MCV: 90.6 fL (ref 80.0–100.0)
Platelets: 171 10*3/uL (ref 150–400)
RBC: 4.69 MIL/uL (ref 4.22–5.81)
RDW: 12.9 % (ref 11.5–15.5)
WBC: 5.1 10*3/uL (ref 4.0–10.5)
nRBC: 0 % (ref 0.0–0.2)

## 2023-05-19 LAB — BASIC METABOLIC PANEL
Anion gap: 10 (ref 5–15)
BUN: 12 mg/dL (ref 8–23)
CO2: 25 mmol/L (ref 22–32)
Calcium: 9.5 mg/dL (ref 8.9–10.3)
Chloride: 97 mmol/L — ABNORMAL LOW (ref 98–111)
Creatinine, Ser: 1.17 mg/dL (ref 0.61–1.24)
GFR, Estimated: >60 mL/min (ref >60)
Glucose, Bld: 147 mg/dL — ABNORMAL HIGH (ref 70–99)
Potassium: 3.7 mmol/L (ref 3.5–5.1)
Sodium: 132 mmol/L — ABNORMAL LOW (ref 135–145)

## 2023-05-19 LAB — RESP PANEL BY RT-PCR (RSV, FLU A&B, COVID)  RVPGX2
Influenza A by PCR: NEGATIVE
Influenza B by PCR: NEGATIVE
Resp Syncytial Virus by PCR: NEGATIVE
SARS Coronavirus 2 by RT PCR: POSITIVE — AB

## 2023-05-19 LAB — TROPONIN I (HIGH SENSITIVITY)
Troponin I (High Sensitivity): 15 ng/L (ref <18)
Troponin I (High Sensitivity): 18 ng/L — ABNORMAL HIGH (ref <18)
Troponin I (High Sensitivity): 20 ng/L — ABNORMAL HIGH (ref <18)

## 2023-05-19 MED ORDER — MAGNESIUM HYDROXIDE 400 MG/5ML PO SUSP: 30.0000 mL | Freq: Every day | ORAL | Status: DC | PRN

## 2023-05-19 MED ORDER — ACETAMINOPHEN 325 MG PO TABS: 650.0000 mg | ORAL_TABLET | ORAL | Status: DC | PRN

## 2023-05-19 MED ORDER — AMLODIPINE BESYLATE 5 MG PO TABS
5.0000 mg | ORAL_TABLET | ORAL | Status: DC
  Administered 2023-05-19: 5 mg via ORAL
  Filled 2023-05-19: qty 1

## 2023-05-19 MED ORDER — SODIUM CHLORIDE 0.9 % IV SOLN
INTRAVENOUS | Status: DC
Start: 2023-05-19 — End: 2023-05-19

## 2023-05-19 MED ORDER — ASPIRIN 81 MG PO CHEW: 81.0000 mg | CHEWABLE_TABLET | Freq: Every day | ORAL | Status: DC

## 2023-05-19 MED ORDER — ENOXAPARIN SODIUM 60 MG/0.6ML IJ SOSY
50.0000 mg | PREFILLED_SYRINGE | INTRAMUSCULAR | Status: DC
  Administered 2023-05-19: 50 mg via SUBCUTANEOUS
  Filled 2023-05-19: qty 0.6

## 2023-05-19 MED ORDER — ASPIRIN 81 MG PO CHEW
324.0000 mg | CHEWABLE_TABLET | Freq: Once | ORAL | Status: AC
  Administered 2023-05-19: 324 mg via ORAL
  Filled 2023-05-19: qty 4

## 2023-05-19 MED ORDER — ALPRAZOLAM 0.25 MG PO TABS: 0.2500 mg | ORAL_TABLET | Freq: Two times a day (BID) | ORAL | Status: DC | PRN

## 2023-05-19 MED ORDER — ONDANSETRON HCL 4 MG/2ML IJ SOLN: 4.0000 mg | Freq: Four times a day (QID) | INTRAMUSCULAR | Status: DC | PRN

## 2023-05-19 MED ORDER — BENZONATATE 200 MG PO CAPS: 200.0000 mg | ORAL_CAPSULE | Freq: Three times a day (TID) | ORAL | 0 refills | Status: DC | PRN

## 2023-05-19 MED ORDER — TRAZODONE HCL 50 MG PO TABS: 25.0000 mg | ORAL_TABLET | Freq: Every evening | ORAL | Status: DC | PRN

## 2023-05-19 MED ORDER — HYDRALAZINE HCL 20 MG/ML IJ SOLN
5.0000 mg | Freq: Once | INTRAMUSCULAR | Status: AC
  Administered 2023-05-19: 5 mg via INTRAVENOUS
  Filled 2023-05-19: qty 1

## 2023-05-19 MED ORDER — ACETAMINOPHEN 325 MG PO TABS
650.0000 mg | ORAL_TABLET | ORAL | Status: DC | PRN
  Administered 2023-05-19: 650 mg via ORAL
  Filled 2023-05-19: qty 2

## 2023-05-19 NOTE — Progress Notes (Signed)
 PHARMACIST - PHYSICIAN COMMUNICATION  CONCERNING:  Enoxaparin (Lovenox) for DVT Prophylaxis    RECOMMENDATION: Patient was prescribed enoxaprin 40mg  q24 hours for VTE prophylaxis.   Filed Weights   05/19/23 0337  Weight: 104.3 kg (230 lb)    Body mass index is 33 kg/m.  Estimated Creatinine Clearance: 62.9 mL/min (by C-G formula based on SCr of 1.17 mg/dL).   Based on [facility] policy patient is candidate for enoxaparin 0.5mg /kg TBW SQ every 24 hours based on BMI being >30.  DESCRIPTION: Pharmacy has adjusted enoxaparin dose per [facility] policy.  Patient is now receiving enoxaparin 0.5 mg/kg every 24 hours   Otelia Sergeant, PharmD, [facility] 05/19/2023 6:07 AM

## 2023-05-19 NOTE — H&P (Signed)
 HISTORY AND PHYSICAL + DISCHARGE SAME-DAY    Alex Underwood   NWG:[id number] DOB: [birth year]   Date of Service: 05/19/23 Requesting physician/APP from ED: Treatment Team:  Attending Provider: Sunnie Nielsen, DO  PCP: Lauro Regulus, MD     HPI/Hospital course:   Alex Underwood is a 78 y.o. male who presented to the ED from home with a chief complaint of elevated blood pressure, chest pain, shortness of breath and dizziness.  Has been unable to sleep because of this. Has been having elevated blood pressure issues at home.  Denies vision changes, neck pain, abdominal pain, nausea or vomiting.  States he had the flu 3 weeks ago and symptoms resolved.  Reports fatigue, coughing.   In ED, troponin flat, EKG no concerns, (+)COVID. Admitted to hospitalist service. Repeat troponin in afternoon remains flat, slightly above normal but suspect d/t HTN/dehydration. Pt has had no chest pain while here.   Pt was tolerating diet, ambulating well, headache resolved, no chest pain, no SOB. Pt comfortable with discharge home.    Consultants:  none  Procedures: none      ASSESSMENT & PLAN:   Chest pain, resolved Excluded ACS Likely d/t COVID / HTN urgency  Return precautions Symptomatic care for COVID  COVID19 No hypoxia Mild cough Symptomatic care Infection prevention measures discussed w/ patient   HTN urgency resolved Essential HTN Continue home medications  Anxiety/depression Continue home medications   Outpatient follow-up: follow w/ PCP as needed                Review of Systems:  Review of Systems  Constitutional:  Positive for malaise/fatigue. Negative for chills and fever.  Respiratory:  Positive for cough. Negative for hemoptysis, sputum production, shortness of breath and wheezing.   Cardiovascular:  Positive for chest pain. Negative for palpitations, orthopnea, claudication and leg swelling.  Gastrointestinal:  Negative for abdominal pain,  blood in stool, constipation, diarrhea, nausea and vomiting.  Musculoskeletal:  Negative for myalgias.  Skin:  Negative for rash.  Neurological:  Negative for focal weakness.  Psychiatric/Behavioral:  Negative for depression.        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  No current facility-administered medications on file prior to encounter.   Current Outpatient Medications on File Prior to Encounter  Medication Sig Dispense Refill   amLODipine (NORVASC) 5 MG tablet Take 5 mg by mouth every morning.      aspirin 81 MG chewable tablet Chew 81 mg by mouth daily.      desvenlafaxine (PRISTIQ) 50 MG 24 hr tablet Take 50 mg by mouth every morning.      ketoconazole (NIZORAL) 2 % cream Apply 1 Application topically at bedtime. To feet and nails 60 g 11   Propylene Glycol (SYSTANE BALANCE) 0.6 % SOLN Place 1 drop into the right eye daily as needed (DRY EYES).     traZODone (DESYREL) 150 MG tablet Take 150 mg by mouth at bedtime.     testosterone (ANDROGEL) 50 MG/5GM (1%) GEL Place 5 g onto the skin daily. (Patient not taking: Reported on 05/19/2023)       Allergies  Allergen Reactions   Codeine Other (See Comments)    Hallucinations.   Lamisil [Terbinafine] Rash   Penicillins Rash   Terbinafine Rash      family  history includes Skin cancer in his brother and sister. Past Surgical History:  Procedure Laterality Date   COLONOSCOPY     COLONOSCOPY WITH PROPOFOL N/A 09/20/2019   Procedure: COLONOSCOPY WITH PROPOFOL;  Surgeon: Wyline Mood, MD;  Location: [facility] ENDOSCOPY;  Service: Gastroenterology;  Laterality: N/A;   EYE SURGERY Bilateral    CATARACTS   MOHS SURGERY  2009   forehead; B[facility], [facility]   PAROTIDECTOMY Right 07/21/2015    Procedure: PAROTIDECTOMY;  Surgeon: Vernie Murders, MD;  Location: [facility] ORS;  Service: ENT;  Laterality: Right;   PENILE PROSTHESIS IMPLANT     PROSTATECTOMY     TOE SURGERY            Objective Findings:  Vitals:   05/19/23 0506 05/19/23 0530 05/19/23 0900 05/19/23 1228  BP: (!) 152/65 (!) 152/67 (!) 145/70 (!) 142/75  Pulse: 87 88 84 77  Resp: 17 18 20 18   Temp:   98.7 F (37.1 C) 98.9 F (37.2 C)  TempSrc:   Oral Oral  SpO2: 95% 95% 95% 94%  Weight:      Height:       No intake or output data in the 24 hours ending 05/19/23 1332 Filed Weights   05/19/23 0337  Weight: 104.3 kg    Examination:  Physical Exam Constitutional:      General: He is not in acute distress.    Appearance: He is not ill-appearing or diaphoretic.  Cardiovascular:     Rate and Rhythm: Normal rate and regular rhythm.  Pulmonary:     Effort: Pulmonary effort is normal. No tachypnea or accessory muscle usage.     Breath sounds: Normal breath sounds.  Abdominal:     Palpations: Abdomen is soft.  Musculoskeletal:     Cervical back: Neck supple.     Right lower leg: No edema.     Left lower leg: No edema.  Neurological:     General: No focal deficit present.     Mental Status: He is alert and oriented to person, place, and time.  Psychiatric:        Mood and Affect: Mood normal.        Behavior: Behavior normal.          Scheduled Medications:   amLODipine  5 mg Oral BH-q7a   [START ON 05/20/2023] aspirin  81 mg Oral Daily   enoxaparin (LOVENOX) injection  50 mg Subcutaneous Q24H    Continuous Infusions:   PRN Medications:  acetaminophen, ALPRAZolam, magnesium hydroxide, ondansetron (ZOFRAN) IV, traZODone  Antimicrobials:  Anti-infectives (From admission, onward)    None           Data Reviewed: I have personally reviewed following labs and imaging studies  CBC: Recent Labs  Lab 05/19/23 0342  WBC 5.1  HGB 14.8  HCT 42.5  MCV 90.6  PLT 171   Basic  Metabolic Panel: Recent Labs  Lab 05/19/23 0342  NA 132*  K 3.7  CL 97*  CO2 25  GLUCOSE 147*  BUN 12  CREATININE 1.17  CALCIUM 9.5   GFR: Estimated Creatinine Clearance: 62.9 mL/min (by C-G formula based on SCr of 1.17 mg/dL). Liver Function Tests: No results for input(s): "AST", "ALT", "ALKPHOS", "BILITOT", "PROT", "ALBUMIN" in the last 168 hours. No results for input(s): "LIPASE", "AMYLASE" in the last 168 hours. No results for input(s): "AMMONIA" in the last 168 hours. Coagulation Profile: No results for input(s): "INR", "PROTIME" in the last 168 hours. Cardiac Enzymes: No  results for input(s): "CKTOTAL", "CKMB", "CKMBINDEX", "TROPONINI" in the last 168 hours. BNP (last 3 results) No results for input(s): "PROBNP" in the last 8760 hours. HbA1C: No results for input(s): "HGBA1C" in the last 72 hours. CBG: No results for input(s): "GLUCAP" in the last 168 hours. Lipid Profile: No results for input(s): "CHOL", "HDL", "LDLCALC", "TRIG", "CHOLHDL", "LDLDIRECT" in the last 72 hours. Thyroid Function Tests: No results for input(s): "TSH", "T4TOTAL", "FREET4", "T3FREE", "THYROIDAB" in the last 72 hours. Anemia Panel: No results for input(s): "VITAMINB12", "FOLATE", "FERRITIN", "TIBC", "IRON", "RETICCTPCT" in the last 72 hours. Most Recent Urinalysis On File:  No results found for: "COLORURINE", "APPEARANCEUR", "LABSPEC", "PHURINE", "GLUCOSEU", "HGBUR", "BILIRUBINUR", "KETONESUR", "PROTEINUR", "UROBILINOGEN", "NITRITE", "LEUKOCYTESUR" Sepsis Labs: @LABRCNTIP (procalcitonin:4,lacticidven:4)  Recent Results (from the past 240 hours)  Resp panel by RT-PCR (RSV, Flu A&B, Covid) Anterior Nasal Swab     Status: Abnormal   Collection Time: 05/19/23  5:09 AM   Specimen: Anterior Nasal Swab  Result Value Ref Range Status   SARS Coronavirus 2 by RT PCR POSITIVE (A) NEGATIVE Final    Comment: (NOTE) SARS-CoV-2 target nucleic acids are DETECTED.  The SARS-CoV-2 RNA is generally  detectable in upper respiratory specimens during the acute phase of infection. Positive results are indicative of the presence of the identified virus, but do not rule out bacterial infection or co-infection with other pathogens not detected by the test. Clinical correlation with patient history and other diagnostic information is necessary to determine patient infection status. The expected result is Negative.  Fact Sheet for Patients: BloggerCourse.com  Fact Sheet for Healthcare Providers: SeriousBroker.it  This test is not yet approved or cleared by the [place] FDA and  has been authorized for detection and/or diagnosis of SARS-CoV-2 by FDA under an Emergency Use Authorization (EUA).  This EUA will remain in effect (meaning this test can be used) for the duration of  the COVID-19 declaration under Section 564(b)(1) of the A ct, 21 U.S.C. section 360bbb-3(b)(1), unless the authorization is terminated or revoked sooner.     Influenza A by PCR NEGATIVE NEGATIVE Final   Influenza B by PCR NEGATIVE NEGATIVE Final    Comment: (NOTE) The Xpert Xpress SARS-CoV-2/FLU/RSV plus assay is intended as an aid in the diagnosis of influenza from Nasopharyngeal swab specimens and should not be used as a sole basis for treatment. Nasal washings and aspirates are unacceptable for Xpert Xpress SARS-CoV-2/FLU/RSV testing.  Fact Sheet for Patients: BloggerCourse.com  Fact Sheet for Healthcare Providers: SeriousBroker.it  This test is not yet approved or cleared by the [place] FDA and has been authorized for detection and/or diagnosis of SARS-CoV-2 by FDA under an Emergency Use Authorization (EUA). This EUA will remain in effect (meaning this test can be used) for the duration of the COVID-19 declaration under Section 564(b)(1) of the Act, 21 U.S.C. section 360bbb-3(b)(1), unless the  authorization is terminated or revoked.     Resp Syncytial Virus by PCR NEGATIVE NEGATIVE Final    Comment: (NOTE) Fact Sheet for Patients: BloggerCourse.com  Fact Sheet for Healthcare Providers: SeriousBroker.it  This test is not yet approved or cleared by the [place] FDA and has been authorized for detection and/or diagnosis of SARS-CoV-2 by FDA under an Emergency Use Authorization (EUA). This EUA will remain in effect (meaning this test can be used) for the duration of the COVID-19 declaration under Section 564(b)(1) of the Act, 21 U.S.C. section 360bbb-3(b)(1), unless the authorization is terminated or revoked.  Performed at [facility], [address]  [address], [place], [place] [address]          Radiology Studies: CT Head Wo Contrast Result Date: 05/19/2023 CLINICAL DATA:  78 year old male with headache and hypertension. Chest pain and shortness of breath. Dizziness. EXAM: CT HEAD WITHOUT CONTRAST TECHNIQUE: Contiguous axial images were obtained from the base of the skull through the vertex without intravenous contrast. RADIATION DOSE REDUCTION: This exam was performed according to the departmental dose-optimization program which includes automated exposure control, adjustment of the mA and/or kV according to patient size and/or use of iterative reconstruction technique. COMPARISON:  Brain MRI 08/22/2015. FINDINGS: Brain: Intermittent motion artifact. Cerebral volume has not significantly changed since 2017. No midline shift, ventriculomegaly, mass effect, evidence of mass lesion, intracranial hemorrhage or evidence of cortically based acute infarction. Gray-white matter differentiation is within normal limits throughout the brain. Stretch that Gray-white matter differentiation is within normal limits throughout the brain. Vascular: No suspicious intracranial vascular hyperdensity. Skull: Intermittent motion artifact. No  acute osseous abnormality identified. Sinuses/Orbits: Visualized paranasal sinuses and mastoids are stable and well aerated. Tympanic cavities appear clear. Other: No acute orbit or scalp soft tissue finding identified. Chronic asymmetry of the parotid glands might be related to previous partial right parotidectomy. IMPRESSION: No acute intracranial abnormality identified on mildly motion degraded exam. Electronically Signed   By: Odessa Fleming M.D.   On: 05/19/2023 05:28   DG Chest 2 View Result Date: 05/19/2023 CLINICAL DATA:  78 year old male with chest pain, shortness of breath, dizziness, hypertension. EXAM: CHEST - 2 VIEW COMPARISON:  CT Abdomen and Pelvis 07/19/2022. FINDINGS: PA and lateral views 0404 hours. Stable heart size at the upper limits of normal. Other mediastinal contours are within normal limits. Visualized tracheal air column is within normal limits. Normal lung volumes. No pneumothorax, pulmonary edema, pleural effusion or confluent lung opacity. 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 No acute osseous abnormality identified. Negative visible bowel gas. IMPRESSION: Borderline cardiomegaly.  No acute cardiopulmonary abnormality. Electronically Signed   By: Odessa Fleming M.D.   On: 05/19/2023 04:40             LOS: 0 days     Sunnie Nielsen, DO [facility] 05/19/2023, 1:32 PM    Dictation software may have been used to generate the above note. Typos may occur and escape review in typed/dictated notes. Please contact Dr Lyn Hollingshead directly for clarity if needed.  Staff may message me via secure chat in Epic  but this may not receive an immediate response,  please page me for urgent matters!  If 7PM-7AM, please contact night coverage [email or web address]

## 2023-05-19 NOTE — ED Provider Notes (Signed)
 [facility] Provider Note    Event Date/Time   First MD Initiated Contact with Patient 05/19/23 [phone]     (approximate)   History   Chest Pain and Hypertension   HPI  Alex Underwood is a 78 y.o. male who [facility] to the ED from home with a chief complaint of elevated blood pressure, chest pain, shortness of breath and dizziness.  Has been unable to sleep because of this.  Takes Norvasc daily; reports compliance with medication.  States he placed a salve of baking soda plus vinegar on his forehead for sunburn yesterday.  He began to burn his skin so he removed it.  Has been having elevated blood pressure issues since.  Denies vision changes, neck pain, abdominal pain, nausea or vomiting.  States he had the flu 3 weeks ago and symptoms resolved.  Girlfriend recently tested positive for flu this week.     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 forehead. Superficial and nodular patterns. Excised 03/04/2015, margins free.   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   Hyperglycemia 02/05/2015   Hypertension 06/24/2013   Hypogonadism male 06/24/2013   Insomnia 06/24/2013   Pure hypercholesterolemia 06/24/2013   Major depression in remission (HCC) 06/24/2013   Major depressive disorder, recurrent (HCC) 02/01/2011   Generalized anxiety disorder 02/01/2011   Alcohol abuse 02/01/2011     Past Surgical History    Past Surgical History:  Procedure Laterality Date   COLONOSCOPY     COLONOSCOPY WITH PROPOFOL N/A 09/20/2019   Procedure: COLONOSCOPY WITH PROPOFOL;  Surgeon: Wyline Mood, MD;  Location: [facility] ENDOSCOPY;  Service: Gastroenterology;  Laterality: N/A;   EYE SURGERY Bilateral    CATARACTS   MOHS SURGERY  2009   forehead; [facility], [facility]   PAROTIDECTOMY Right 07/21/2015   Procedure: PAROTIDECTOMY;  Surgeon: Vernie Murders, MD;  Location: [facility] ORS;  Service: ENT;  Laterality: Right;   PENILE PROSTHESIS IMPLANT     PROSTATECTOMY     TOE SURGERY       Home Medications   Prior to Admission medications   Medication Sig Start Date End Date Taking? Authorizing Provider  amLODipine (NORVASC) 5 MG tablet Take 5 mg by mouth every morning.     [provider]  aspirin 81 MG chewable tablet Chew 81 mg by mouth daily.     [provider]  desvenlafaxine (PRISTIQ) 50 MG 24 hr tablet Take 50 mg by mouth every morning.     [provider]  gabapentin (NEURONTIN) 100 MG capsule Take 100 mg by mouth 3 (three) times daily. Patient not taking: Reported on 07/22/2022 07/10/21   [provider]  ketoconazole (NIZORAL) 2 % cream Apply 1 Application topically at bedtime. To feet and nails 03/22/22 03/11/24  Deirdre Evener, MD  testosterone (  ANDRODERM) 4 MG/24HR PT24 patch Place 1 patch onto the skin daily. Patient not taking: Reported on 07/23/2021    [provider]  testosterone (ANDROGEL) 50 MG/5GM (1%) GEL Place 5 g onto the skin daily.    [provider]  traZODone (DESYREL) 150 MG tablet Take by mouth at bedtime.    [provider]     Allergies  Codeine, Lamisil [terbinafine], Penicillins, and Terbinafine   Family History   Family History  Problem Relation Age of Onset   Skin cancer Sister    Skin cancer Brother      Physical Exam  Triage Vital Signs: ED Triage Vitals  Encounter Vitals Group     BP 05/19/23  0339 (!) 178/78     Systolic BP Percentile --      Diastolic BP Percentile --      Pulse Rate 05/19/23 0339 (!) 104     Resp 05/19/23 0339 18     Temp 05/19/23 0339 98.3 F (36.8 C)     Temp Source 05/19/23 0339 Oral     SpO2 05/19/23 0339 99 %     Weight 05/19/23 0337 230 lb (104.3 kg)     Height 05/19/23 0337 5\' 10"  (1.778 m)     Head Circumference --      Peak Flow --      Pain Score 05/19/23 0337 10     Pain Loc --      Pain Education --      Exclude from Growth Chart --     Updated Vital Signs: BP (!) 152/67   Pulse 88   Temp 98.3 F (36.8 C) (Oral)   Resp 18   Ht 5\' 10"  (1.778 m)   Wt 104.3 kg   SpO2 95%   BMI 33.00 kg/m    General: Awake, mild distress.  CV:  RRR.  Good peripheral perfusion.  Resp:  Normal effort.  CTAB. Abd:  Nontender.  No distention.  Other:  PERRL.  EOMI.  No carotid bruits.  Supple neck without meningismus.  No thermal burn to forehead.  Alert and oriented x 3.  CN II-XII grossly intact.  5/5 motor strength and sensation all extremities.  M AE x 4.   ED Results / Procedures / Treatments  Labs (all labs ordered are listed, but only abnormal results are displayed) Labs Reviewed  RESP PANEL BY RT-PCR (RSV, FLU A&B, COVID)  [id number] - Abnormal; Notable for the following components:      Result Value   SARS Coronavirus 2 by RT PCR POSITIVE (*)    All other components within normal limits  BASIC METABOLIC PANEL - Abnormal; Notable for the following components:   Sodium 132 (*)    Chloride 97 (*)    Glucose, Bld 147 (*)    All other components within normal limits  TROPONIN I (HIGH SENSITIVITY) - Abnormal; Notable for the following components:   Troponin I (High Sensitivity) 18 (*)    All other components within normal limits  CBC  TROPONIN I (HIGH SENSITIVITY)     EKG  ED ECG REPORT I, Benjimin Hadden J, the attending physician, personally viewed and interpreted this ECG.   Date: 05/19/2023  EKG Time: 0342  Rate: 99  Rhythm: normal  sinus rhythm  Axis: LAD  Intervals:right bundle branch block  ST&T Change: Nonspecific    RADIOLOGY I have independently visualized and interpreted patient's imaging study as well as noted the radiology interpretation:  Chest x-ray: No acute cardiopulmonary  process  CT head: No ICH  Official radiology report(s): CT Head Wo Contrast Result Date: 05/19/2023 CLINICAL DATA:  78 year old male with headache and hypertension. Chest pain and shortness of breath. Dizziness. EXAM: CT HEAD WITHOUT CONTRAST TECHNIQUE: Contiguous axial images were obtained from the base of the skull through the vertex without intravenous contrast. RADIATION DOSE REDUCTION: This exam was performed according to the departmental dose-optimization program which includes automated exposure control, adjustment of the mA and/or kV according to patient size and/or use of iterative reconstruction technique. COMPARISON:  Brain MRI 08/22/2015. FINDINGS: Brain: Intermittent motion artifact. Cerebral volume has not significantly changed since 2017. No midline shift, ventriculomegaly, mass effect, evidence of mass lesion, intracranial hemorrhage or evidence of cortically based acute infarction. Gray-white matter differentiation is within normal limits throughout the brain. Stretch that Gray-white matter differentiation is within normal limits throughout the brain. Vascular: No suspicious intracranial vascular hyperdensity. Skull: Intermittent motion artifact. No acute osseous abnormality identified. Sinuses/Orbits: Visualized paranasal sinuses and mastoids are stable and well aerated. Tympanic cavities appear clear. Other: No acute orbit or scalp soft tissue finding identified. Chronic asymmetry of the parotid glands might be related to previous partial right parotidectomy. IMPRESSION: No acute intracranial abnormality identified on mildly motion degraded exam. Electronically Signed   By: Odessa Fleming M.D.   On: 05/19/2023 05:28   DG Chest 2  View Result Date: 05/19/2023 CLINICAL DATA:  78 year old male with chest pain, shortness of breath, dizziness, hypertension. EXAM: CHEST - 2 VIEW COMPARISON:  CT Abdomen and Pelvis 07/19/2022. FINDINGS: PA and lateral views 0404 hours. Stable heart size at the upper limits of normal. Other mediastinal contours are within normal limits. Visualized tracheal air column is within normal limits. Normal lung volumes. No pneumothorax, pulmonary edema, pleural effusion or confluent lung opacity. 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 No acute osseous abnormality identified. Negative visible bowel gas. IMPRESSION: Borderline cardiomegaly.  No acute cardiopulmonary abnormality. Electronically Signed   By: Odessa Fleming M.D.   On: 05/19/2023 04:40     PROCEDURES:  Critical Care performed: Yes, see critical care procedure note(s)  CRITICAL CARE Performed by: Irean Hong   Total critical care time: 30 minutes  Critical care time was exclusive of separately billable procedures and treating other patients.  Critical care was necessary to treat or prevent imminent or life-threatening deterioration.  Critical care was time spent personally by me on the following activities: development of treatment plan with patient and/or surrogate as well as nursing, discussions with consultants, evaluation of patient's response to treatment, examination of patient, obtaining history from patient or surrogate, ordering and performing treatments and interventions, ordering and review of laboratory studies, ordering and review of radiographic studies, pulse oximetry and re-evaluation of patient's condition.   Marland Kitchen1-3 Lead EKG Interpretation  Performed by: Irean Hong, MD Authorized by: Irean Hong, MD     Interpretation: normal     ECG rate:  95   ECG rate assessment: normal     Rhythm: sinus rhythm     Ectopy: none     Conduction: normal   Comments:     Patient placed on cardiac monitor  to evaluate for arrhythmias    MEDICATIONS ORDERED IN ED: Medications  acetaminophen (TYLENOL) tablet 650 mg (has no administration in time range)  ondansetron (ZOFRAN) injection 4 mg (has no administration in time range)  enoxaparin (LOVENOX) injection 50 mg (has no administration in time range)  0.9 %  sodium chloride infusion ( Intravenous  New Bag/Given 05/19/23 0611)  ALPRAZolam Prudy Feeler) tablet 0.25 mg (has no administration in time range)  traZODone (DESYREL) tablet 25 mg (has no administration in time range)  magnesium hydroxide (MILK OF MAGNESIA) suspension 30 mL (has no administration in time range)  hydrALAZINE (APRESOLINE) injection 5 mg (5 mg Intravenous Given 05/19/23 0506)  aspirin chewable tablet 324 mg (324 mg Oral Given 05/19/23 0539)     IMPRESSION / MDM / ASSESSMENT AND PLAN / ED COURSE  I reviewed the triage vital signs and the nursing notes.                             78 year old male presenting with chest pain, headache and elevated blood pressure. Differential diagnosis includes, but is not limited to, ACS, aortic dissection, pulmonary embolism, cardiac tamponade, pneumothorax, pneumonia, pericarditis, myocarditis, GI-related causes including esophagitis/gastritis, and musculoskeletal chest wall pain.   I personally reviewed patient's records and note a PCP office visit from 04/12/2023 for sinusitis.  Patient's presentation is most consistent with acute presentation with potential threat to life or bodily function.  The patient is on the cardiac monitor to evaluate for evidence of arrhythmia and/or significant heart rate changes.  Laboratory results unremarkable.  Initial troponin 15.  Chest x-ray clear.  Obtain CT head for complaints of headache in the setting of elevated blood pressure, administer low-dose IV Hydralazine.  Will reassess.  Clinical Course as of 05/19/23 0705  Thu May 19, 2023  0530 CT head negative.  Will consult hospitalist services for evaluation and  admission. [JS]  [id number] Respiratory panel positive for COVID-19. [JS]    Clinical Course User Index [JS] Irean Hong, MD     FINAL CLINICAL IMPRESSION(S) / ED DIAGNOSES   Final diagnoses:  Chest pain, unspecified type  Hypertension, unspecified type  COVID-19     Rx / DC Orders   ED Discharge Orders     None        Note:  This document was prepared using Dragon voice recognition software and may include unintentional dictation errors.   Irean Hong, MD 05/19/23 [phone]

## 2023-05-19 NOTE — ED Triage Notes (Addendum)
 Pt to ED via POV c/o high blood pressure, generalized CP, shob. Pt reports having cp, shob, and dizziness all night. Hasn't been able to sleep. Also endorses headache. Pt checked BP 20 mins ago and it was 161/97. Pt takes amlodipine. Hx HTN

## 2023-05-19 NOTE — ED Notes (Signed)
 Lab at bedsdie collecting morning labs

## 2023-05-19 NOTE — ED Notes (Signed)
 This RN notified Mansy, MD that patient is requesting medication for his headache.

## 2023-05-19 NOTE — ED Notes (Signed)
 CCMD called to place pt on monitor

## 2023-06-15 ENCOUNTER — Emergency Department (HOSPITAL_COMMUNITY)
Admission: EM | Admit: 2023-06-15 | Discharge: 2023-06-16 | Disposition: A | Discharge status: Other Acute Inpatient Hospital | Attending: Emergency Medicine | Admitting: Emergency Medicine

## 2023-06-15 ENCOUNTER — Encounter (HOSPITAL_COMMUNITY): Payer: Self-pay

## 2023-06-15 ENCOUNTER — Other Ambulatory Visit: Payer: Self-pay

## 2023-06-15 ENCOUNTER — Other Ambulatory Visit (HOSPITAL_COMMUNITY): Payer: Self-pay

## 2023-06-15 DIAGNOSIS — F332 Major depressive disorder, recurrent severe without psychotic features: Secondary | ICD-10-CM | POA: Insufficient documentation

## 2023-06-15 DIAGNOSIS — S61519A Laceration without foreign body of unspecified wrist, initial encounter: Secondary | ICD-10-CM

## 2023-06-15 DIAGNOSIS — S51812A Laceration without foreign body of left forearm, initial encounter: Secondary | ICD-10-CM | POA: Insufficient documentation

## 2023-06-15 DIAGNOSIS — S61512A Laceration without foreign body of left wrist, initial encounter: Secondary | ICD-10-CM | POA: Insufficient documentation

## 2023-06-15 DIAGNOSIS — S61511A Laceration without foreign body of right wrist, initial encounter: Secondary | ICD-10-CM | POA: Insufficient documentation

## 2023-06-15 DIAGNOSIS — S51012A Laceration without foreign body of left elbow, initial encounter: Secondary | ICD-10-CM | POA: Insufficient documentation

## 2023-06-15 DIAGNOSIS — S51811A Laceration without foreign body of right forearm, initial encounter: Secondary | ICD-10-CM | POA: Insufficient documentation

## 2023-06-15 DIAGNOSIS — T1491XA Suicide attempt, initial encounter: Secondary | ICD-10-CM

## 2023-06-15 DIAGNOSIS — X781XXA Intentional self-harm by knife, initial encounter: Secondary | ICD-10-CM | POA: Insufficient documentation

## 2023-06-15 DIAGNOSIS — Z23 Encounter for immunization: Secondary | ICD-10-CM | POA: Insufficient documentation

## 2023-06-15 DIAGNOSIS — E871 Hypo-osmolality and hyponatremia: Secondary | ICD-10-CM | POA: Diagnosis not present

## 2023-06-15 DIAGNOSIS — R739 Hyperglycemia, unspecified: Secondary | ICD-10-CM | POA: Insufficient documentation

## 2023-06-15 DIAGNOSIS — I444 Left anterior fascicular block: Secondary | ICD-10-CM | POA: Insufficient documentation

## 2023-06-15 DIAGNOSIS — E878 Other disorders of electrolyte and fluid balance, not elsewhere classified: Secondary | ICD-10-CM | POA: Diagnosis not present

## 2023-06-15 DIAGNOSIS — Z7982 Long term (current) use of aspirin: Secondary | ICD-10-CM | POA: Insufficient documentation

## 2023-06-15 DIAGNOSIS — E872 Acidosis, unspecified: Secondary | ICD-10-CM | POA: Diagnosis not present

## 2023-06-15 LAB — COOXEMETRY PANEL
Carboxyhemoglobin: 1 % (ref 0.5–1.5)
Methemoglobin: <0.7 % (ref 0.0–1.5)
O2 Saturation: 25.1 %
Total hemoglobin: 12.7 g/dL (ref 12.0–16.0)

## 2023-06-15 LAB — CBC
HCT: 38.3 % — ABNORMAL LOW (ref 39.0–52.0)
Hemoglobin: 13.3 g/dL (ref 13.0–17.0)
MCH: 31.4 pg (ref 26.0–34.0)
MCHC: 34.7 g/dL (ref 30.0–36.0)
MCV: 90.3 fL (ref 80.0–100.0)
Platelets: 305 10*3/uL (ref 150–400)
RBC: 4.24 MIL/uL (ref 4.22–5.81)
RDW: 13 % (ref 11.5–15.5)
WBC: 11 10*3/uL — ABNORMAL HIGH (ref 4.0–10.5)
nRBC: 0 % (ref 0.0–0.2)

## 2023-06-15 LAB — COMPREHENSIVE METABOLIC PANEL WITH GFR
ALT: 36 U/L (ref 0–44)
AST: 39 U/L (ref 15–41)
Albumin: 4 g/dL (ref 3.5–5.0)
Alkaline Phosphatase: 40 U/L (ref 38–126)
Anion gap: 16 — ABNORMAL HIGH (ref 5–15)
BUN: 14 mg/dL (ref 8–23)
CO2: 21 mmol/L — ABNORMAL LOW (ref 22–32)
Calcium: 9.4 mg/dL (ref 8.9–10.3)
Chloride: 94 mmol/L — ABNORMAL LOW (ref 98–111)
Creatinine, Ser: 1.56 mg/dL — ABNORMAL HIGH (ref 0.61–1.24)
GFR, Estimated: 45 mL/min — ABNORMAL LOW (ref >60)
Glucose, Bld: 202 mg/dL — ABNORMAL HIGH (ref 70–99)
Potassium: 4.5 mmol/L (ref 3.5–5.1)
Sodium: 131 mmol/L — ABNORMAL LOW (ref 135–145)
Total Bilirubin: 1.1 mg/dL (ref 0.0–1.2)
Total Protein: 7.3 g/dL (ref 6.5–8.1)

## 2023-06-15 LAB — I-STAT VENOUS BLOOD GAS, ED
Acid-base deficit: 4 mmol/L — ABNORMAL HIGH (ref 0.0–2.0)
Bicarbonate: 23.2 mmol/L (ref 20.0–28.0)
Calcium, Ion: 1.11 mmol/L — ABNORMAL LOW (ref 1.15–1.40)
HCT: 40 % (ref 39.0–52.0)
Hemoglobin: 13.6 g/dL (ref 13.0–17.0)
O2 Saturation: 62 %
Potassium: 4.5 mmol/L (ref 3.5–5.1)
Sodium: 130 mmol/L — ABNORMAL LOW (ref 135–145)
TCO2: 25 mmol/L (ref 22–32)
pCO2, Ven: 48.9 mmHg (ref 44–60)
pH, Ven: 7.284 (ref 7.25–7.43)
pO2, Ven: 36 mmHg (ref 32–45)

## 2023-06-15 LAB — URINALYSIS, ROUTINE W REFLEX MICROSCOPIC
Bilirubin Urine: NEGATIVE
Glucose, UA: NEGATIVE mg/dL
Hgb urine dipstick: NEGATIVE
Ketones, ur: 20 mg/dL — AB
Leukocytes,Ua: NEGATIVE
Nitrite: NEGATIVE
Protein, ur: NEGATIVE mg/dL
Specific Gravity, Urine: 1.014 (ref 1.005–1.030)
pH: 7 (ref 5.0–8.0)

## 2023-06-15 LAB — RAPID URINE DRUG SCREEN, HOSP PERFORMED
Amphetamines: NOT DETECTED
Barbiturates: NOT DETECTED
Benzodiazepines: NOT DETECTED
Cocaine: NOT DETECTED
Opiates: NOT DETECTED
Tetrahydrocannabinol: NOT DETECTED

## 2023-06-15 LAB — TROPONIN I (HIGH SENSITIVITY): Troponin I (High Sensitivity): 15 ng/L (ref <18)

## 2023-06-15 LAB — PROTIME-INR
INR: 1.2 (ref 0.8–1.2)
Prothrombin Time: 15 s (ref 11.4–15.2)

## 2023-06-15 LAB — I-STAT CHEM 8, ED
BUN: 14 mg/dL (ref 8–23)
Calcium, Ion: 1.09 mmol/L — ABNORMAL LOW (ref 1.15–1.40)
Chloride: 94 mmol/L — ABNORMAL LOW (ref 98–111)
Creatinine, Ser: 1.4 mg/dL — ABNORMAL HIGH (ref 0.61–1.24)
Glucose, Bld: 197 mg/dL — ABNORMAL HIGH (ref 70–99)
HCT: 41 % (ref 39.0–52.0)
Hemoglobin: 13.9 g/dL (ref 13.0–17.0)
Potassium: 4.5 mmol/L (ref 3.5–5.1)
Sodium: 130 mmol/L — ABNORMAL LOW (ref 135–145)
TCO2: 21 mmol/L — ABNORMAL LOW (ref 22–32)

## 2023-06-15 LAB — SALICYLATE LEVEL: Salicylate Lvl: <7 mg/dL — ABNORMAL LOW (ref 7.0–30.0)

## 2023-06-15 LAB — I-STAT CG4 LACTIC ACID, ED: Lactic Acid, Venous: 6.6 mmol/L (ref 0.5–1.9)

## 2023-06-15 LAB — ACETAMINOPHEN LEVEL: Acetaminophen (Tylenol), Serum: <10 ug/mL — ABNORMAL LOW (ref 10–30)

## 2023-06-15 LAB — ETHANOL: Alcohol, Ethyl (B): <10 mg/dL (ref <10)

## 2023-06-15 MED ORDER — TETANUS-DIPHTH-ACELL PERTUSSIS 5-2.5-18.5 LF-MCG/0.5 IM SUSY
0.5000 mL | PREFILLED_SYRINGE | Freq: Once | INTRAMUSCULAR | Status: AC
  Administered 2023-06-15: 0.5 mL via INTRAMUSCULAR
  Filled 2023-06-15: qty 0.5

## 2023-06-15 MED ORDER — LIDOCAINE-EPINEPHRINE (PF) 2 %-1:200000 IJ SOLN
20.0000 mL | Freq: Once | INTRAMUSCULAR | Status: AC
  Administered 2023-06-15: 20 mL
  Filled 2023-06-15: qty 20

## 2023-06-15 NOTE — TOC CM/SW Note (Signed)
 SW responded to Trauma 1 call for patient 78 y/o male with a history of depression and anxiety. Patient lives alone, recently his girlfriend moved out of the home back to her home. Patient has experienced increase in his blood pressure, he stated he had a sinus infection and covid. Patient stated healing from covid increase his depression, a lot of energy in his body, he felt helpless, tired and drained.    He has been eating and drinking daily while staying at his daughters house for 6 days.  earlier today he attempted suicide by sitting in his car in closed garage with engine running. He felt light headed but did not pass out. After the car attempt he resorted to self inflicted lacerations to bilateral arms. He cut his right wrist with a kitchen knife and left wrist and AC fossa with utility knife.  He began crawling outside in the drive way laid on his back until someone found him and called the police.   Patient has been feeling guilt about asking his girlfriend to move back to her own because of friction in the home, he has not been sleeping well, he denied attempting suicide in the past but have been hospitalized inpatient psych for his depression.   Patient has dealt with depression for over 25 years he stated, he has a daughter Vicki Mallet [phone]) (at bedside with husband Apolinar Junes). Patient's daughter stated the plan is to move him into their home to take care of him. Patient takes Precise (Antidepressants 50mg ) and Trazadone 150 mg at night.    Patient and family is interested in resources for therapy preference faith base to be exact.   Lily Peer, MSW, LCSWA Transition of Care  Clinical Social Worker (ED 3-11 Mon-Fri)  [phone]

## 2023-06-15 NOTE — ED Notes (Signed)
 TTS at bedside.

## 2023-06-15 NOTE — Progress Notes (Signed)
 Chaplain responds to Level 1 trauma and, along with LCSW Devin, provides support as pt shares his history of depression and recent struggle with extended illness and asking his girlfriend to move out. He expresses having been a nonbeliever but seeing his daughter's family's faith and how peaceful they are. He desires Jesus' peace to come to him and has been praying for it for a week. He requests prayer, which chaplain provides.

## 2023-06-15 NOTE — Consult Note (Addendum)
 Reason for Consult: Trauma Level 1 Referring Provider: Horton, DO  Alex Underwood is an 78 y.o. male who presents as a level 1 trauma following suicide attempt. Patient has history of depression. Denies suicide attempts in the past but earlier today he attempted suicide by sitting in his car in closed garage with engine running. He felt light headed but did not pass out. After about half an hour, he then abandoned this and this evening resorted to self inflicted lacerations to bilateral arms. He cut his right wrist with a kitchen knife and left wrist and AC fossa with utility knife.  Right tourniquet placed by EMS at 1655, left at 1703. Right tourniquet down at 1737, left tourniquet down at 1740.  Labs thus far notable for elevated lactate to 6.6, WBC slightly elevated to 11.0, hyponatremia 130, hypochloremia 94, elevated creatinine to 1.4--unclear baseline, hypocalcemia 1.09 (ionized). H/H within normal limits 13.9/41.0. Hyperglycemia 197.  Patient not on blood thinners but takes baby aspirin Denies drug or alcohol use. Does not remember when last tetanus vaccine was. Tdap ordered.   Primary Survey Airway: Intact Breathing: Bilateral breath sounds Circulation: 2+ femoral pulses   Objective   History reviewed. No pertinent past medical history.  History reviewed. No pertinent surgical history.  History reviewed. No pertinent family history.  Social History:  reports that he has never smoked. He has never used smokeless tobacco. No history on file for alcohol use and drug use.  Allergies: Not on File  Medications: I have reviewed the patient's current medications.  Labs: I have personally reviewed all labs for the past 24h Results for orders placed or performed during the hospital encounter of 06/15/23 (from the past 48 hours)  CBC     Status: Abnormal   Collection Time: 06/15/23  5:44 PM  Result Value Ref Range   WBC 11.0 (H) 4.0 - 10.5 K/uL   RBC 4.24 4.22 - 5.81 MIL/uL    Hemoglobin 13.3 13.0 - 17.0 g/dL   HCT 60.4 (L) 54.0 - 98.1 %   MCV 90.3 80.0 - 100.0 fL   MCH 31.4 26.0 - 34.0 pg   MCHC 34.7 30.0 - 36.0 g/dL   RDW 19.1 47.8 - 29.5 %   Platelets 305 150 - 400 K/uL   nRBC 0.0 0.0 - 0.2 %    Comment: Performed at [facility], [address], [place], [place] [address]  I-Stat Chem 8, ED     Status: Abnormal   Collection Time: 06/15/23  5:53 PM  Result Value Ref Range   Sodium 130 (L) 135 - 145 mmol/L   Potassium 4.5 3.5 - 5.1 mmol/L   Chloride 94 (L) 98 - 111 mmol/L   BUN 14 8 - 23 mg/dL   Creatinine, Ser 8.65 (H) 0.61 - 1.24 mg/dL   Glucose, Bld 784 (H) 70 - 99 mg/dL    Comment: Glucose reference range applies only to samples taken after fasting for at least 8 hours.   Calcium, Ion 1.09 (L) 1.15 - 1.40 mmol/L   TCO2 21 (L) 22 - 32 mmol/L   Hemoglobin 13.9 13.0 - 17.0 g/dL   HCT 69.6 29.5 - 28.4 %  I-Stat Lactic Acid, ED     Status: Abnormal   Collection Time: 06/15/23  5:53 PM  Result Value Ref Range   Lactic Acid, Venous 6.6 (HH) 0.5 - 1.9 mmol/L   Comment NOTIFIED PHYSICIAN     Imaging: I have personally reviewed and interpreted all imaging  for the past 24h and agree with the radiologist's impression. No results found.  10 point review of systems is negative except as listed above in HPI.   Physical Exam   Blood pressure 108/62, pulse 63, temperature (!) 97.2 F (36.2 C), temperature source Oral, resp. rate 18, SpO2 100%. Secondary Survey Constitutional: well-developed, well-nourished HEENT: known left pupillary abnormality, moist conjunctiva, external inspection of ears and nose normal, hearing intact Oropharynx: normal oropharyngeal mucosa Neck: no thyromegaly, trachea midline, no midline cervical tenderness to palpation CV: Regular rate and rhythm, normotensive Chest: breath sounds equal bilaterally, normal respiratory effort, no midline or lateral chest wall tenderness to palpation/deformity Abdomen: soft, NT, no bruising,  no hepatosplenomegaly GU: normal external male genitalia Back: no wounds, no thoracic/lumbar spine tenderness to palpation, no thoracic/lumbar spine stepoffs Rectal: deferred Extremities: 2+ radial and 1+ pedal pulses bilaterally, intact motor and sensation bilateral UE and LE, no peripheral edema. Approximately 3cm laceration to right wrist with no active bleeding and depth to subcutaneous fat. Approximately 2cm laceration to left wrist and 4cm laceration to left AC fossa, both hemostatic and depth to subcutaneous fat. MSK: normal gait/station, no clubbing/cyanosis of fingers/toes, normal ROM of all four extremities Skin: cool, pale, no rashes, lacerations as above Psych: normal memory, normal mood/affect  Neuro: GCS15    Assessment   Alex Underwood is an 78 y.o. male who presents as a level 1 trauma following suicide attempt with self inflicted lacerations  Known Injuries: - Lacerations to bilateral wrists and left AC fossa   Plan   - Carboxyhemoglobin level pending  - Continue 100% supplemental oxygen at this time - Given normotension and normal heart rate in trauma bay can hold on blood transfusion at this time. H/H stable. - EDP to repair lacerations - Recommend psychiatric evaluation. - No indication for trauma admission, may require medicine admission for observation given lab abnormalities and pending carboxyhemoglobin results and ultimately while awaiting psychiatric disposition.  I reviewed all pertinent labs and vitals. Discussed care directly with EDP, Dr. Wilkie Aye.  This care required moderate level of medical decision making.    Donata Duff, MD [facility]

## 2023-06-15 NOTE — Progress Notes (Signed)
 Orthopedic Tech Progress Note Patient Details:  [name] [birth year] [id number]  Level 1 trauma  Patient ID: Alex Underwood, male   DOB: 25-Sep-1945, 78 y.o.   MRN: [id number]  Alex Underwood 06/15/2023, 6:32 PM

## 2023-06-15 NOTE — ED Notes (Signed)
 2cm laceration to left wrist  4cm laceration to left [facility]

## 2023-06-15 NOTE — ED Triage Notes (Signed)
 Pt here for suicide attempt. Pt cut left and right wrist. Pt arrives with combat gauze and tourniquet.No known blood thinners. Hx of depression

## 2023-06-15 NOTE — ED Notes (Signed)
 Lysle Rubens, Trauma MD at bedside

## 2023-06-15 NOTE — ED Provider Notes (Signed)
 [place] EMERGENCY DEPARTMENT AT [facility] Provider Note   CSN: [id number] Arrival date & time: 06/15/23  1733     History  Chief Complaint  Patient presents with   Suicide Attempt    Alex Underwood is a 78 y.o. male.  HPI   78 year old male presents emergency department with attempted suicide.  Patient presents as a level 1 trauma due to multiple lacerations on the upper extremities with tourniquet in place.  Report from EMS is that patient was found sitting in a lawn chair with multiple lacerations, dark blood pooling.  Tourniquets were placed and he was transported here emergently.  On arrival patient is awake and oriented, answering questions and cooperative.  He admits to sitting in his car with it on with the correct side for about an hour and a half this morning.  He left the garage around noon and when that "did not work" he then tried to cut his bilateral forearms in an attempt to kill himself.  He states that he used a Interior and spatial designer and a Museum/gallery curator.  Does not know when his last tetanus was.  He has been compliant with his prescribed medications but denies any other ingestion.  No ethanol or other drug use.  Admits to "episodes of depression" but no previous suicide attempt.  Takes a baby aspirin daily but no blood thinners.  Home Medications Prior to Admission medications   Not on File      Allergies    Patient has no allergy information on record.    Review of Systems   Review of Systems  Unable to perform ROS: Acuity of condition    Physical Exam Updated Vital Signs BP (!) 102/58 Comment: delay in manual Bp due yto tourniquets on both arms  Pulse 66   Temp (!) 97.2 F (36.2 C) (Oral)   Resp 15   SpO2 100%  Physical Exam Vitals and nursing note reviewed.  Constitutional:      Appearance: Normal appearance. He is not diaphoretic.  HENT:     Head: Normocephalic.     Mouth/Throat:     Mouth: Mucous membranes are moist.  Eyes:     Comments: Left  pupillary abnormality that is known to the patient  Cardiovascular:     Rate and Rhythm: Normal rate.  Pulmonary:     Effort: Pulmonary effort is normal. No respiratory distress.  Abdominal:     Palpations: Abdomen is soft.     Tenderness: There is no abdominal tenderness.  Musculoskeletal:     Comments: Approximately 4 cm laceration to the right palmar wrist, tourniquet taken down and no pulsatile bleeding/active bleeding.  Approximately 4 cm laceration to the upper inner left forearm, no active bleeding.  Approximately 2 cm laceration to the left wrist, no active bleeding.  Dried blood along the hands but no other noted injury.  Skin:    General: Skin is warm.     Coloration: Skin is pale.  Neurological:     Mental Status: He is alert and oriented to person, place, and time. Mental status is at baseline.     ED Results / Procedures / Treatments   Labs (all labs ordered are listed, but only abnormal results are displayed) Labs Reviewed  COMPREHENSIVE METABOLIC PANEL WITH GFR  CBC  ETHANOL  URINALYSIS, ROUTINE W REFLEX MICROSCOPIC  PROTIME-INR  COOXEMETRY PANEL  I-STAT CHEM 8, ED  I-STAT CG4 LACTIC ACID, ED  SAMPLE TO BLOOD BANK    EKG None  Radiology No results found.  Procedures .Laceration Repair  Date/Time: 06/15/2023 9:44 PM  Performed by: Rozelle Logan, DO Authorized by: Rozelle Logan, DO   Consent:    Consent obtained:  Verbal   Consent given by:  Patient   Risks discussed:  Infection, poor cosmetic result and poor wound healing Laceration details:    Location:  Shoulder/arm   Shoulder/arm location:  R lower arm   Length (cm):  2 Pre-procedure details:    Preparation:  Patient was prepped and draped in usual sterile fashion Treatment:    Amount of cleaning:  Standard   Irrigation solution:  Sterile saline   Irrigation method:  Syringe Skin repair:    Repair method:  Sutures   Suture size:  5-0   Suture material:  Prolene   Suture technique:   Simple interrupted   Number of sutures:  8 Repair type:    Repair type:  Simple Post-procedure details:    Dressing:  Non-adherent dressing   Procedure completion:  Tolerated .Laceration Repair  Date/Time: 06/15/2023 9:49 PM  Performed by: Rozelle Logan, DO Authorized by: Rozelle Logan, DO   Consent:    Consent obtained:  Verbal   Consent given by:  Patient   Risks discussed:  Infection Universal protocol:    Patient identity confirmed:  Verbally with patient Laceration details:    Location:  Shoulder/arm   Shoulder/arm location:  L lower arm   Length (cm):  2 Treatment:    Amount of cleaning:  Standard   Irrigation solution:  Sterile saline   Irrigation method:  Syringe Skin repair:    Repair method:  Sutures   Suture size:  4-0   Suture material:  Prolene   Suture technique:  Simple interrupted   Number of sutures:  5 Approximation:    Approximation:  Close Post-procedure details:    Dressing:  Non-adherent dressing   Procedure completion:  Tolerated .Laceration Repair  Date/Time: 06/15/2023 9:49 PM  Performed by: Rozelle Logan, DO Authorized by: Rozelle Logan, DO   Consent:    Consent obtained:  Verbal   Consent given by:  Patient   Risks discussed:  Infection, poor cosmetic result and poor wound healing Laceration details:    Location:  Shoulder/arm   Shoulder/arm location:  L elbow   Length (cm):  4 Treatment:    Amount of cleaning:  Standard   Irrigation solution:  Sterile saline   Irrigation method:  Syringe Skin repair:    Repair method:  Sutures   Suture size:  5-0   Suture material:  Prolene   Suture technique:  Simple interrupted   Number of sutures:  8 Approximation:    Approximation:  Close Post-procedure details:    Dressing:  Non-adherent dressing .Critical Care  Performed by: Rozelle Logan, DO Authorized by: Rozelle Logan, DO   Critical care provider statement:    Critical care time (minutes):  75   Critical  care time was exclusive of:  Separately billable procedures and treating other patients   Critical care was necessary to treat or prevent imminent or life-threatening deterioration of the following conditions:  Trauma   Critical care was time spent personally by me on the following activities:  Development of treatment plan with patient or surrogate, discussions with consultants, evaluation of patient's response to treatment, examination of patient, ordering and review of laboratory studies, ordering and review of radiographic studies, ordering and performing treatments and interventions, pulse oximetry, re-evaluation of  patient's condition and review of old charts   I assumed direction of critical care for this patient from another provider in my specialty: no       Medications Ordered in ED Medications - No data to display  ED Course/ Medical Decision Making/ A&P                                 Medical Decision Making Amount and/or Complexity of Data Reviewed Labs: ordered.  Risk Prescription drug management.   78 year old male presents emergency department with attempted suicide.  Seen as a level 1 trauma with trauma surgeon at bedside.  Patient admits to sitting in his car while he was on with the garage closed for about an hour and when that did not work he moved outside and self-inflicted 3 lacerations to his bilateral forearms.  He arrived to tourniquets in place.  Vitals are stable on arrival.  He is alert and oriented, cooperative.  He has sustained a laceration on the right forearm as well as 2 on the left forearms, bleeding controlled, no pulsatile bleeding, neurovascularly intact.  Lacerations were repaired.  No concern for neurovascular injury.  Blood work is reassuring, carbon monoxide level is negative, EKG, hemoglobin other lab results are stable.  Vitals remained stable.  Will medically clear the patient for TTS evaluation and psychiatric  recommendation.        Final Clinical Impression(s) / ED Diagnoses Final diagnoses:  None    Rx / DC Orders ED Discharge Orders     None         Rozelle Logan, DO 06/15/23 2151

## 2023-06-15 NOTE — ED Notes (Signed)
 Right tourniquet applied at 1655 Left tourniquet applied at 1703 Combat gauzed applied to left AC wound at 1716 Right tourniquet off at 1737 Left tourniquet off at 1740

## 2023-06-16 ENCOUNTER — Encounter: Payer: Self-pay | Admitting: Psychiatry

## 2023-06-16 ENCOUNTER — Inpatient Hospital Stay
Admission: AD | Admit: 2023-06-16 | Discharge: 2023-06-22 | DRG: 885 | Disposition: A | Discharge status: Home / Self Care | Source: Intra-hospital | Attending: Psychiatry | Admitting: Psychiatry

## 2023-06-16 DIAGNOSIS — F332 Major depressive disorder, recurrent severe without psychotic features: Principal | ICD-10-CM | POA: Diagnosis present

## 2023-06-16 DIAGNOSIS — S41112A Laceration without foreign body of left upper arm, initial encounter: Secondary | ICD-10-CM | POA: Diagnosis present

## 2023-06-16 DIAGNOSIS — S51012A Laceration without foreign body of left elbow, initial encounter: Secondary | ICD-10-CM | POA: Diagnosis not present

## 2023-06-16 DIAGNOSIS — R531 Weakness: Secondary | ICD-10-CM | POA: Diagnosis present

## 2023-06-16 DIAGNOSIS — F410 Panic disorder [episodic paroxysmal anxiety] without agoraphobia: Secondary | ICD-10-CM | POA: Diagnosis present

## 2023-06-16 DIAGNOSIS — Z7982 Long term (current) use of aspirin: Secondary | ICD-10-CM | POA: Diagnosis not present

## 2023-06-16 DIAGNOSIS — S41111A Laceration without foreign body of right upper arm, initial encounter: Secondary | ICD-10-CM | POA: Diagnosis present

## 2023-06-16 DIAGNOSIS — R45851 Suicidal ideations: Secondary | ICD-10-CM | POA: Diagnosis present

## 2023-06-16 DIAGNOSIS — Z8616 Personal history of COVID-19: Secondary | ICD-10-CM

## 2023-06-16 DIAGNOSIS — Z9151 Personal history of suicidal behavior: Secondary | ICD-10-CM | POA: Diagnosis not present

## 2023-06-16 DIAGNOSIS — Z9152 Personal history of nonsuicidal self-harm: Secondary | ICD-10-CM | POA: Diagnosis not present

## 2023-06-16 DIAGNOSIS — Z79899 Other long term (current) drug therapy: Secondary | ICD-10-CM | POA: Diagnosis not present

## 2023-06-16 DIAGNOSIS — R197 Diarrhea, unspecified: Secondary | ICD-10-CM | POA: Diagnosis present

## 2023-06-16 DIAGNOSIS — Z634 Disappearance and death of family member: Secondary | ICD-10-CM

## 2023-06-16 DIAGNOSIS — Z818 Family history of other mental and behavioral disorders: Secondary | ICD-10-CM

## 2023-06-16 LAB — I-STAT CG4 LACTIC ACID, ED: Lactic Acid, Venous: 0.7 mmol/L (ref 0.5–1.9)

## 2023-06-16 LAB — SARS CORONAVIRUS 2 BY RT PCR: SARS Coronavirus 2 by RT PCR: NEGATIVE

## 2023-06-16 MED ORDER — CARVEDILOL 3.125 MG PO TABS
6.2500 mg | ORAL_TABLET | Freq: Two times a day (BID) | ORAL | Status: DC
  Administered 2023-06-16: 6.25 mg via ORAL
  Filled 2023-06-16: qty 2

## 2023-06-16 MED ORDER — ACETAMINOPHEN 325 MG PO TABS: 650.0000 mg | ORAL_TABLET | Freq: Four times a day (QID) | ORAL | Status: DC | PRN

## 2023-06-16 MED ORDER — MELATONIN 5 MG PO TABS
2.5000 mg | ORAL_TABLET | Freq: Every evening | ORAL | Status: DC | PRN
  Administered 2023-06-16 – 2023-06-21 (×6): 2.5 mg via ORAL
  Filled 2023-06-16 (×7): qty 0.5

## 2023-06-16 MED ORDER — OLANZAPINE 5 MG PO TBDP: 5.0000 mg | ORAL_TABLET | Freq: Three times a day (TID) | ORAL | Status: DC | PRN

## 2023-06-16 MED ORDER — TRAZODONE HCL 50 MG PO TABS
50.0000 mg | ORAL_TABLET | Freq: Every evening | ORAL | Status: DC | PRN
  Administered 2023-06-16 – 2023-06-17 (×2): 50 mg via ORAL
  Filled 2023-06-16 (×2): qty 1

## 2023-06-16 MED ORDER — MAGNESIUM HYDROXIDE 400 MG/5ML PO SUSP: 30.0000 mL | Freq: Every day | ORAL | Status: DC | PRN

## 2023-06-16 MED ORDER — MELATONIN 5 MG PO TABS: 2.5000 mg | ORAL_TABLET | Freq: Every evening | ORAL | Status: DC | PRN

## 2023-06-16 MED ORDER — ALUM & MAG HYDROXIDE-SIMETH 200-200-20 MG/5ML PO SUSP: 30.0000 mL | ORAL | Status: DC | PRN

## 2023-06-16 MED ORDER — OLANZAPINE 10 MG IM SOLR: 5.0000 mg | Freq: Three times a day (TID) | INTRAMUSCULAR | Status: DC | PRN

## 2023-06-16 MED ORDER — AMLODIPINE BESYLATE 5 MG PO TABS
10.0000 mg | ORAL_TABLET | Freq: Every day | ORAL | Status: DC
  Administered 2023-06-16: 10 mg via ORAL
  Filled 2023-06-16: qty 2

## 2023-06-16 NOTE — ED Notes (Signed)
 UPdate daughter when pt is going to be transferred.  Crystal: [phone]

## 2023-06-16 NOTE — Group Note (Signed)
 Recreation Therapy Group Note   Group Topic:Coping Skills  Group Date: 06/16/2023 Start Time: 1500 End Time: 1540 Facilitators: Rosina Lowenstein, LRT, CTRS Location: Courtyard  Group Description: Emotional Check in. Patient sat and talked with LRT about how they are doing and whatever else is on their mind. LRT provided active listening, reassurance and encouragement. Pts were given the opportunity to listen to music and/or color mandalas while they talk.    Goal Area(s) Addressed: Patient will engage in conversation with LRT. Patient will communicate their wants, needs, or questions.  Patient will practice a new coping skill of "talking to someone".   Affect/Mood: N/A   Participation Level: Did not attend    Clinical Observations/Individualized Feedback: Patient did not attend due to being a new admission.   Plan: Continue to engage patient in RT group sessions 2-3x/week.   Rosina Lowenstein, LRT, CTRS 06/16/2023 4:56 PM

## 2023-06-16 NOTE — ED Notes (Signed)
 Pt's demeanor is calm and cooperative at this time.   Pt ambulated very well with standby assist. He held my hand as he was a little nervous but he got up and down and did the walking on his own.   I also changed his dressings and assessed his wounds on bilateral wrists and left AC area.  All wounds are sutured, bleeding is controlled and the sites are clean, dry and intact with no signs of infection.  Pt is eating breakfast at this time.

## 2023-06-16 NOTE — Tx Team (Signed)
 Initial Treatment Plan 06/16/2023 5:37 PM [name] ZOX:[id number]    PATIENT STRESSORS: Health problems     PATIENT STRENGTHS: Ability for insight  Capable of independent living  Communication skills  General fund of knowledge  Motivation for treatment/growth  Religious Affiliation  Supportive family/friends    PATIENT IDENTIFIED PROBLEMS: exhaustion  "Over medicated"                   DISCHARGE CRITERIA:  Ability to meet basic life and health needs Improved stabilization in mood, thinking, and/or behavior Verbal commitment to aftercare and medication compliance  PRELIMINARY DISCHARGE PLAN: Return to previous living arrangement  PATIENT/FAMILY INVOLVEMENT: This treatment plan has been presented to and reviewed with the patient, Alex Underwood.  The patient and family have been given the opportunity to ask questions and make suggestions.  Lenny Pastel, RN 06/16/2023, 5:37 PM

## 2023-06-16 NOTE — ED Provider Notes (Signed)
 Emergency Medicine Observation Re-evaluation Note  Alex Underwood is a 78 y.o. male, seen on rounds today.  Pt initially presented to the ED for complaints of Suicide Attempt Currently, the patient is resting; no specific physical complaints this morning. Had some lightheaded yesterday with standing; reports resolved this am.   Physical Exam  BP 115/60 (BP Location: Right Arm)   Pulse 74   Temp 98.1 F (36.7 C) (Oral)   Resp 16   Ht 5\' 9"  (1.753 m)   Wt 106.6 kg   SpO2 99%   BMI 34.70 kg/m  Physical Exam General: NAD Cardiac: RR Lungs: non-labored  Psych: Calm  Wrists are bandaged C/D/I with no strikthrough.   ED Course / MDM  EKG:EKG Interpretation Date/Time:  Wednesday June 15 2023 18:58:09 EDT Ventricular Rate:  71 PR Interval:  181 QRS Duration:  107 QT Interval:  426 QTC Calculation: 463 R Axis:   247  Text Interpretation: Sinus rhythm Ventricular bigeminy Left anterior fascicular block Abnormal R-wave progression, late transition Confirmed by Coralee Pesa [phone]) on 06/15/2023 9:03:20 PM  I have reviewed the labs performed to date as well as medications administered while in observation.  Recent changes in the last 24 hours include accepted to [facility].  Plan  Current plan is for inpatient psych. Repeat lactic acid normalized. He is asymptomatic currently. Sounds as though he was orthostatic yesterday, slight elevation in Cr, but no prior to compare to. Continue Oral re-hydration. Stable for transfer to [facility].     Coral Spikes, DO 06/16/23 1038

## 2023-06-16 NOTE — ED Notes (Signed)
 Patient daughter Alex Underwood was given updated concerning patient transported to [facility], by safe transport. She was given the number to call the facility if she have questions concerning her father. Rn was told by daughter when patient came him yesterday in the emergency room his clothes was cut off him. Patient have is glasses on.

## 2023-06-16 NOTE — Plan of Care (Signed)
  Problem: Education: Goal: Knowledge of General Education information will improve Description: Including pain rating scale, medication(s)/side effects and non-pharmacologic comfort measures Outcome: Not Progressing   Problem: Coping: Goal: Level of anxiety will decrease Outcome: Not Progressing   Problem: Education: Goal: Knowledge of the prescribed therapeutic regimen will improve Outcome: Not Progressing   Problem: Activity: Goal: Interest or engagement in leisure activities will improve Outcome: Not Progressing  Patient new to unit.

## 2023-06-16 NOTE — Progress Notes (Signed)
 Admission Note:  Alex Underwood was admitted voluntarily from [facility] Emergency Department.  The patient was brought to the emergency room by EMS after cutting both wrists as a suicide attempt.  Patient was coopertive with admission process. Speech is tangential.  Flat affect.  Currently denies SI/HI and AVH.  Endorses depression and anxiety about entering the unit.  Denies pain.  Patient is a high fall risk due to unsteady gait.  Declined  the use of a walker on the unit.  Unable to place yellow armband on wrist due to bandages.    Patient has 2 children and 7 grandchildren that serve as his support system.  [name] reports he will likely move in with his daughter after discharge.    Patient oriented to unit.  Questions and concerns answered.  Dinner tray provided.

## 2023-06-16 NOTE — ED Notes (Addendum)
 While transporting patient to room 50 patient stated he was feeling like he was going to pass out. When this nurs egot him in the bed he stated he was ok. This nurse took vitals signs 130/80 bp and hr 80.

## 2023-06-16 NOTE — ED Notes (Signed)
 Report was given at 1337 to Orbie Hurst, RN at [facility]

## 2023-06-16 NOTE — Group Note (Signed)
 Date:  06/16/2023 Time:  10:11 PM  Group Topic/Focus:  Goals Group:   The focus of this group is to help patients establish daily goals to achieve during treatment and discuss how the patient can incorporate goal setting into their daily lives to aide in recovery.    Participation Level:  Active  Participation Quality:  Appropriate  Affect:  Appropriate  Cognitive:  Appropriate  Insight: Good  Engagement in Group:  Engaged  Modes of Intervention:  Discussion  Additional Comments:    Burt Ek 06/16/2023, 10:11 PM

## 2023-06-16 NOTE — BH Assessment (Addendum)
 Comprehensive Clinical Assessment (CCA) Note  06/16/2023 Alex Underwood [id number] Disposition: Clinician discussed patient care with Sindy Guadeloupe, NP.  He recommended inpatient gero psych placement.  Clinician informed RN Gabriel Rung of disposition recommendation via secure messaging.  Pt has a flat affect throughout assessment.  Patient has fair eye contact.  He is raising his left arm up during assessment and examining his wound.  Pt speaks in a normal tone, slowly.  He reports getting poor sleep over the last two nights.  Appetite is WNL.  Pt has no current outpatient care.  He has had 2-3 previous psychiatric hospitalizations.   Chief Complaint:  Chief Complaint  Patient presents with   Suicide Attempt   Visit Diagnosis: MDD recurrent, severe    CCA Screening, Triage and Referral (STR)  Patient Reported Information How did you hear about [place]? Other (Comment) (EMS)  What Is the Reason for Your Visit/Call Today? Around 10am today (04/02) pt had put his car in the garage with the motor running for about 1 to 1.5 hours.  He had intended to die by carbon monoxide poisoning.  When this did not seem to be happening he opened the garage door and went to get a large kitchen knife to cut himself.  He did not have much success with that so he got a utility knife.  He has one cut on his right arm and two cuts on left arm.  He crawled from the garage to the driveway and laid on his back and called to his neighbor who then called EMS.  Pt idenfies stressor of being sick for two months with a sinus infection; high blood pressure.  Pt says that his depression got worse.  Pt says that he had a girlfriend who he asked to go back to her home.  This was a week ago.  Pt says that his depression and blood pressure did not improve, he moved in with his daughter and his mood and BP improved.  For the last  few days his depression was getting worse and he today tried to end his life.  Patient says that he has not tried to  kill himself before.  His last psychiatric hospitalization was in 2012.  Pt denies any HI or A/V hallucinations.  Pt denies use of ETOH and THC.  Patient denies any outpatint care.  Patient denies any access to weapons.  sleep has not been good for the last two nights.  He reports eating is okay.  Pt feels like his medications don't seem to work.  How Long Has This Been Causing You Problems? > than 6 months  What Do You Feel Would Help You the Most Today? Treatment for Depression or other mood problem   Have You Recently Had Any Thoughts About Hurting Yourself? Yes  Are You Planning to Commit Suicide/Harm Yourself At This time? Yes   Flowsheet Row ED from [facility]/15/20[facility] in [facility] Emergency Department at [facility]  C-SSRS RISK CATEGORY High Risk       Have you Recently Had Thoughts About Hurting Someone Karolee Ohs? No  Are You Planning to Harm Someone at This Time? No  Explanation: Pt attempted to kill himself today.  No HI.   Have You Used Any Alcohol or Drugs in the Past 24 Hours? No  How Long Ago Did You Use Drugs or Alcohol? No data recorded What Did You Use and How Much? No data recorded  Do You Currently Have a Therapist/Psychiatrist? No  Name of Therapist/Psychiatrist:  Have You Been Recently Discharged From Any Office Practice or Programs? No  Explanation of Discharge From Practice/Program: No data recorded    CCA Screening Triage Referral Assessment Type of Contact: Tele-Assessment  Telemedicine Service Delivery:   Is this Initial or Reassessment? Is this Initial or Reassessment?: Initial Assessment  Date Telepsych consult ordered in [facility]:  Date Telepsych consult ordered in [facility]: 06/15/23  Time Telepsych consult ordered in [facility]:  Time Telepsych consult ordered in [facility]: 2103  Location of Assessment: [facility] ED  Provider Location: [facility] Assessment Services   Collateral Involvement: None   Does Patient Have a Automotive engineer Guardian? No  Legal  Guardian Contact Information: Pt has no legal guardian.  Copy of Legal Guardianship Form: -- (Pt has no legal guardian.)  Legal Guardian Notified of Arrival: -- (Pt has no legal guardian.)  Legal Guardian Notified of Pending Discharge: -- (Pt has no legal guardian.)  If Minor and Not Living with Parent(s), Who has Custody? Pt is an elderly adult.  Is CPS involved or ever been involved? Never  Is APS involved or ever been involved? Never   Patient Determined To Be At Risk for Harm To Self or Others Based on Review of Patient Reported Information or Presenting Complaint? Yes, for Self-Harm  Method: Plan with intent and identified person (Pt attempted to kill himself utilizing two methods today.)  Availability of Means: In hand or used  Intent: Clearly intends on inflicting harm that could cause death (Pt attempted to die by carbon monoxide poisoning and cutting his wrists.)  Notification Required: No need or identified person  Additional Information for Danger to Others Potential: -- (No HI.)  Additional Comments for Danger to Others Potential: Pt denies any HI.  Are There Guns or Other Weapons in Your Home? No  Types of Guns/Weapons: None  Are These Weapons Safely Secured?                            No  Who Could Verify You Are Able To Have These Secured: Daughter does have a gun safe at her home.  Do You Have any Outstanding Charges, Pending Court Dates, Parole/Probation? None  Contacted To Inform of Risk of Harm To Self or Others: Other: Comment (Intended to kill himself.)    Does Patient Present under Involuntary Commitment? No    County of Residence: [place]   Patient Currently Receiving the Following Services: Not Receiving Services   Determination of Need: Emergent (2 hours)   Options For Referral: Inpatient Hospitalization (Gero psych per Sindy Guadeloupe, NP.)     CCA Biopsychosocial Patient Reported Schizophrenia/Schizoaffective Diagnosis in Past:  No   Strengths: "I'm a good Arboriculturist.   Mental Health Symptoms Depression:  Difficulty Concentrating; Fatigue; Hopelessness; Worthlessness; Sleep (too much or little); Change in energy/activity   Duration of Depressive symptoms: Duration of Depressive Symptoms: Greater than two weeks   Mania:  None   Anxiety:   Difficulty concentrating; Tension; Worrying   Psychosis:  None   Duration of Psychotic symptoms:    Trauma:  Emotional numbing; Guilt/shame   Obsessions:  None   Compulsions:  None   Inattention:  N/A   Hyperactivity/Impulsivity:  N/A   Oppositional/Defiant Behaviors:  N/A   Emotional Irregularity:  Chronic feelings of emptiness   Other Mood/Personality Symptoms:  MDD    Mental Status Exam Appearance and self-care  Stature:  Average   Weight:  Average weight  Clothing:  Casual (Scrubs)   Grooming:  Normal   Cosmetic use:  None   Posture/gait:  Normal   Motor activity:  Not Remarkable   Sensorium  Attention:  Normal   Concentration:  Scattered   Orientation:  X5   Recall/memory:  Normal   Affect and Mood  Affect:  Blunted; Flat; Depressed   Mood:  Depressed   Relating  Eye contact:  Fleeting   Facial expression:  Depressed; Sad   Attitude toward examiner:  Cooperative   Thought and Language  Speech flow: Clear and Coherent; Normal   Thought content:  Appropriate to Mood and Circumstances   Preoccupation:  None   Hallucinations:  None   Organization:  Coherent; Goal-directed; Development worker, international aid of Knowledge:  Average   Intelligence:  Average   Abstraction:  Normal   Judgement:  Poor   Reality Testing:  Adequate   Insight:  Gaps; Poor   Decision Making:  Impulsive   Social Functioning  Social Maturity:  Impulsive   Social Judgement:  Naive   Stress  Stressors:  Grief/losses; Illness   Coping Ability:  Deficient supports; Overwhelmed   Skill Deficits:  Self-control;  Decision making   Supports:  Family     Religion: Religion/Spirituality Are You A Religious Person?: Yes What is Your Religious Affiliation?: Christian How Might This Affect Treatment?: No affect on treatment  Leisure/Recreation: Leisure / Recreation Do You Have Hobbies?: Yes Leisure and Hobbies: Photography  Exercise/Diet: Exercise/Diet Do You Exercise?: Yes What Type of Exercise Do You Do?: Other (Comment) (Uses a "total gym" at his house. Does Tai [employer]) How Many Times a Week Do You Exercise?: Daily Have You Gained or Lost A Significant Amount of Weight in the Past Six Months?: Yes-Lost Number of Pounds Lost?: 6 Do You Follow a Special Diet?: No Do You Have Any Trouble Sleeping?: Yes Explanation of Sleeping Difficulties: Has not slept well in the last two nights.   CCA Employment/Education Employment/Work Situation: Employment / Work Systems developer: Retired Passenger transport manager has Been Impacted by Current Illness: No Has Patient ever Been in Equities trader?: No  Education: Education Is Patient Currently Attending School?: No Last Grade Completed: 16 Did You Product manager?: Yes What Type of College Degree Do you Have?: BS in accounting Did You Have An Individualized Education Program (IIEP): No Did You Have Any Difficulty At School?: No Patient's Education Has Been Impacted by Current Illness: No   CCA Family/Childhood History Family and Relationship History: Family history Marital status: Widowed Widowed, when?: 2022 Does patient have children?: Yes How many children?: 2 How is patient's relationship with their children?: Good relationship.  Childhood History:  Childhood History By whom was/is the patient raised?: Both parents Did patient suffer any verbal/emotional/physical/sexual abuse as a child?: No Did patient suffer from severe childhood neglect?: No Has patient ever been sexually abused/assaulted/raped as an adolescent or adult?: No Was  the patient ever a victim of a crime or a disaster?: No Witnessed domestic violence?: No Has patient been affected by domestic violence as an adult?: No       CCA Substance Use Alcohol/Drug Use: Alcohol / Drug Use Pain Medications: None Prescriptions: See MAR Over the Counter: Vitamins History of alcohol / drug use?: No history of alcohol / drug abuse (Has not had any ETOH in over 20 years.) Withdrawal Symptoms: None  ASAM's:  Six Dimensions of Multidimensional Assessment  Dimension 1:  Acute Intoxication and/or Withdrawal Potential:      Dimension 2:  Biomedical Conditions and Complications:      Dimension 3:  Emotional, Behavioral, or Cognitive Conditions and Complications:     Dimension 4:  Readiness to Change:     Dimension 5:  Relapse, Continued use, or Continued Problem Potential:     Dimension 6:  Recovery/Living Environment:     ASAM Severity Score:    ASAM Recommended Level of Treatment:     Substance use Disorder (SUD)    Recommendations for Services/Supports/Treatments:    Disposition Recommendation per psychiatric provider: We recommend inpatient psychiatric hospitalization when medically cleared. Patient is under voluntary admission status at this time; please IVC if attempts to leave hospital.   DSM5 Diagnoses: There are no active problems to display for this patient.    Referrals to Alternative Service(s): Referred to Alternative Service(s):   Place:   Date:   Time:    Referred to Alternative Service(s):   Place:   Date:   Time:    Referred to Alternative Service(s):   Place:   Date:   Time:    Referred to Alternative Service(s):   Place:   Date:   Time:     Wandra Mannan

## 2023-06-16 NOTE — Progress Notes (Signed)
 Pt was accepted to [facility] 06/16/2023 Bed Assignment  36  Address: [address] [place], [place], [place] [address]  -[facility] Fax: [phone]  Pt meets inpatient criteria per: Arsenio Loader NP  Attending Physician will be Dr. Callie Fielding  Report can be called to: [phone]  Pt can arrive after [facility] WILL UPDATE   Care Team notified: Malva Limes RN, Arsenio Loader NP, Richelle Ito RN, Jorene Minors RN,   [place] Youlanda Tomassetti MSW, [facility] 06/16/2023 10:40 AM

## 2023-06-17 DIAGNOSIS — F332 Major depressive disorder, recurrent severe without psychotic features: Secondary | ICD-10-CM | POA: Diagnosis not present

## 2023-06-17 MED ORDER — CARVEDILOL 6.25 MG PO TABS
6.2500 mg | ORAL_TABLET | Freq: Two times a day (BID) | ORAL | Status: DC
  Filled 2023-06-17 (×2): qty 1

## 2023-06-17 MED ORDER — AMLODIPINE BESYLATE 5 MG PO TABS
10.0000 mg | ORAL_TABLET | Freq: Every day | ORAL | Status: DC
  Filled 2023-06-17: qty 2

## 2023-06-17 NOTE — Group Note (Signed)
 Recreation Therapy Group Note   Group Topic:Leisure Education  Group Date: 06/17/2023 Start Time: 1500 End Time: 1600 Facilitators: Rosina Lowenstein, LRT, CTRS Location: Courtyard  Group Description: Music. Patients encouraged to name their favorite song(s) for LRT to play song through speaker for group to hear. Patient educated on the definition of leisure and the importance of having different leisure interests outside of the hospital. Group discussed how leisure activities can often be used as Pharmacologist and that listening to music is one example.   Goal Area(s) Addressed:  Patient will identify a current leisure interest.  Patient will practice making a positive decision. Patient will have the opportunity to try a new leisure activity   Affect/Mood: Appropriate   Participation Level: Active and Engaged   Participation Quality: Independent   Behavior: Calm and Cooperative   Speech/Thought Process: Coherent   Insight: Good   Judgement: Good   Modes of Intervention: Education, Exploration, and Music   Patient Response to Interventions:  Attentive, Engaged, and Receptive   Education Outcome:  Acknowledges education   Clinical Observations/Individualized Feedback: [name] was active in their participation of session activities and group discussion. Pt came late to group, however, joined with no issue. Pt shared that he did not know there were so many resources for mental health. Pt also talked about his family and was sharing how successful and proud he was of all of his grandchildren. Pt interacted well with LRT and peers duration of session.    Plan: Continue to engage patient in RT group sessions 2-3x/week.   Rosina Lowenstein, LRT, CTRS 06/17/2023 4:34 PM

## 2023-06-17 NOTE — H&P (Signed)
 Psychiatric Admission Assessment Adult  Patient Identification: Alex Underwood MRN:  [id number] Date of Evaluation:  06/17/2023 Chief Complaint:  MDD (major depressive disorder), recurrent severe, without psychosis (HCC) [F33.2]   History of Present Illness: 78 year old male presents emergency department with attempted suicide. Patient presents as a level 1 trauma due to multiple lacerations on the upper extremities with tourniquet in place. Report from EMS is that patient was found sitting in a lawn chair with multiple lacerations, dark blood pooling. Tourniquets were placed and he was transported here emergently. Patient is admitted to [facility] unit with Q15 min safety monitoring. Multidisciplinary team approach is offered. Medication management; group/milieu therapy is offered.   On interview patient talks about going through some stressors, having high blood pressure, his heart medications being changed causing high blood pressure that has caused him problems.  Provider directed the patient to discuss more about his emotions, stressors and the recent lethal attempt of suicide by carbon monoxide poisoning and cutting his wrists.  He eventually acknowledged that he has been having severe depression in the last 2 weeks.  He talks about his wife who passed away in 15-Jul-2018 and who had been a rocksolid support for him all his life.  After she died he started feeling only.  He had a girlfriend until 2 weeks before and they lived together for 2 years.  Patient reports not having any emotional problems when she had the girlfriend.  But in the last 2 weeks he started getting very irritable with her as she has hard of hearing and 1 week ago he dropped her off at her house and started living alone.  Patient reports worsening depression, hopelessness and helplessness, anhedonia, poor sleep and appetite the last 10 days.  He reports that he called his daughter and went to stay with her for few days and when he started  feeling better came back home.  He reports worsening suicidal ideations in the last 2 weeks stating that he sat in his bathtub with a knife in his hand 2 weeks ago but then states "GOD help me to put the knife away".  He reports having anxiety and panic attacks.  He denies any history of abuse, denies nightmares and flashbacks.  He denies recent or previous episodes of mania/hypomania.  He is not displaying any grandiose delusions or any other overt delusions.  He continues to minimize his current presentation throughout the interview stating that since he came to the emergency room and had good night sleep his suicidal thoughts went away and he is fine now to go back home.  He denies auditory/visual hallucinations.  He reports taking Pristiq 50 mg which helped with his depression but recently his cardiologist reportedly increased his dose to 100 mg and patient feels that his depression got worse on higher dose.  Patient is very insistent that he wants to get back on his home medication Pristiq 50 mg.  Provider discussed that his family can bring his home medication which can be added as nonformulary medication.  Total Time spent with patient: 1 hour Sleep  Sleep:Sleep: Fair  Past Psychiatric History:  Psychiatric History:  Information collected from patient  Prev Dx/Sx: Depression and anxiety Current Psych Provider: Unable to recall but looks like PCP is managing the medications Home Meds (current): Pristiq 50 mg Previous Med Trials: Unable to recall Therapy: None reported  Prior Psych Hospitalization: Few years ago unable to recall the details Prior Self Harm: None reported, last suicide attempt was 2 weeks  ago before this plan as documented in H&P Prior Violence: None reported  Family Psych History: Multiple family members with depression and anxiety and alcohol use Family Hx suicide: His brother died by suicide  Social History:  Developmental Hx: Normal Educational Hx: 4 years of  college Occupational Hx: Retired Armed forces operational officer Hx: None reported Living Situation: Lives by himself and has 2 daughters Spiritual Hx: None reported Access to weapons/lethal means: Denies  Substance History 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 Yes.    Has the patient been a risk to self in the past 6 months? Yes.    Has the patient been a risk to self within the distant past? No.  Is the patient a risk to others? No.  Has the patient been a risk to others in the past 6 months? No.  Has the patient been a risk to others within the distant past? No.   [place] Scale:  Flowsheet Row Admission (Current) from 06/16/2023 in [facility] ED from 06/15/2023 in [facility] Emergency Department at [facility]  C-SSRS RISK CATEGORY High Risk High Risk        Past Medical History: History reviewed. No pertinent past medical history. History reviewed. No pertinent surgical history. Family History: History reviewed. No pertinent family history.  Social History:  Social History   Substance and Sexual Activity  Alcohol Use None     Social History   Substance and Sexual Activity  Drug Use Not on file      Allergies:  Not on File Lab Results:  Results for orders placed or performed during the hospital encounter of 06/15/23 (from the past 48 hours)  Urinalysis, Routine w reflex microscopic -Urine, Clean Catch     Status: Abnormal   Collection Time: 06/15/23  5:37 PM  Result Value Ref Range   Color, Urine YELLOW YELLOW   APPearance CLEAR CLEAR   Specific Gravity, Urine 1.014 1.005 - 1.030   pH 7.0 5.0 - 8.0   Glucose, UA NEGATIVE NEGATIVE mg/dL   Hgb urine dipstick NEGATIVE NEGATIVE   Bilirubin Urine NEGATIVE NEGATIVE   Ketones, ur 20 (A) NEGATIVE mg/dL   Protein, ur NEGATIVE NEGATIVE mg/dL   Nitrite NEGATIVE NEGATIVE   Leukocytes,Ua NEGATIVE  NEGATIVE    Comment: Performed at [facility] Lab, [address] [place], [place] [address]  Urine rapid drug screen (hosp performed)     Status: None   Collection Time: 06/15/23  5:37 PM  Result Value Ref Range   Opiates NONE DETECTED NONE DETECTED   Cocaine NONE DETECTED NONE DETECTED   Benzodiazepines NONE DETECTED NONE DETECTED   Amphetamines NONE DETECTED NONE DETECTED   Tetrahydrocannabinol NONE DETECTED NONE DETECTED   Barbiturates NONE DETECTED NONE DETECTED    Comment: (NOTE) DRUG SCREEN FOR MEDICAL PURPOSES ONLY.  IF CONFIRMATION IS NEEDED FOR ANY PURPOSE, NOTIFY LAB WITHIN 5 DAYS.  LOWEST DETECTABLE LIMITS FOR URINE DRUG SCREEN Drug Class                     Cutoff (ng/mL) Amphetamine and metabolites    1000 Barbiturate and metabolites    200 Benzodiazepine                 200 Opiates and metabolites        300 Cocaine and metabolites  300 THC                            50 Performed at [facility], [address] [place], [place] [address]   Comprehensive metabolic panel     Status: Abnormal   Collection Time: 06/15/23  5:44 PM  Result Value Ref Range   Sodium 131 (L) 135 - 145 mmol/L   Potassium 4.5 3.5 - 5.1 mmol/L   Chloride 94 (L) 98 - 111 mmol/L   CO2 21 (L) 22 - 32 mmol/L   Glucose, Bld 202 (H) 70 - 99 mg/dL    Comment: Glucose reference range applies only to samples taken after fasting for at least 8 hours.   BUN 14 8 - 23 mg/dL   Creatinine, Ser 6.04 (H) 0.61 - 1.24 mg/dL   Calcium 9.4 8.9 - 54.0 mg/dL   Total Protein 7.3 6.5 - 8.1 g/dL   Albumin 4.0 3.5 - 5.0 g/dL   AST 39 15 - 41 U/L   ALT 36 0 - 44 U/L   Alkaline Phosphatase 40 38 - 126 U/L   Total Bilirubin 1.1 0.0 - 1.2 mg/dL   GFR, Estimated 45 (L) >60 mL/min    Comment: (NOTE) Calculated using the CKD-EPI Creatinine Equation (2021)    Anion gap 16 (H) 5 - 15    Comment: Performed at [facility], [address] [place], [place] [address]  CBC     Status: Abnormal    Collection Time: 06/15/23  5:44 PM  Result Value Ref Range   WBC 11.0 (H) 4.0 - 10.5 K/uL   RBC 4.24 4.22 - 5.81 MIL/uL   Hemoglobin 13.3 13.0 - 17.0 g/dL   HCT 14.7 (L) 82.9 - 56.2 %   MCV 90.3 80.0 - 100.0 fL   MCH 31.4 26.0 - 34.0 pg   MCHC 34.7 30.0 - 36.0 g/dL   RDW 13.0 86.5 - 78.4 %   Platelets 305 150 - 400 K/uL   nRBC 0.0 0.0 - 0.2 %    Comment: Performed at [facility], [address] [place], [place] [address]  Protime-INR     Status: None   Collection Time: 06/15/23  5:44 PM  Result Value Ref Range   Prothrombin Time 15.0 11.4 - 15.2 seconds   INR 1.2 0.8 - 1.2    Comment: (NOTE) INR goal varies based on device and disease states. Performed at [facility], [address] [place], [place] [address]   I-Stat Chem 8, ED     Status: Abnormal   Collection Time: 06/15/23  5:53 PM  Result Value Ref Range   Sodium 130 (L) 135 - 145 mmol/L   Potassium 4.5 3.5 - 5.1 mmol/L   Chloride 94 (L) 98 - 111 mmol/L   BUN 14 8 - 23 mg/dL   Creatinine, Ser 3.24 (H) 0.61 - 1.24 mg/dL   Glucose, Bld 401 (H) 70 - 99 mg/dL    Comment: Glucose reference range applies only to samples taken after fasting for at least 8 hours.   Calcium, Ion 1.09 (L) 1.15 - 1.40 mmol/L   TCO2 21 (L) 22 - 32 mmol/L   Hemoglobin 13.9 13.0 - 17.0 g/dL   HCT 02.7 25.3 - 66.4 %  I-Stat Lactic Acid, ED     Status: Abnormal   Collection Time: 06/15/23  5:53 PM  Result Value Ref Range   Lactic  Acid, Venous 6.6 (HH) 0.5 - 1.9 mmol/L   Comment NOTIFIED PHYSICIAN   Ethanol     Status: None   Collection Time: 06/15/23  5:59 PM  Result Value Ref Range   Alcohol, Ethyl (B) <10 <10 mg/dL    Comment: (NOTE) Lowest detectable limit for serum alcohol is 10 mg/dL.  For medical purposes only. Performed at [facility] Lab, [address], [place], [place] [address]   Acetaminophen level     Status: Abnormal   Collection Time: 06/15/23  5:59 PM  Result Value Ref Range   Acetaminophen (Tylenol),  Serum <10 (L) 10 - 30 ug/mL    Comment: (NOTE) Therapeutic concentrations vary significantly. A range of 10-30 ug/mL  may be an effective concentration for many patients. However, some  are best treated at concentrations outside of this range. Acetaminophen concentrations >150 ug/mL at 4 hours after ingestion  and >50 ug/mL at 12 hours after ingestion are often associated with  toxic reactions.  Performed at [facility], [address] [place], [place] [address]   Salicylate level     Status: Abnormal   Collection Time: 06/15/23  5:59 PM  Result Value Ref Range   Salicylate Lvl <7.0 (L) 7.0 - 30.0 mg/dL    Comment: Performed at [facility], [address] [place], [place] [address]  I-Stat venous blood gas, ED     Status: Abnormal   Collection Time: 06/15/23  6:12 PM  Result Value Ref Range   pH, Ven 7.284 7.25 - 7.43   pCO2, Ven 48.9 44 - 60 mmHg   pO2, Ven 36 32 - 45 mmHg   Bicarbonate 23.2 20.0 - 28.0 mmol/L   TCO2 25 22 - 32 mmol/L   O2 Saturation 62 %   Acid-base deficit 4.0 (H) 0.0 - 2.0 mmol/L   Sodium 130 (L) 135 - 145 mmol/L   Potassium 4.5 3.5 - 5.1 mmol/L   Calcium, Ion 1.11 (L) 1.15 - 1.40 mmol/L   HCT 40.0 39.0 - 52.0 %   Hemoglobin 13.6 13.0 - 17.0 g/dL   Sample type VENOUS    Comment NOTIFIED PHYSICIAN   .Cooxemetry Panel (carboxy, met, total hgb, O2 sat)(NOT AT [facility] or [facility])     Status: None   Collection Time: 06/15/23  6:41 PM  Result Value Ref Range   Total hemoglobin 12.7 12.0 - 16.0 g/dL   O2 Saturation 21.3 %   Carboxyhemoglobin 1.0 0.5 - 1.5 %   Methemoglobin <0.7 0.0 - 1.5 %    Comment: Performed at [facility], [address], [place], [place] [address]  Troponin I (High Sensitivity)     Status: None   Collection Time: 06/15/23  9:01 PM  Result Value Ref Range   Troponin I (High Sensitivity) 15 <18 ng/L    Comment: (NOTE) Elevated high sensitivity troponin I (hsTnI) values and significant  changes across serial measurements  may suggest ACS but many other  chronic and acute conditions are known to elevate hsTnI results.  Refer to the "Links" section for chest pain algorithms and additional  guidance. Performed at [facility], [address], [place], [place] [address]   SARS Coronavirus 2 by RT PCR (hospital order, performed in [facility] hospital lab) *cepheid single result test* Anterior Nasal Swab     Status: None   Collection Time: 06/16/23  8:34 AM   Specimen: Anterior Nasal Swab  Result Value Ref Range   SARS  Coronavirus 2 by RT PCR NEGATIVE NEGATIVE    Comment: Performed at [facility] Lab, [address] [place], [place] [address]  I-Stat Lactic Acid     Status: None   Collection Time: 06/16/23  9:20 AM  Result Value Ref Range   Lactic Acid, Venous 0.7 0.5 - 1.9 mmol/L    Blood Alcohol level:  Lab Results  Component Value Date   ETH <10 06/15/2023    Metabolic Disorder Labs:  No results found for: "HGBA1C", "MPG" No results found for: "PROLACTIN" No results found for: "CHOL", "TRIG", "HDL", "CHOLHDL", "VLDL", "LDLCALC"  Current Medications: Current Facility-Administered Medications  Medication Dose Route Frequency Provider Last Rate Last Admin   acetaminophen (TYLENOL) tablet 650 mg  650 mg Oral Q6H PRN Lenox Ponds, NP       alum & mag hydroxide-simeth (MAALOX/MYLANTA) 200-200-20 MG/5ML suspension 30 mL  30 mL Oral Q4H PRN Lenox Ponds, NP       magnesium hydroxide (MILK OF MAGNESIA) suspension 30 mL  30 mL Oral Daily PRN Lenox Ponds, NP       melatonin tablet 2.5 mg  2.5 mg Oral QHS PRN Onuoha, Chinwendu V, NP   2.5 mg at 06/16/23 2251   OLANZapine (ZYPREXA) injection 5 mg  5 mg Intramuscular TID PRN Lenox Ponds, NP       OLANZapine zydis (ZYPREXA) disintegrating tablet 5 mg  5 mg Oral TID PRN Lenox Ponds, NP       traZODone (DESYREL) tablet 50 mg  50 mg Oral QHS PRN Onuoha, Chinwendu V, NP   50 mg at 06/16/23 2251   PTA Medications: Medications Prior  to Admission  Medication Sig Dispense Refill Last Dose/Taking   acetaminophen (TYLENOL) 500 MG tablet Take 500 mg by mouth every 6 (six) hours as needed for mild pain (pain score 1-3) or headache.      amLODipine (NORVASC) 10 MG tablet Take 10 mg by mouth daily.      aspirin EC 81 MG tablet Take 81 mg by mouth daily. Swallow whole.      carvedilol (COREG) 6.25 MG tablet Take 6.25 mg by mouth 2 (two) times daily.      desvenlafaxine (PRISTIQ) 100 MG 24 hr tablet Take 100 mg by mouth daily.      testosterone (ANDROGEL) 50 MG/5GM (1%) GEL Place 5 g onto the skin daily. Apply topically to abdomen daily.      traZODone (DESYREL) 150 MG tablet Take 150 mg by mouth at bedtime.       Psychiatric Specialty Exam:  Presentation  General Appearance: Appropriate for Environment; Casual  Eye Contact:Fair  Speech:Clear and Coherent  Speech Volume:Normal    Mood and Affect  Mood:Depressed; Anxious; Hopeless  Affect:Depressed; Flat   Thought Process  Thought Processes:Coherent  Descriptions of Associations:Intact  Orientation:Full (Time, Place and Person)  Thought Content:Logical  Hallucinations:Hallucinations: None  Ideas of Reference:None  Suicidal Thoughts:Suicidal Thoughts: Yes, Active SI Active Intent and/or Plan: Without Intent; Without Plan  Homicidal Thoughts:Homicidal Thoughts: No   Sensorium  Memory:Immediate Fair; Recent Fair; Remote Fair  Judgment:Impaired  Insight:Shallow   Executive Functions  Concentration:Fair  Attention Span:Fair  Recall:Fair  Fund of Knowledge:Fair  Language:Fair   Psychomotor Activity  Psychomotor Activity:Psychomotor Activity: Normal   Assets  Assets:Communication Skills; Desire for Improvement; Physical Health    Musculoskeletal: Strength & Muscle Tone: within normal limits Gait & Station: normal  Physical Exam: Physical Exam Vitals and nursing note reviewed.  HENT:  Head: Normocephalic.     Nose: Nose  normal.     Mouth/Throat:     Mouth: Mucous membranes are moist.  Eyes:     Conjunctiva/sclera: Conjunctivae normal.  Cardiovascular:     Rate and Rhythm: Normal rate.     Pulses: Normal pulses.  Abdominal:     General: Bowel sounds are normal.  Skin:    General: Skin is warm.  Neurological:     General: No focal deficit present.     Mental Status: He is alert.    Review of Systems  Constitutional: Negative.   HENT: Negative.    Eyes: Negative.   Cardiovascular: Negative.   Gastrointestinal: Negative.   Skin: Negative.   Neurological: Negative.    Blood pressure (!) 115/50, pulse 71, temperature 98.2 F (36.8 C), resp. rate 17, height 5\' 9"  (1.753 m), weight 98 kg, SpO2 100%. Body mass index is 31.9 kg/m.  Principal Diagnosis: MDD (major depressive disorder), recurrent severe, without psychosis (HCC) Diagnosis:  Principal Problem:   MDD (major depressive disorder), recurrent severe, without psychosis (HCC)   Clinical Decision Making: Patient with history of depression and anxiety in the context of multiple social stressors, widowed 2 years ago, recently broke up with girlfriend of 2 years, living alone, not having any support at home when he lives alone, recent multiple attempts of suicide in the last 2 weeks with intention to die, family history of his brother died by suicide.  Patient is a high risk for suicide and needs inpatient hospitalization for stabilization  Treatment Plan Summary:  Safety and Monitoring:             -- Voluntary admission to inpatient psychiatric unit for safety, stabilization and treatment             -- Daily contact with patient to assess and evaluate symptoms and progress in treatment             -- Patient's case to be discussed in multi-disciplinary team meeting             -- Observation Level: q15 minute checks             -- Vital signs:  q12 hours             -- Precautions: suicide, elopement, and assault   2. Psychiatric Diagnoses  and Treatment:               Family will bring home medication Pristiq 50 mg daily   -- The risks/benefits/side-effects/alternatives to this medication were discussed in detail with the patient and time was given for questions. The patient consents to medication trial.                -- Metabolic profile and EKG monitoring obtained while on an atypical antipsychotic (BMI: Lipid Panel: HbgA1c: QTc:)              -- Encouraged patient to participate in unit milieu and in scheduled group therapies                            3. Medical Issues Being Addressed:    Resumed his home blood pressure medication 4. Discharge Planning:              -- Social work and case management to assist with discharge planning and identification of hospital follow-up needs prior to discharge             --  Estimated LOS: 5-7 days             -- Discharge Concerns: Need to establish a safety plan; Medication compliance and effectiveness             -- Discharge Goals: Return home with outpatient referrals follow ups  Physician Treatment Plan for Primary Diagnosis: MDD (major depressive disorder), recurrent severe, without psychosis (HCC) Long Term Goal(s): Improvement in symptoms so as ready for discharge  Short Term Goals: Ability to identify changes in lifestyle to reduce recurrence of condition will improve, Ability to verbalize feelings will improve, Ability to disclose and discuss suicidal ideas, Ability to demonstrate self-control will improve, and Ability to identify and develop effective coping behaviors will improve  Physician Treatment Plan for Secondary Diagnosis: Principal Problem:   MDD (major depressive disorder), recurrent severe, without psychosis (HCC)  Long Term Goal(s): Improvement in symptoms so as ready for discharge  Short Term Goals: Ability to identify changes in lifestyle to reduce recurrence of condition will improve, Ability to verbalize feelings will improve, Ability to disclose and  discuss suicidal ideas, Ability to demonstrate self-control will improve, Ability to identify and develop effective coping behaviors will improve, Ability to maintain clinical measurements within normal limits will improve, Compliance with prescribed medications will improve, and Ability to identify triggers associated with substance abuse/mental health issues will improve  I certify that inpatient services furnished can reasonably be expected to improve the patient's condition.    Verner Chol, MD 4/4/20253:55 PM

## 2023-06-17 NOTE — Group Note (Signed)
 Therapy Group Note  Group Topic:Other  Group Date: 06/17/2023 Start Time: 1300 End Time: 1331 Facilitators: Soriah Leeman, Thomes Dinning, PT     Group Description: Group educated on sequence and techniques to maximize safety with functional transfers.  Additionally, integrated education on impact of seating surfaces, use of assistive device and management of orthostasis with movement transitions.  Patients actively engaged with functional transfers (sit/stand) from various seating surfaces, with and without assist devices, working to integrate and retain education provided during session.  Allowed time for questions and further discussion on mobility concerns/needs.   Therapeutic Goal(s): Identify and demonstrate safe technique for sit/stand transfers from various seating surfaces. Identify and demonstrate safe use of assistive devices with basic transfers and simple mobility. Identify and demonstrate ability to recognize signs/symptoms of orthostasis and appropriate compensatory/safety techniques.  Individual Participation: Pt did not attend      Participation Level:   Participation Quality:   Behavior:   Speech/Thought Process:   Affect/Mood:   Insight:   Judgement:   Individualization:   Modes of Intervention:   Patient Response to Interventions:    Plan: Continue to engage patient in OT groups 1 - 2x/week.  Ovidio Hanger PT, DPT 06/17/23, 2:48 PM

## 2023-06-17 NOTE — BH IP Treatment Plan (Signed)
 Interdisciplinary Treatment and Diagnostic Plan Update  06/17/2023 Time of Session: 1:54 PM  Alex Underwood MRN: [id number]  Principal Diagnosis: MDD (major depressive disorder), recurrent severe, without psychosis (HCC)  Secondary Diagnoses: Principal Problem:   MDD (major depressive disorder), recurrent severe, without psychosis (HCC)   Current Medications:  Current Facility-Administered Medications  Medication Dose Route Frequency Provider Last Rate Last Admin   acetaminophen (TYLENOL) tablet 650 mg  650 mg Oral Q6H PRN Lenox Ponds, NP       alum & mag hydroxide-simeth (MAALOX/MYLANTA) 200-200-20 MG/5ML suspension 30 mL  30 mL Oral Q4H PRN Lenox Ponds, NP       magnesium hydroxide (MILK OF MAGNESIA) suspension 30 mL  30 mL Oral Daily PRN Lenox Ponds, NP       melatonin tablet 2.5 mg  2.5 mg Oral QHS PRN Onuoha, Chinwendu V, NP   2.5 mg at 06/16/23 2251   OLANZapine (ZYPREXA) injection 5 mg  5 mg Intramuscular TID PRN Lenox Ponds, NP       OLANZapine zydis (ZYPREXA) disintegrating tablet 5 mg  5 mg Oral TID PRN Lenox Ponds, NP       traZODone (DESYREL) tablet 50 mg  50 mg Oral QHS PRN Onuoha, Chinwendu V, NP   50 mg at 06/16/23 2251   PTA Medications: Medications Prior to Admission  Medication Sig Dispense Refill Last Dose/Taking   acetaminophen (TYLENOL) 500 MG tablet Take 500 mg by mouth every 6 (six) hours as needed for mild pain (pain score 1-3) or headache.      amLODipine (NORVASC) 10 MG tablet Take 10 mg by mouth daily.      aspirin EC 81 MG tablet Take 81 mg by mouth daily. Swallow whole.      carvedilol (COREG) 6.25 MG tablet Take 6.25 mg by mouth 2 (two) times daily.      desvenlafaxine (PRISTIQ) 100 MG 24 hr tablet Take 100 mg by mouth daily.      testosterone (ANDROGEL) 50 MG/5GM (1%) GEL Place 5 g onto the skin daily. Apply topically to abdomen daily.      traZODone (DESYREL) 150 MG tablet Take 150 mg by mouth at bedtime.       Patient Stressors:  Health problems    Patient Strengths: Ability for insight  Capable of independent living  Communication skills  General fund of knowledge  Motivation for treatment/growth  Religious Affiliation  Supportive family/friends   Treatment Modalities: Medication Management, Group therapy, Case management,  1 to 1 session with clinician, Psychoeducation, Recreational therapy.   Physician Treatment Plan for Primary Diagnosis: MDD (major depressive disorder), recurrent severe, without psychosis (HCC) Long Term Goal(s):     Short Term Goals:    Medication Management: Evaluate patient's response, side effects, and tolerance of medication regimen.  Therapeutic Interventions: 1 to 1 sessions, Unit Group sessions and Medication administration.  Evaluation of Outcomes: Progressing  Physician Treatment Plan for Secondary Diagnosis: Principal Problem:   MDD (major depressive disorder), recurrent severe, without psychosis (HCC)  Long Term Goal(s):     Short Term Goals:       Medication Management: Evaluate patient's response, side effects, and tolerance of medication regimen.  Therapeutic Interventions: 1 to 1 sessions, Unit Group sessions and Medication administration.  Evaluation of Outcomes: Progressing   RN Treatment Plan for Primary Diagnosis: MDD (major depressive disorder), recurrent severe, without psychosis (HCC) Long Term Goal(s): Knowledge of disease and therapeutic regimen to maintain health will improve  Short  Term Goals: Ability to remain free from injury will improve, Ability to verbalize frustration and anger appropriately will improve, Ability to demonstrate self-control, Ability to participate in decision making will improve, Ability to verbalize feelings will improve, Ability to disclose and discuss suicidal ideas, Ability to identify and develop effective coping behaviors will improve, and Compliance with prescribed medications will improve  Medication Management: RN will  administer medications as ordered by provider, will assess and evaluate patient's response and provide education to patient for prescribed medication. RN will report any adverse and/or side effects to prescribing provider.  Therapeutic Interventions: 1 on 1 counseling sessions, Psychoeducation, Medication administration, Evaluate responses to treatment, Monitor vital signs and CBGs as ordered, Perform/monitor CIWA, COWS, AIMS and Fall Risk screenings as ordered, Perform wound care treatments as ordered.  Evaluation of Outcomes: Progressing   LCSW Treatment Plan for Primary Diagnosis: MDD (major depressive disorder), recurrent severe, without psychosis (HCC) Long Term Goal(s): Safe transition to appropriate next level of care at discharge, Engage patient in therapeutic group addressing interpersonal concerns.  Short Term Goals: Engage patient in aftercare planning with referrals and resources, Increase social support, Increase ability to appropriately verbalize feelings, Increase emotional regulation, Facilitate acceptance of mental health diagnosis and concerns, Facilitate patient progression through stages of change regarding substance use diagnoses and concerns, Identify triggers associated with mental health/substance abuse issues, and Increase skills for wellness and recovery  Therapeutic Interventions: Assess for all discharge needs, 1 to 1 time with Social worker, Explore available resources and support systems, Assess for adequacy in community support network, Educate family and significant other(s) on suicide prevention, Complete Psychosocial Assessment, Interpersonal group therapy.  Evaluation of Outcomes: Progressing   Progress in Treatment: Attending groups: Yes. and No. Participating in groups: Yes. and No. Taking medication as prescribed: Yes. Toleration medication: Yes. Family/Significant other contact made: No, will contact:  CSW will contact if given permission  Patient  understands diagnosis: Yes. Discussing patient identified problems/goals with staff: Yes. Medical problems stabilized or resolved: Yes. Denies suicidal/homicidal ideation: Yes. Issues/concerns per patient self-inventory: No. Other: None   New problem(s) identified: No, Describe:  None identified   New Short Term/Long Term Goal(s): elimination of symptoms of psychosis, medication management for mood stabilization; elimination of SI thoughts; development of comprehensive mental wellness plan.   Patient Goals:  " I would like to leave here with the proper medicine and a scheduled plan to go to my daughter's house. My goal also is to open up to talk to people, that is what I did not do"   Discharge Plan or Barriers: CSW will assist with appropriate discharge planning   Reason for Continuation of Hospitalization: Depression Medication stabilization Suicidal ideation  Estimated Length of Stay:  1 to 7 days   Last 3 [place] Suicide Severity Risk Score: Flowsheet Row Admission (Current) from 06/16/2023 in [facility]  C-SSRS RISK CATEGORY High Risk       Last PHQ 2/9 Scores:     No data to display          Scribe for Treatment Team: Elza Rafter, LCSWA 06/17/2023 3:10 PM

## 2023-06-17 NOTE — Plan of Care (Signed)
  Problem: Education: Goal: Knowledge of General Education information will improve Description: Including pain rating scale, medication(s)/side effects and non-pharmacologic comfort measures Outcome: Progressing   Problem: Health Behavior/Discharge Planning: Goal: Ability to manage health-related needs will improve Outcome: Progressing   Problem: Coping: Goal: Level of anxiety will decrease Outcome: Progressing   Problem: Education: Goal: Utilization of techniques to improve thought processes will improve Outcome: Progressing Goal: Knowledge of the prescribed therapeutic regimen will improve Outcome: Progressing

## 2023-06-17 NOTE — Progress Notes (Signed)
   06/17/23 1400  Psych Admission Type (Psych Patients Only)  Admission Status Voluntary  Psychosocial Assessment  Patient Complaints None  Eye Contact Fair  Facial Expression Other (Comment) (appropriate)  Affect Appropriate to circumstance  Speech Logical/coherent  Interaction Assertive  Motor Activity Slow  Appearance/Hygiene Unremarkable  Behavior Characteristics Cooperative  Mood Pleasant  Thought Process  Coherency WDL  Content WDL  Delusions None reported or observed  Perception WDL  Hallucination None reported or observed  Judgment WDL  Confusion WDL  Danger to Self  Current suicidal ideation? Denies  Danger to Others  Danger to Others None reported or observed

## 2023-06-17 NOTE — Group Note (Unsigned)
 Date:  06/17/2023 Time:  10:52 AM  Group Topic/Focus:  Movement Therapy     Participation Level:  {BHH PARTICIPATION ZOXWR:60454}  Participation Quality:  {BHH PARTICIPATION QUALITY:22265}  Affect:  {BHH AFFECT:22266}  Cognitive:  {BHH COGNITIVE:22267}  Insight: {BHH Insight2:20797}  Engagement in Group:  {BHH ENGAGEMENT IN UJWJX:91478}  Modes of Intervention:  {BHH MODES OF INTERVENTION:22269}  Additional Comments:  ***  Rodena Goldmann 06/17/2023, 10:52 AM

## 2023-06-17 NOTE — Plan of Care (Signed)
  Problem: Health Behavior/Discharge Planning: Goal: Ability to manage health-related needs will improve Outcome: Progressing   Problem: Coping: Goal: Level of anxiety will decrease Outcome: Progressing   
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
## 2023-06-17 NOTE — BHH Suicide Risk Assessment (Signed)
 [facility] Admission Suicide Risk Assessment   Nursing information obtained from:  Patient Demographic factors:  Male, Age 78 or older, Divorced or widowed, Caucasian, Living alone, Unemployed Current Mental Status:  NA Loss Factors:  Loss of significant relationship Historical Factors:  Prior suicide attempts Risk Reduction Factors:  Sense of responsibility to family, Religious beliefs about death, Positive social support, Positive therapeutic relationship, Positive coping skills or problem solving skills  Total Time spent with patient: 30 minutes Principal Problem: MDD (major depressive disorder), recurrent severe, without psychosis (HCC) Diagnosis:  Principal Problem:   MDD (major depressive disorder), recurrent severe, without psychosis (HCC)  Subjective Data: 78 year old male presents emergency department with attempted suicide. Patient presents as a level 1 trauma due to multiple lacerations on the upper extremities with tourniquet in place. Report from EMS is that patient was found sitting in a lawn chair with multiple lacerations, dark blood pooling. Tourniquets were placed and he was transported here emergentl   Continued Clinical Symptoms:  Alcohol Use Disorder Identification Test Final Score (AUDIT): 0 The "Alcohol Use Disorders Identification Test", Guidelines for Use in Primary Care, Second Edition.  World Science writer [facility]). Score between 0-7:  no or low risk or alcohol related problems. Score between 8-15:  moderate risk of alcohol related problems. Score between 16-19:  high risk of alcohol related problems. Score 20 or above:  warrants further diagnostic evaluation for alcohol dependence and treatment.   CLINICAL FACTORS:   Depression:   Hopelessness Previous Psychiatric Diagnoses and Treatments   Musculoskeletal: Strength & Muscle Tone: within normal limits Gait & Station: normal Patient leans: N/A  Psychiatric Specialty Exam:  Psychiatric Specialty  Exam:  Presentation  General Appearance: Well-groomed Eye Contact: Fair Speech: Normal Speech Volume: Normal Handedness: Right  Mood and Affect  Mood: Fine Affect: Euthymic  Thought Process  Thought Processes: Linear Descriptions of Associations: None reported Orientation: Oriented x 4 Thought Content: Linear Hallucinations: Denies Ideas of Reference: Denies Suicidal Thoughts: With intent and plan Homicidal Thoughts: Denies  Sensorium  Memory: Fair Judgment: Poor Insight: Impaired  Art therapist  Concentration: Fair Attention Span: Fair Recall: Impaired Fund of Knowledge: Good Language: Fair  Psychomotor Activity  Psychomotor Activity: Normal Musculoskeletal: Strength & Muscle Tone: Normal Gait & Station: Steady  Assets  Assets: Health and family  Sleep  Sleep: Fair  Physical Exam: Physical Exam ROS Blood pressure (!) 115/50, pulse 71, temperature 98.2 F (36.8 C), resp. rate 17, height 5\' 9"  (1.753 m), weight 98 kg, SpO2 100%. Body mass index is 31.9 kg/m.   COGNITIVE FEATURES THAT CONTRIBUTE TO RISK:  None    SUICIDE RISK:   Severe:  Frequent, intense, and enduring suicidal ideation, specific plan, no subjective intent, but some objective markers of intent (i.e., choice of lethal method), the method is accessible, some limited preparatory behavior, evidence of impaired self-control, severe dysphoria/symptomatology, multiple risk factors present, and few if any protective factors, particularly a lack of social support.  PLAN OF CARE: Patient is admitted to [facility] psych unit with Q15 min safety monitoring. Multidisciplinary team approach is offered. Medication management; group/milieu therapy is offered.   I certify that inpatient services furnished can reasonably be expected to improve the patient's condition.   Verner Chol, MD 06/17/2023, 12:29 PM

## 2023-06-17 NOTE — Progress Notes (Signed)
 Wound care not completed due to pt sleeping.

## 2023-06-17 NOTE — Progress Notes (Signed)
 Patient's daughter phoned and message left asking for the patient's med Pristiq to be brought in as this med is not in the hospital's formulary. A reminder to bring in the patient's CPAP machine was left as well. Patient does not want to use the CPAP from the hospital.

## 2023-06-17 NOTE — Group Note (Signed)
 Date:  06/17/2023 Time:  10:50 AM  Group Topic/Focus:  Movement Therapy    Participation Level:  Active  Participation Quality:  Appropriate  Affect:  Appropriate  Cognitive:  Appropriate  Insight: Appropriate  Engagement in Group:  Engaged  Modes of Intervention:  Activity and Discussion  Additional Comments:  none  Alex Underwood 06/17/2023, 10:50 AM

## 2023-06-18 LAB — CBC WITH DIFFERENTIAL/PLATELET
Abs Immature Granulocytes: 0.01 10*3/uL (ref 0.00–0.07)
Basophils Absolute: 0 10*3/uL (ref 0.0–0.1)
Basophils Relative: 1 %
Eosinophils Absolute: 0.1 10*3/uL (ref 0.0–0.5)
Eosinophils Relative: 2 %
HCT: 25 % — ABNORMAL LOW (ref 39.0–52.0)
Hemoglobin: 9.1 g/dL — ABNORMAL LOW (ref 13.0–17.0)
Immature Granulocytes: 0 %
Lymphocytes Relative: 12 %
Lymphs Abs: 0.7 10*3/uL (ref 0.7–4.0)
MCH: 31.7 pg (ref 26.0–34.0)
MCHC: 36.4 g/dL — ABNORMAL HIGH (ref 30.0–36.0)
MCV: 87.1 fL (ref 80.0–100.0)
Monocytes Absolute: 0.7 10*3/uL (ref 0.1–1.0)
Monocytes Relative: 13 %
Neutro Abs: 4.2 10*3/uL (ref 1.7–7.7)
Neutrophils Relative %: 72 %
Platelets: 197 10*3/uL (ref 150–400)
RBC: 2.87 MIL/uL — ABNORMAL LOW (ref 4.22–5.81)
RDW: 13 % (ref 11.5–15.5)
WBC: 5.7 10*3/uL (ref 4.0–10.5)
nRBC: 0 % (ref 0.0–0.2)

## 2023-06-18 LAB — BASIC METABOLIC PANEL WITH GFR
Anion gap: 10 (ref 5–15)
BUN: 17 mg/dL (ref 8–23)
CO2: 26 mmol/L (ref 22–32)
Calcium: 8.9 mg/dL (ref 8.9–10.3)
Chloride: 96 mmol/L — ABNORMAL LOW (ref 98–111)
Creatinine, Ser: 0.96 mg/dL (ref 0.61–1.24)
GFR, Estimated: >60 mL/min (ref >60)
Glucose, Bld: 112 mg/dL — ABNORMAL HIGH (ref 70–99)
Potassium: 3.5 mmol/L (ref 3.5–5.1)
Sodium: 132 mmol/L — ABNORMAL LOW (ref 135–145)

## 2023-06-18 MED ORDER — TESTOSTERONE 50 MG/5GM (1%) TD GEL: 5.0000 g | Freq: Every day | TRANSDERMAL | Status: DC

## 2023-06-18 MED ORDER — TESTOSTERONE 1.62 % TD GEL
1.0000 | Freq: Every day | TRANSDERMAL | Status: DC
  Administered 2023-06-18 – 2023-06-20 (×3): 1 via TRANSDERMAL
  Filled 2023-06-18 (×4): qty 75

## 2023-06-18 MED ORDER — TRAZODONE HCL 50 MG PO TABS
50.0000 mg | ORAL_TABLET | Freq: Every day | ORAL | Status: DC
  Administered 2023-06-18 – 2023-06-21 (×4): 50 mg via ORAL
  Filled 2023-06-18 (×4): qty 1

## 2023-06-18 MED ORDER — DESVENLAFAXINE SUCCINATE ER 50 MG PO TB24: 50.0000 mg | ORAL_TABLET | Freq: Every day | ORAL | Status: DC

## 2023-06-18 MED ORDER — AMLODIPINE BESYLATE 5 MG PO TABS
5.0000 mg | ORAL_TABLET | Freq: Every day | ORAL | Status: DC
  Administered 2023-06-19 – 2023-06-22 (×4): 5 mg via ORAL
  Filled 2023-06-18 (×4): qty 1

## 2023-06-18 MED ORDER — DESVENLAFAXINE SUCCINATE ER 50 MG PO TB24
50.0000 mg | ORAL_TABLET | Freq: Every day | ORAL | Status: DC
  Administered 2023-06-18 – 2023-06-22 (×5): 50 mg via ORAL
  Filled 2023-06-18 (×9): qty 1

## 2023-06-18 NOTE — BHH Suicide Risk Assessment (Signed)
 BHH INPATIENT:  Family/Significant Other Suicide Prevention Education  Suicide Prevention Education:  Education Completed;  Sales executive, daughter,  has been identified by the patient as the family member/significant other with whom the patient will be residing, and identified as the person(s) who will aid the patient in the event of a mental health crisis (suicidal ideations/suicide attempt).  With written consent from the patient, the family member/significant other has been provided the following suicide prevention education, prior to the and/or following the discharge of the patient.  The suicide prevention education provided includes the following: Suicide risk factors Suicide prevention and interventions National Suicide Hotline telephone number [facility] assessment telephone number [facility] Emergency Assistance 911 [facility] and/or Residential Mobile Crisis Unit telephone number  Request made of family/significant other to: Remove weapons (e.g., guns, rifles, knives), all items previously/currently identified as safety concern.   Remove drugs/medications (over-the-counter, prescriptions, illicit drugs), all items previously/currently identified as a safety concern.  The family member/significant other verbalizes understanding of the suicide prevention education information provided.  The family member/significant other agrees to remove the items of safety concern listed above.  The LCSWA contacted the patient daughter to provide SPI. The patient daughter stated that there are no guns in the home. She stated that at discharge he will come and stay with her after discharge. She stated that she did not have any concerns. She stated he had a girlfriend that was not a good influence but he is no longer with her.    Marshell Levan 06/18/2023, 4:00 PM

## 2023-06-18 NOTE — Plan of Care (Signed)
 Patient alert and oriented x4. Denies SI, HI, AVH and pain. Scheduled med refused "I was told not to take the carvedilol if my BP was low." Education provided on med use and side effects. PRN meds administered for sleep per MAR. Support and encouragement provided.  Routine safety checks conducted every 15 minutes.  Patient informed to notify staff with problems or concerns. No adverse drug reactions noted. Patient verbally contracts for safety at this time. Patient interacts well with others on the unit.  Patient remains safe at this time.  Problem: Education: Goal: Knowledge of General Education information will improve Description: Including pain rating scale, medication(s)/side effects and non-pharmacologic comfort measures Outcome: Progressing   Problem: Health Behavior/Discharge Planning: Goal: Ability to manage health-related needs will improve Outcome: Progressing   Problem: Safety: Goal: Ability to disclose and discuss suicidal ideas will improve Outcome: Progressing Goal: Ability to identify and utilize support systems that promote safety will improve Outcome: Progressing

## 2023-06-18 NOTE — Group Note (Signed)
 Date:  06/18/2023 Time:  4:42 AM  Group Topic/Focus:  Overcoming Stress:   The focus of this group is to define stress and help patients assess their triggers.    Participation Level:  Active  Participation Quality:  Appropriate  Affect:  Appropriate  Cognitive:  Appropriate  Insight: Good  Engagement in Group:  Engaged  Modes of Intervention:  Discussion  Additional Comments:    Burt Ek 06/18/2023, 4:42 AM

## 2023-06-18 NOTE — Group Note (Signed)
 Date:  06/18/2023 Time:  8:58 PM  Group Topic/Focus:  Healthy Communication:   The focus of this group is to discuss communication, barriers to communication, as well as healthy ways to communicate with others.    Participation Level:  Active  Participation Quality:  Appropriate  Affect:  Appropriate  Cognitive:  Appropriate  Insight: Appropriate  Engagement in Group:  Engaged  Modes of Intervention:  Discussion  Additional Comments:    Garry Heater 06/18/2023, 8:58 PM

## 2023-06-18 NOTE — Progress Notes (Signed)
   06/18/23 0600  15 Minute Checks  Location Bedroom  Visual Appearance Calm  Behavior Composed  Sleep (Behavioral Health Patients Only)  Calculate sleep? (Click Yes once per 24 hr at 0600 safety check) Yes  Documented sleep last 24 hours 5.75

## 2023-06-18 NOTE — Progress Notes (Signed)
 After eating breakfast, patient stated that he felt lightheaded and "not right". Vitals obtained at 0846. BP 98/84, P 77, O2 sat 100% RA. Patient given gatorade. Fluids consumed and patient then taken to his room via wheelchair to lie down. MD informed. Amlodipine and Cavedilol held.  Vitals reassessed  at 1015. BP(rt arm) 128/48, P 76, O2 sat 99% rm air. BP(lt arm) 119/53, P 76. "I feel better after that episode."

## 2023-06-18 NOTE — Progress Notes (Signed)
 [facility] MD Progress Note  06/18/2023 6:59 PM Flavio Lindroth  MRN:  [id number] Subjective:  78 year old male presents emergency department with attempted suicide. Patient presents as a level 1 trauma due to multiple lacerations on the upper extremities with tourniquet in place. Report from EMS is that patient was found sitting in a lawn chair with multiple lacerations, dark blood pooling. Tourniquets were placed and he was transported here emergently. Patient is admitted to [facility] unit with Q15 min safety monitoring. Multidisciplinary team approach is offered. Medication management; group/milieu therapy is offered.    Patient was seen today for assessment.On assessment he is alert and oriented times four, calm and cooperative.Patient reports that he was feeling lightheaded, staff gave him a Gatorade he states that he is feeling a little bit better.His blood pressure was 90 /84, He reports that this morning he was shaking and blacking out when he was sitting down.He is now lying down on interview. He denies hx of heart conditions. Patient reports that he came to the hospital after an intentional suicide attempt.He reports that leading up to this attempt he was having relationship problems with his girlfriend.He states that he had been living his girlfriend with no issues for the last two years, but over the last six months she did not want to spend money on her hearing aids and she had begin playing TV, music, and phone videos out loud all the time. Patient reports that he also got COVID a few weeks ago and with all of the noise and being exhausted, he got to a breaking point and could not take it anymore. He moved his girlfriend out of his house  and then became extremely depressed.He reports that he sat in his bathtub with a knife contemplating suicide.He then went to his daughter's house for six days and had a great time hanging out with family and playing golf, but reports that he went back to his home to clean  out his c-pap machine and when he was alone he become very depressed and felt compelled to kill himself. He reports this was an impulsive decision And was not planned out. He reports that he initially sat in his car and turned the engine on with the garage door closed hoping to kill himself by carbon monoxide poisoning. He reports that that took too long and he got scared, so then he got a knife and sat in the garage and cut both of his wrist, he reports that he was upset that blood was not rushing out like he thought it would, and then he cut his arm.He reports that he began getting very weak and cold, which made him scared. He then opened the garage door and crawled out to the driveway and started screaming for help.He reports that at this point he was very weak it was hard for him to yell, but states that his neighbor found him and called 911. Patient denies any manic symptoms, he continues to appear depressed and when asked how he felt that he was alive, he would not directly answer the questions. Patient reports that he was on pristique  50 mg and was recently increased to 100 mg and him and his family both thought that was too strong.He reports that he was on Pristique 50mg  for 12 years along with Trazodone and felt that worked fine for him.He believes that all of his issues are just coming from COVID and these life changes with his girlfriend.He also reports that he is supposed to  be on a daily testosterone injections, along with. norvasc 5 mg. Patient denies SI/HI/AVH    Principal Problem: MDD (major depressive disorder), recurrent severe, without psychosis (HCC) Diagnosis: Principal Problem:   MDD (major depressive disorder), recurrent severe, without psychosis (HCC)  Total Time spent with patient: 20 minutes  Past Psychiatric History: Psychiatric History:  Information collected from patient   Prev Dx/Sx: Depression and anxiety Current Psych Provider: Unable to recall but looks like PCP is  managing the medications Home Meds (current): Pristiq 50 mg Previous Med Trials: Unable to recall Therapy: None reported   Prior Psych Hospitalization: Few years ago unable to recall the details Prior Self Harm: None reported, last suicide attempt was 2 weeks ago before this plan as documented in H&P Prior Violence: None reported   Family Psych History: Multiple family members with depression and anxiety and alcohol use Family Hx suicide: His brother died by suicide    Past Medical History: History reviewed. No pertinent past medical history. History reviewed. No pertinent surgical history. Family History: History reviewed. No pertinent family history. Family Psychiatric  History: Multiple family members with depression and anxiety and alcohol use Family Hx suicide: His brother died by suicide Social History:  Social History   Substance and Sexual Activity  Alcohol Use None     Social History   Substance and Sexual Activity  Drug Use Not on file    Social History   Socioeconomic History   Marital status: Unknown    Spouse name: Not on file   Number of children: Not on file   Years of education: Not on file   Highest education level: Not on file  Occupational History   Not on file  Tobacco Use   Smoking status: Never   Smokeless tobacco: Never  Substance and Sexual Activity   Alcohol use: Not on file   Drug use: Not on file   Sexual activity: Not on file  Other Topics Concern   Not on file  Social History Narrative   Not on file   Social Drivers of Health   Financial Resource Strain: Not on file  Food Insecurity: No Food Insecurity (06/16/2023)   Hunger Vital Sign    Worried About Running Out of Food in the Last Year: Never true    Ran Out of Food in the Last Year: Never true  Transportation Needs: No Transportation Needs (06/16/2023)   PRAPARE - Administrator, Civil Service (Medical): No    Lack of Transportation (Non-Medical): No  Physical  Activity: Not on file  Stress: Not on file  Social Connections: Moderately Integrated (06/16/2023)   Social Connection and Isolation Panel [NHANES]    Frequency of Communication with Friends and Family: Three times a week    Frequency of Social Gatherings with Friends and Family: Once a week    Attends Religious Services: More than 4 times per year    Active Member of [employer] or Organizations: Yes    Attends Banker Meetings: More than 4 times per year    Marital Status: Widowed   Additional Social History:                         Sleep: Poor  Appetite:  Good  Current Medications: Current Facility-Administered Medications  Medication Dose Route Frequency Provider Last Rate Last Admin   acetaminophen (TYLENOL) tablet 650 mg  650 mg Oral Q6H PRN Lenox Ponds, NP  alum & mag hydroxide-simeth (MAALOX/MYLANTA) 200-200-20 MG/5ML suspension 30 mL  30 mL Oral Q4H PRN Lenox Ponds, NP       Melene Muller ON 06/19/2023] amLODipine (NORVASC) tablet 5 mg  5 mg Oral Daily Sanjuana Mae, MD       desvenlafaxine (PRISTIQ) 24 hr tablet 50 mg  50 mg Oral Daily Sanjuana Mae, MD   50 mg at 06/18/23 1348   magnesium hydroxide (MILK OF MAGNESIA) suspension 30 mL  30 mL Oral Daily PRN Lenox Ponds, NP       melatonin tablet 2.5 mg  2.5 mg Oral QHS PRN Onuoha, Chinwendu V, NP   2.5 mg at 06/17/23 2141   OLANZapine (ZYPREXA) injection 5 mg  5 mg Intramuscular TID PRN Lenox Ponds, NP       OLANZapine zydis (ZYPREXA) disintegrating tablet 5 mg  5 mg Oral TID PRN Lenox Ponds, NP       testosterone (ANDROGEL) 50 MG/5GM (1%) gel 5 g  5 g Transdermal Daily Sanjuana Mae, MD       traZODone (DESYREL) tablet 50 mg  50 mg Oral QHS Sanjuana Mae, MD        Lab Results:  Results for orders placed or performed during the hospital encounter of 06/16/23 (from the past 48 hours)  CBC with Differential/Platelet     Status: Abnormal   Collection Time:  06/18/23 11:04 AM  Result Value Ref Range   WBC 5.7 4.0 - 10.5 K/uL   RBC 2.87 (L) 4.22 - 5.81 MIL/uL   Hemoglobin 9.1 (L) 13.0 - 17.0 g/dL   HCT 65.7 (L) 84.6 - 96.2 %   MCV 87.1 80.0 - 100.0 fL   MCH 31.7 26.0 - 34.0 pg   MCHC 36.4 (H) 30.0 - 36.0 g/dL   RDW 95.2 84.1 - 32.4 %   Platelets 197 150 - 400 K/uL   nRBC 0.0 0.0 - 0.2 %   Neutrophils Relative % 72 %   Neutro Abs 4.2 1.7 - 7.7 K/uL   Lymphocytes Relative 12 %   Lymphs Abs 0.7 0.7 - 4.0 K/uL   Monocytes Relative 13 %   Monocytes Absolute 0.7 0.1 - 1.0 K/uL   Eosinophils Relative 2 %   Eosinophils Absolute 0.1 0.0 - 0.5 K/uL   Basophils Relative 1 %   Basophils Absolute 0.0 0.0 - 0.1 K/uL   Immature Granulocytes 0 %   Abs Immature Granulocytes 0.01 0.00 - 0.07 K/uL    Comment: Performed at [facility], [address] [place], [place] [address]  Basic metabolic panel     Status: Abnormal   Collection Time: 06/18/23 11:04 AM  Result Value Ref Range   Sodium 132 (L) 135 - 145 mmol/L   Potassium 3.5 3.5 - 5.1 mmol/L   Chloride 96 (L) 98 - 111 mmol/L   CO2 26 22 - 32 mmol/L   Glucose, Bld 112 (H) 70 - 99 mg/dL    Comment: Glucose reference range applies only to samples taken after fasting for at least 8 hours.   BUN 17 8 - 23 mg/dL   Creatinine, Ser 7.25 0.61 - 1.24 mg/dL   Calcium 8.9 8.9 - 36.6 mg/dL   GFR, Estimated >44 >03 mL/min    Comment: (NOTE) Calculated using the CKD-EPI Creatinine Equation (2021)    Anion gap 10 5 - 15    Comment: Performed at [facility], [address], [place], [place] [address]    Blood Alcohol level:  Lab Results  Component Value Date   ETH <10 06/15/2023    Metabolic Disorder Labs: No results found for: "HGBA1C", "MPG" No results found for: "PROLACTIN" No results found for: "CHOL", "TRIG", "HDL", "CHOLHDL", "VLDL", "LDLCALC"  Physical Findings: AIMS:  , ,  ,  ,    CIWA:    COWS:     Musculoskeletal: Strength & Muscle Tone: within normal  limits Gait & Station: normal Patient leans: N/A  Psychiatric Specialty Exam:  Presentation  General Appearance:  Casual; Appropriate for Environment  Eye Contact: Fair  Speech: Clear and Coherent  Speech Volume: Normal  Handedness: Right   Mood and Affect  Mood: Depressed  Affect: Depressed   Thought Process  Thought Processes: Coherent  Descriptions of Associations:Intact  Orientation:Full (Time, Place and Person)  Thought Content:Logical  History of Schizophrenia/Schizoaffective disorder:No  Duration of Psychotic Symptoms:No data recorded Hallucinations:Hallucinations: None  Ideas of Reference:None  Suicidal Thoughts:Suicidal Thoughts: No SI Active Intent and/or Plan: Without Intent; Without Plan  Homicidal Thoughts:Homicidal Thoughts: No   Sensorium  Memory: Immediate Fair; Recent Fair  Judgment: Impaired  Insight: Fair   Chartered certified accountant: Fair  Attention Span: Fair  Recall: [employer] of Knowledge: Fair  Language: Fair   Psychomotor Activity  Psychomotor Activity: Psychomotor Activity: Normal   Assets  Assets: Manufacturing systems engineer; Desire for Improvement; Physical Health   Sleep  Sleep: Sleep: Poor    Physical Exam: Physical Exam HENT:     Nose: Nose normal.     Mouth/Throat:     Mouth: Mucous membranes are moist.  Eyes:     Extraocular Movements: Extraocular movements intact.     Pupils: Pupils are equal, round, and reactive to light.  Cardiovascular:     Comments: Bp 119/53 Neurological:     Mental Status: He is alert.     Motor: Weakness present.  Psychiatric:        Attention and Perception: Attention and perception normal.        Mood and Affect: Mood is depressed.        Speech: Speech normal.        Behavior: Behavior normal.        Thought Content: Thought content normal.        Cognition and Memory: Cognition and memory normal.        Judgment: Judgment normal.     Review of Systems  Eyes: Negative.   Respiratory: Negative.    Cardiovascular:         Blood pressure  90/84  Gastrointestinal: Negative.   Genitourinary: Negative.   Musculoskeletal: Negative.   Neurological:  Positive for dizziness and weakness.  Endo/Heme/Allergies: Negative.   Psychiatric/Behavioral:  Positive for depression.    Blood pressure (!) 113/51, pulse 63, temperature (!) 97.5 F (36.4 C), temperature source Oral, resp. rate 16, height 5\' 9"  (1.753 m), weight 98 kg, SpO2 100%. Body mass index is 31.9 kg/m.   Treatment Plan Summary: The patient presented after a recent suicide attempt.  He reports worsening depression following a COVID infection in ongoing relationship stress with his girlfriend and feeling of isolation. After a visit with his daughter, he returned home and became deeply depressed and had multiple suicide attempts, first by trying carbon monoxide poisoning and then by cutting his wrist.He denies any manic symptoms, attributes most of this current depression on his recent life changes.Today he reports that he has been feeling light headed and he weak and felt as if he was blacking  out.Staff gave patient Gatorade and he is lying in bed. He continues to appear depressed, though he denies SI/HI/AVH.  Blood pressure : 90/84 order EKG order full workup cbc bmp  orthstats Encourage fluids   discontinue carvidolol  Decrease amlodipine 5 mg  Initiate trazodone 50 hs for sleep per patient  Initiate pristique 50mg  daily - family brougth in today  Initate Testosterone 5g transdermal  Principal Diagnosis: MDD (major depressive disorder), recurrent severe, without psychosis (HCC) Diagnosis:  Principal Problem:   MDD (major depressive disorder), recurrent severe, without psychosis (HCC)    Safety and Monitoring: Patient is on IVC             -- Involuntary admission to inpatient psychiatric unit for safety, stabilization and treatment             -- Daily  contact with patient to assess and evaluate symptoms and progress in treatment             -- Patient's case to be discussed in multi-disciplinary team meeting             -- Observation Level: q15 minute checks             -- Vital signs:  q12 hours             -- Precautions: suicide, elopement, and assault  2. Psychiatric Diagnoses and Treatment:  Initiate Pristique 50mg  daily P O for depression and  Trazodone 50 mg Po for sleep disturbances               -- Encouraged patient to participate in unit milieu and in scheduled group therapies     3. Medical Issues Being Addressed:     DC Coreg.  Reduce amlodipine to 5 mg patient has low blood pressure  Episode today. Resume testosterone.   Ordered orthostatic vitals, EKG, CBC, BMP,  4. Discharge Planning:              -- Social work and case management to assist with discharge planning and identification of hospital follow-up needs prior to discharge             -- Estimated LOS: 5-7 days             -- Discharge Concerns: Need to establish a safety plan; Medication compliance and effectiveness             -- Discharge Goals: Return home with outpatient referrals follow ups   Physician Treatment Plan for Primary Diagnosis: MDD (major depressive disorder), recurrent severe, without psychosis (HCC) Long Term Goal(s): Improvement in symptoms so as ready for discharge   Short Term Goals: Ability to identify changes in lifestyle to reduce recurrence of condition will improve, Ability to verbalize feelings will improve, Ability to disclose and discuss suicidal ideas, Ability to demonstrate self-control will improve, and Ability to identify and develop effective coping behaviors will improve   Physician Treatment Plan for Secondary Diagnosis: Principal Problem:   MDD (major depressive disorder), recurrent severe, without psychosis (HCC)   Long Term Goal(s): Improvement in symptoms so as ready for discharge   Short Term Goals: Ability to  identify changes in lifestyle to reduce recurrence of condition will improve, Ability to verbalize feelings will improve, Ability to disclose and discuss suicidal ideas, Ability to demonstrate self-control will improve, Ability to identify and develop effective coping behaviors will improve, Ability to maintain clinical measurements within normal limits will improve, Compliance with prescribed medications will improve, and Ability to  identify triggers associated with substance abuse/mental health issues will improve   I certify that inpatient services furnished can reasonably be expected to improve the patient's condition.          Sanjuana Mae, MD 06/18/2023, 6:59 PM

## 2023-06-18 NOTE — Plan of Care (Signed)
   Problem: Education: Goal: Knowledge of General Education information will improve Description Including pain rating scale, medication(s)/side effects and non-pharmacologic comfort measures Outcome: Progressing   Problem: Health Behavior/Discharge Planning: Goal: Ability to manage health-related needs will improve Outcome: Progressing

## 2023-06-18 NOTE — Progress Notes (Signed)
   06/17/23 2135  Psych Admission Type (Psych Patients Only)  Admission Status Voluntary  Psychosocial Assessment  Patient Complaints None  Eye Contact Fair  Facial Expression Other (Comment) (WNL)  Affect Appropriate to circumstance  Speech Logical/coherent  Interaction Assertive  Motor Activity Slow  Appearance/Hygiene Unremarkable  Behavior Characteristics Cooperative  Mood Pleasant  Aggressive Behavior  Effect No apparent injury  Thought Process  Coherency WDL  Content WDL  Delusions None reported or observed  Perception WDL  Hallucination None reported or observed  Judgment WDL  Confusion WDL  Danger to Self  Current suicidal ideation? Denies  Agreement Not to Harm Self Yes  Description of Agreement Verbal  Danger to Others  Danger to Others None reported or observed

## 2023-06-18 NOTE — Group Note (Deleted)
 Date:  06/18/2023 Time:  9:53 PM  Group Topic/Focus:  Wrap-Up Group:   The focus of this group is to help patients review their daily goal of treatment and discuss progress on daily workbooks.     Participation Level:  {BHH PARTICIPATION WUJWJ:19147}  Participation Quality:  {BHH PARTICIPATION QUALITY:22265}  Affect:  {BHH AFFECT:22266}  Cognitive:  {BHH COGNITIVE:22267}  Insight: {BHH Insight2:20797}  Engagement in Group:  {BHH ENGAGEMENT IN WGNFA:21308}  Modes of Intervention:  {BHH MODES OF INTERVENTION:22269}  Additional Comments:  ***  Maglione,Angell Honse E 06/18/2023, 9:53 PM

## 2023-06-19 NOTE — Progress Notes (Signed)
   06/19/23 1400  Psych Admission Type (Psych Patients Only)  Admission Status Voluntary  Psychosocial Assessment  Patient Complaints None  Eye Contact Fair  Facial Expression Other (Comment) (appropriate)  Affect Appropriate to circumstance  Speech Logical/coherent  Interaction Assertive  Motor Activity Slow  Appearance/Hygiene Unremarkable  Behavior Characteristics Cooperative  Mood Pleasant  Thought Process  Coherency WDL  Content WDL  Delusions None reported or observed  Perception WDL  Hallucination None reported or observed  Judgment WDL  Confusion WDL  Danger to Self  Current suicidal ideation? Denies  Agreement Not to Harm Self Yes  Description of Agreement verbal  Danger to Others  Danger to Others None reported or observed

## 2023-06-19 NOTE — Plan of Care (Signed)
  Problem: Education: Goal: Knowledge of General Education information will improve Description: Including pain rating scale, medication(s)/side effects and non-pharmacologic comfort measures Outcome: Progressing   Problem: Coping: Goal: Level of anxiety will decrease Outcome: Progressing   Problem: Safety: Goal: Ability to disclose and discuss suicidal ideas will improve Outcome: Progressing Goal: Ability to identify and utilize support systems that promote safety will improve Outcome: Progressing

## 2023-06-19 NOTE — Progress Notes (Signed)
 [facility] MD Progress Note  06/19/2023 7:55 PM Daveion Robar  MRN:  [id number] Subjective:  78 year old male presents emergency department with attempted suicide. Patient presents as a level 1 trauma due to multiple lacerations on the upper extremities with tourniquet in place. Report from EMS is that patient was found sitting in a lawn chair with multiple lacerations, dark blood pooling. Tourniquets were placed and he was transported here emergently. Patient is admitted to [facility] unit with Q15 min safety monitoring. Multidisciplinary team approach is offered. Medication management; group/milieu therapy is offered.    Patient was seen today for assessment.On assessment he is alert and oriented times four, calm and cooperative. Today he has a much brighter affect and is seen laughing ans smiling. He denies that he is still feeling dizzy. EKG QTC 467. Patient reports he believes he was withdrawing from Pristique and that's what was causing these symptoms. He reports he feels "much better" today. He reports his daughter came to visit yesterday and is now working with social work team to set up after care. Today he denies SI/HI/AVH.  Staff reports he is pleasant, and medication compliant.  Principal Problem: MDD (major depressive disorder), recurrent severe, without psychosis (HCC) Diagnosis: Principal Problem:   MDD (major depressive disorder), recurrent severe, without psychosis (HCC)  Total Time spent with patient: 20 minutes  Past Psychiatric History: Psychiatric History:  Information collected from patient   Prev Dx/Sx: Depression and anxiety Current Psych Provider: Unable to recall but looks like PCP is managing the medications Home Meds (current): Pristiq 50 mg Previous Med Trials: Unable to recall Therapy: None reported   Prior Psych Hospitalization: Few years ago unable to recall the details Prior Self Harm: None reported, last suicide attempt was 2 weeks ago before this plan as documented  in H&P Prior Violence: None reported   Family Psych History: Multiple family members with depression and anxiety and alcohol use Family Hx suicide: His brother died by suicide    Past Medical History: History reviewed. No pertinent past medical history. History reviewed. No pertinent surgical history. Family History: History reviewed. No pertinent family history. Family Psychiatric  History: Multiple family members with depression and anxiety and alcohol use Family Hx suicide: His brother died by suicide Social History:  Social History   Substance and Sexual Activity  Alcohol Use None     Social History   Substance and Sexual Activity  Drug Use Not on file    Social History   Socioeconomic History   Marital status: Unknown    Spouse name: Not on file   Number of children: Not on file   Years of education: Not on file   Highest education level: Not on file  Occupational History   Not on file  Tobacco Use   Smoking status: Never   Smokeless tobacco: Never  Substance and Sexual Activity   Alcohol use: Not on file   Drug use: Not on file   Sexual activity: Not on file  Other Topics Concern   Not on file  Social History Narrative   Not on file   Social Drivers of Health   Financial Resource Strain: Not on file  Food Insecurity: No Food Insecurity (06/16/2023)   Hunger Vital Sign    Worried About Running Out of Food in the Last Year: Never true    Ran Out of Food in the Last Year: Never true  Transportation Needs: No Transportation Needs (06/16/2023)   PRAPARE - Transportation    Lack of  Transportation (Medical): No    Lack of Transportation (Non-Medical): No  Physical Activity: Not on file  Stress: Not on file  Social Connections: Moderately Integrated (06/16/2023)   Social Connection and Isolation Panel [NHANES]    Frequency of Communication with Friends and Family: Three times a week    Frequency of Social Gatherings with Friends and Family: Once a week    Attends  Religious Services: More than 4 times per year    Active Member of [employer] or Organizations: Yes    Attends Banker Meetings: More than 4 times per year    Marital Status: Widowed   Additional Social History:                         Sleep: Poor  Appetite:  Good  Current Medications: Current Facility-Administered Medications  Medication Dose Route Frequency Provider Last Rate Last Admin   acetaminophen (TYLENOL) tablet 650 mg  650 mg Oral Q6H PRN Lenox Ponds, NP       alum & mag hydroxide-simeth (MAALOX/MYLANTA) 200-200-20 MG/5ML suspension 30 mL  30 mL Oral Q4H PRN Lenox Ponds, NP       amLODipine (NORVASC) tablet 5 mg  5 mg Oral Daily Sanjuana Mae, MD   5 mg at 06/19/23 1034   desvenlafaxine (PRISTIQ) 24 hr tablet 50 mg  50 mg Oral Daily Sanjuana Mae, MD   50 mg at 06/19/23 1034   magnesium hydroxide (MILK OF MAGNESIA) suspension 30 mL  30 mL Oral Daily PRN Lenox Ponds, NP       melatonin tablet 2.5 mg  2.5 mg Oral QHS PRN Onuoha, Chinwendu V, NP   2.5 mg at 06/18/23 2121   OLANZapine (ZYPREXA) injection 5 mg  5 mg Intramuscular TID PRN Lenox Ponds, NP       OLANZapine zydis (ZYPREXA) disintegrating tablet 5 mg  5 mg Oral TID PRN Lenox Ponds, NP       Testosterone 1.62 % GEL 1 packet  1 packet Transdermal Daily Sanjuana Mae, MD   1 packet at 06/19/23 1033   traZODone (DESYREL) tablet 50 mg  50 mg Oral QHS Sanjuana Mae, MD   50 mg at 06/18/23 2121    Lab Results:  Results for orders placed or performed during the hospital encounter of 06/16/23 (from the past 48 hours)  CBC with Differential/Platelet     Status: Abnormal   Collection Time: 06/18/23 11:04 AM  Result Value Ref Range   WBC 5.7 4.0 - 10.5 K/uL   RBC 2.87 (L) 4.22 - 5.81 MIL/uL   Hemoglobin 9.1 (L) 13.0 - 17.0 g/dL   HCT 44.0 (L) 34.7 - 42.5 %   MCV 87.1 80.0 - 100.0 fL   MCH 31.7 26.0 - 34.0 pg   MCHC 36.4 (H) 30.0 - 36.0 g/dL   RDW  95.6 38.7 - 56.4 %   Platelets 197 150 - 400 K/uL   nRBC 0.0 0.0 - 0.2 %   Neutrophils Relative % 72 %   Neutro Abs 4.2 1.7 - 7.7 K/uL   Lymphocytes Relative 12 %   Lymphs Abs 0.7 0.7 - 4.0 K/uL   Monocytes Relative 13 %   Monocytes Absolute 0.7 0.1 - 1.0 K/uL   Eosinophils Relative 2 %   Eosinophils Absolute 0.1 0.0 - 0.5 K/uL   Basophils Relative 1 %   Basophils Absolute 0.0 0.0 - 0.1 K/uL   Immature Granulocytes 0 %  Abs Immature Granulocytes 0.01 0.00 - 0.07 K/uL    Comment: Performed at [facility], [address] [place], [place] [address]  Basic metabolic panel     Status: Abnormal   Collection Time: 06/18/23 11:04 AM  Result Value Ref Range   Sodium 132 (L) 135 - 145 mmol/L   Potassium 3.5 3.5 - 5.1 mmol/L   Chloride 96 (L) 98 - 111 mmol/L   CO2 26 22 - 32 mmol/L   Glucose, Bld 112 (H) 70 - 99 mg/dL    Comment: Glucose reference range applies only to samples taken after fasting for at least 8 hours.   BUN 17 8 - 23 mg/dL   Creatinine, Ser 6.04 0.61 - 1.24 mg/dL   Calcium 8.9 8.9 - 54.0 mg/dL   GFR, Estimated >98 >11 mL/min    Comment: (NOTE) Calculated using the CKD-EPI Creatinine Equation (2021)    Anion gap 10 5 - 15    Comment: Performed at [facility], [address] [place], [place] [address]    Blood Alcohol level:  Lab Results  Component Value Date   [facility] <10 06/15/2023    Metabolic Disorder Labs: No results found for: "HGBA1C", "MPG" No results found for: "PROLACTIN" No results found for: "CHOL", "TRIG", "HDL", "CHOLHDL", "VLDL", "LDLCALC"  Physical Findings: AIMS:  , ,  ,  ,    CIWA:    COWS:     Musculoskeletal: Strength & Muscle Tone: within normal limits Gait & Station: normal Patient leans: N/A  Psychiatric Specialty Exam:  Presentation  General Appearance:  Appropriate for Environment  Eye Contact: Good  Speech: Clear and Coherent  Speech Volume: Normal  Handedness: Right   Mood and Affect   Mood: Euthymic  Affect: Congruent   Thought Process  Thought Processes: Coherent  Descriptions of Associations:Intact  Orientation:Full (Time, Place and Person)  Thought Content:Logical  History of Schizophrenia/Schizoaffective disorder:No  Duration of Psychotic Symptoms:No data recorded Hallucinations:Hallucinations: None  Ideas of Reference:None  Suicidal Thoughts:Suicidal Thoughts: No  Homicidal Thoughts:Homicidal Thoughts: No   Sensorium  Memory: Immediate Good; Recent Good; Remote Good  Judgment: Fair  Insight: Fair   Art therapist  Concentration: Fair  Attention Span: Fair  Recall: Fair  Fund of Knowledge: Fair  Language: Good   Psychomotor Activity  Psychomotor Activity: Psychomotor Activity: Normal    Assets  Assets: Communication Skills; Financial Resources/Insurance; Physical Health; Social Support   Sleep  Sleep: Sleep: Fair    Physical Exam: Physical Exam HENT:     Nose: Nose normal.     Mouth/Throat:     Mouth: Mucous membranes are moist.  Eyes:     Extraocular Movements: Extraocular movements intact.     Pupils: Pupils are equal, round, and reactive to light.  Neurological:     General: No focal deficit present.     Mental Status: He is alert.     Motor: No weakness.  Psychiatric:        Attention and Perception: Attention and perception normal.        Mood and Affect: Mood and affect normal. Mood is not depressed.        Speech: Speech normal.        Behavior: Behavior normal.        Thought Content: Thought content normal.        Cognition and Memory: Cognition and memory normal.        Judgment: Judgment normal.    Review of Systems  Eyes: Negative.  Respiratory: Negative.    Cardiovascular:         Blood pressure  90/84  Gastrointestinal: Negative.   Genitourinary: Negative.   Musculoskeletal: Negative.   Neurological: Negative.  Negative for dizziness and weakness.   Endo/Heme/Allergies: Negative.   Psychiatric/Behavioral: Negative.  Negative for depression.    Blood pressure 131/68, pulse 78, temperature 98.1 F (36.7 C), resp. rate 14, height 5\' 9"  (1.753 m), weight 98 kg, SpO2 99%. Body mass index is 31.9 kg/m.   Treatment Plan Summary 06/19/2023: Today patient is brighter in affect. He does not appear significantly depressed. He denies SI/HI/AVH. He is now denying dizziness, educated him again on medication changes. reports no other side effects of medications and has no further concerns.  HR today 78  EKG QTC 467 Orth stats were positive, Encourage fluids, and continue to monitor closely, will give fluids if needed    Principal Diagnosis: MDD (major depressive disorder), recurrent severe, without psychosis (HCC) Diagnosis:  Principal Problem:   MDD (major depressive disorder), recurrent severe, without psychosis (HCC)    Safety and Monitoring: Patient is on IVC             -- Involuntary admission to inpatient psychiatric unit for safety, stabilization and treatment             -- Daily contact with patient to assess and evaluate symptoms and progress in treatment             -- Patient's case to be discussed in multi-disciplinary team meeting             -- Observation Level: q15 minute checks             -- Vital signs:  q12 hours             -- Precautions: suicide, elopement, and assault  2. Psychiatric Diagnoses and Treatment:  Initiate Pristique 50mg  daily P O for depression and  Trazodone 50 mg Po for sleep disturbances               -- Encouraged patient to participate in unit milieu and in scheduled group therapies     3. Medical Issues Being Addressed:     DC Coreg.  Reduce amlodipine to 5 mg patient has low blood pressure  Episode today. Resume testosterone.   Ordered orthostatic vitals, EKG, CBC, BMP,  4. Discharge Planning:              -- Social work and case management to assist with discharge planning and  identification of hospital follow-up needs prior to discharge             -- Estimated LOS: 5-7 days             -- Discharge Concerns: Need to establish a safety plan; Medication compliance and effectiveness             -- Discharge Goals: Return home with outpatient referrals follow ups   Physician Treatment Plan for Primary Diagnosis: MDD (major depressive disorder), recurrent severe, without psychosis (HCC) Long Term Goal(s): Improvement in symptoms so as ready for discharge   Short Term Goals: Ability to identify changes in lifestyle to reduce recurrence of condition will improve, Ability to verbalize feelings will improve, Ability to disclose and discuss suicidal ideas, Ability to demonstrate self-control will improve, and Ability to identify and develop effective coping behaviors will improve   Physician Treatment Plan for Secondary Diagnosis: Principal Problem:   MDD (major depressive disorder),  recurrent severe, without psychosis (HCC)   Long Term Goal(s): Improvement in symptoms so as ready for discharge   Short Term Goals: Ability to identify changes in lifestyle to reduce recurrence of condition will improve, Ability to verbalize feelings will improve, Ability to disclose and discuss suicidal ideas, Ability to demonstrate self-control will improve, Ability to identify and develop effective coping behaviors will improve, Ability to maintain clinical measurements within normal limits will improve, Compliance with prescribed medications will improve, and Ability to identify triggers associated with substance abuse/mental health issues will improve   I certify that inpatient services furnished can reasonably be expected to improve the patient's condition.          Sanjuana Mae, MD 06/19/2023, 7:55 PM

## 2023-06-19 NOTE — Plan of Care (Signed)
  Problem: Health Behavior/Discharge Planning: Goal: Ability to manage health-related needs will improve 06/19/2023 1443 by Luane School, RN Outcome: Progressing 06/19/2023 1442 by Luane School, RN Outcome: Progressing   Problem: Coping: Goal: Level of anxiety will decrease 06/19/2023 1443 by Luane School, RN Outcome: Progressing 06/19/2023 1442 by Luane School, RN Outcome: Progressing

## 2023-06-19 NOTE — Progress Notes (Addendum)
 Patient resting quietly in bed with eyes closed. Respirations equal and unlabored, skin warm and dry, and CPAP is in place. No change in assessment or acuity. Q 15 safety min checks conducted/ongoing. Will continue to monitor for safety.    06/18/23 2121  Psych Admission Type (Psych Patients Only)  Admission Status Voluntary  Psychosocial Assessment  Patient Complaints None  Eye Contact Fair  Facial Expression Other (Comment) (WNL)  Affect Appropriate to circumstance  Speech Logical/coherent  Interaction Assertive  Motor Activity Slow  Appearance/Hygiene Unremarkable  Behavior Characteristics Cooperative  Mood Pleasant  Aggressive Behavior  Effect No apparent injury  Thought Process  Coherency WDL  Content WDL  Delusions None reported or observed  Perception WDL  Hallucination None reported or observed  Judgment WDL  Confusion WDL  Danger to Self  Current suicidal ideation? Denies  Agreement Not to Harm Self Yes  Description of Agreement Verbal  Danger to Others  Danger to Others None reported or observed

## 2023-06-19 NOTE — Plan of Care (Signed)
  Problem: Health Behavior/Discharge Planning: Goal: Ability to manage health-related needs will improve Outcome: Progressing   Problem: Coping: Goal: Level of anxiety will decrease Outcome: Progressing   

## 2023-06-19 NOTE — Group Note (Signed)
 Date:  06/19/2023 Time:  7:00 PM  Group Topic/Focus:  Coping With Mental Health Crisis:   The purpose of this group is to help patients identify strategies for coping with mental health crisis.  Group discusses possible causes of crisis and ways to manage them effectively. Healthy Communication:   The focus of this group is to discuss communication, barriers to communication, as well as healthy ways to communicate with others. Making Healthy Choices:   The focus of this group is to help patients identify negative/unhealthy choices they were using prior to admission and identify positive/healthier coping strategies to replace them upon discharge.    Participation Level:  Did Not Attend  Participation Quality:    Affect:    Cognitive:    Insight:   Engagement in Group:    Modes of Intervention:    Additional Comments:  Did Not Attend   Eulia Hatcher l Kaylena Pacifico 06/19/2023, 7:00 PM

## 2023-06-19 NOTE — Progress Notes (Signed)
   06/19/23 0600  15 Minute Checks  Location Bedroom  Visual Appearance Calm  Behavior Sleeping  Sleep (Behavioral Health Patients Only)  Calculate sleep? (Click Yes once per 24 hr at 0600 safety check) Yes  Documented sleep last 24 hours 9.5

## 2023-06-20 DIAGNOSIS — F332 Major depressive disorder, recurrent severe without psychotic features: Secondary | ICD-10-CM

## 2023-06-20 NOTE — Progress Notes (Signed)
   06/19/23 2126  Psych Admission Type (Psych Patients Only)  Admission Status Voluntary  Psychosocial Assessment  Patient Complaints None  Eye Contact Fair  Facial Expression Other (Comment) (Appropriate)  Affect Appropriate to circumstance  Speech Logical/coherent  Interaction Assertive  Motor Activity Slow  Appearance/Hygiene Unremarkable  Behavior Characteristics Cooperative  Mood Pleasant  Aggressive Behavior  Effect No apparent injury  Thought Process  Coherency WDL  Content WDL  Delusions None reported or observed  Perception WDL  Hallucination None reported or observed  Judgment WDL  Confusion WDL  Danger to Self  Current suicidal ideation? Denies  Agreement Not to Harm Self Yes  Description of Agreement Verbal  Danger to Others  Danger to Others None reported or observed

## 2023-06-20 NOTE — Group Note (Signed)
 Date:  06/20/2023 Time:  1:23 PM  Group Topic/Focus:  Orientation:   The focus of this group is to educate the patient on the purpose and policies of crisis stabilization and provide a format to answer questions about their admission.  The group details unit policies and expectations of patients while admitted.    Participation Level:  Active  Participation Quality:  Appropriate  Affect:  Appropriate  Cognitive:  Alert and Appropriate  Insight: Appropriate and Good  Engagement in Group:  Engaged  Modes of Intervention:  Orientation  Additional Comments:     Alexis Frock 06/20/2023, 1:23 PM

## 2023-06-20 NOTE — Plan of Care (Signed)
   Problem: Education: Goal: Utilization of techniques to improve thought processes will improve Outcome: Progressing Goal: Knowledge of the prescribed therapeutic regimen will improve Outcome: Progressing

## 2023-06-20 NOTE — Progress Notes (Signed)
   06/20/23 1400  Psych Admission Type (Psych Patients Only)  Admission Status Voluntary  Psychosocial Assessment  Patient Complaints Anxiety  Eye Contact Fair  Facial Expression Other (Comment) (appropriate)  Affect Appropriate to circumstance  Speech Logical/coherent  Interaction Assertive  Motor Activity Slow  Appearance/Hygiene Unremarkable  Behavior Characteristics Cooperative  Mood Anxious;Pleasant  Thought Process  Coherency WDL  Content WDL  Delusions None reported or observed  Perception WDL  Hallucination None reported or observed  Judgment WDL  Confusion WDL  Danger to Self  Current suicidal ideation? Denies  Agreement Not to Harm Self Yes  Description of Agreement verbal  Danger to Others  Danger to Others None reported or observed

## 2023-06-20 NOTE — Group Note (Signed)
 Recreation Therapy Group Note   Group Topic:Coping Skills  Group Date: 06/20/2023 Start Time: 1400 End Time: 1440 Facilitators: Rosina Lowenstein, LRT, CTRS Location:  Dayroom  Group Description: Mind Map.  Patient was provided a blank template of a diagram with 32 blank boxes in a tiered system, branching from the center (similar to a bubble chart). LRT directed patients to label the middle of the diagram "Coping Skills". LRT and patients then came up with 8 different coping skills as examples. Pt were directed to record their coping skills in the 2nd tier boxes closest to the center.  Patients would then share their coping skills with the group as LRT wrote them out. LRT gave a handout of 99 different coping skills at the end of group.   Goal Area(s) Addressed: Patients will be able to define "coping skills". Patient will identify new coping skills.  Patient will increase communication.   Affect/Mood: N/A   Participation Level: Did not attend    Clinical Observations/Individualized Feedback: Patient did not attend group.   Plan: Continue to engage patient in RT group sessions 2-3x/week.   Rosina Lowenstein, LRT, CTRS 06/20/2023 4:40 PM

## 2023-06-20 NOTE — Plan of Care (Signed)
  Problem: Education: Goal: Knowledge of General Education information will improve Description: Including pain rating scale, medication(s)/side effects and non-pharmacologic comfort measures Outcome: Progressing   Problem: Health Behavior/Discharge Planning: Goal: Ability to manage health-related needs will improve Outcome: Progressing   Problem: Coping: Goal: Level of anxiety will decrease Outcome: Progressing   Problem: Safety: Goal: Ability to disclose and discuss suicidal ideas will improve Outcome: Progressing Goal: Ability to identify and utilize support systems that promote safety will improve Outcome: Progressing   Problem: Self-Concept: Goal: Will verbalize positive feelings about self Outcome: Progressing Goal: Level of anxiety will decrease Outcome: Progressing

## 2023-06-20 NOTE — Progress Notes (Signed)
   06/20/23 0615  15 Minute Checks  Location Bedroom  Visual Appearance Calm  Behavior Sleeping  Sleep (Behavioral Health Patients Only)  Calculate sleep? (Click Yes once per 24 hr at 0600 safety check) Yes  Documented sleep last 24 hours 11.75

## 2023-06-20 NOTE — Group Note (Signed)
 Date:  06/20/2023 Time:  12:44 AM  Group Topic/Focus:  Wrap-Up Group:   The focus of this group is to help patients review their daily goal of treatment and discuss progress on daily workbooks.    Participation Level:  Active  Participation Quality:  Appropriate  Affect:  Appropriate  Cognitive:  Alert  Insight: Good  Engagement in Group:  Engaged  Modes of Intervention:  Discussion  Additional Comments:    Alex Underwood 06/20/2023, 12:44 AM

## 2023-06-20 NOTE — Group Note (Signed)
 Date:  06/20/2023 Time:  10:25 PM  Group Topic/Focus:  Goals Group:   The focus of this group is to help patients establish daily goals to achieve during treatment and discuss how the patient can incorporate goal setting into their daily lives to aide in recovery.    Participation Level:  Did Not Attend  Participation Quality:   Did Not Attend  Affect:   Did Not Attend  Cognitive:   Did Not Attend  Insight: None  Engagement in Group:  None  Modes of Intervention:  Education  Additional Comments:    Garry Heater 06/20/2023, 10:25 PM

## 2023-06-20 NOTE — Progress Notes (Signed)
 [facility] MD Progress Note  06/20/2023 10:09 PM Daily Crate  MRN:  [id number] Subjective:  78 year old male presents emergency department with attempted suicide. Patient presents as a level 1 trauma due to multiple lacerations on the upper extremities with tourniquet in place. Report from EMS is that patient was found sitting in a lawn chair with multiple lacerations, dark blood pooling. Tourniquets were placed and he was transported here emergently. Patient is admitted to [facility] unit with Q15 min safety monitoring. Multidisciplinary team approach is offered. Medication management; group/milieu therapy is offered.    Patient was seen today for assessment.  Today on interview patient is noted to be tired.  He reports that yesterday the nurse gave him some prune juice and after that he had a big bowel movement which exhausted him.  He reports feeling low mood because of physical tiredness.  He talks about his Pristiq being washed away due to the big bowel movement.  Provider educated about SSRIs reaching therapeutic level in 4 to 6 weeks.  He denies SI/HI/intent/plan.  He is requesting to be sent home as his daughter from [place] came to stay with him for few weeks and he feels comfortable with her in the house.  Provider discussed about his ongoing mood issues and the need for companionship and the possibility of him relapsing back on suicidal thoughts once the daughter leaves.  He understands the need of further stabilization his medications.  He denies auditory/visual hallucinations.  Principal Problem: MDD (major depressive disorder), recurrent severe, without psychosis (HCC) Diagnosis: Principal Problem:   MDD (major depressive disorder), recurrent severe, without psychosis (HCC)  Total Time spent with patient: 20 minutes  Past Psychiatric History: Psychiatric History:  Information collected from patient   Prev Dx/Sx: Depression and anxiety Current Psych Provider: Unable to recall but looks like  PCP is managing the medications Home Meds (current): Pristiq 50 mg Previous Med Trials: Unable to recall Therapy: None reported   Prior Psych Hospitalization: Few years ago unable to recall the details Prior Self Harm: None reported, last suicide attempt was 2 weeks ago before this plan as documented in H&P Prior Violence: None reported   Family Psych History: Multiple family members with depression and anxiety and alcohol use Family Hx suicide: His brother died by suicide    Past Medical History: History reviewed. No pertinent past medical history. History reviewed. No pertinent surgical history. Family History: History reviewed. No pertinent family history. Family Psychiatric  History: Multiple family members with depression and anxiety and alcohol use Family Hx suicide: His brother died by suicide Social History:  Social History   Substance and Sexual Activity  Alcohol Use None     Social History   Substance and Sexual Activity  Drug Use Not on file    Social History   Socioeconomic History   Marital status: Unknown    Spouse name: Not on file   Number of children: Not on file   Years of education: Not on file   Highest education level: Not on file  Occupational History   Not on file  Tobacco Use   Smoking status: Never   Smokeless tobacco: Never  Substance and Sexual Activity   Alcohol use: Not on file   Drug use: Not on file   Sexual activity: Not on file  Other Topics Concern   Not on file  Social History Narrative   Not on file   Social Drivers of Health   Financial Resource Strain: Not on file  Food Insecurity: No Food Insecurity (06/16/2023)   Hunger Vital Sign    Worried About Running Out of Food in the Last Year: Never true    Ran Out of Food in the Last Year: Never true  Transportation Needs: No Transportation Needs (06/16/2023)   PRAPARE - Administrator, Civil Service (Medical): No    Lack of Transportation (Non-Medical): No  Physical  Activity: Not on file  Stress: Not on file  Social Connections: Moderately Integrated (06/16/2023)   Social Connection and Isolation Panel [NHANES]    Frequency of Communication with Friends and Family: Three times a week    Frequency of Social Gatherings with Friends and Family: Once a week    Attends Religious Services: More than 4 times per year    Active Member of [employer] or Organizations: Yes    Attends Banker Meetings: More than 4 times per year    Marital Status: Widowed   Additional Social History:                         Sleep: Poor  Appetite:  Good  Current Medications: Current Facility-Administered Medications  Medication Dose Route Frequency Provider Last Rate Last Admin   acetaminophen (TYLENOL) tablet 650 mg  650 mg Oral Q6H PRN Lenox Ponds, NP       alum & mag hydroxide-simeth (MAALOX/MYLANTA) 200-200-20 MG/5ML suspension 30 mL  30 mL Oral Q4H PRN Lenox Ponds, NP       amLODipine (NORVASC) tablet 5 mg  5 mg Oral Daily Sanjuana Mae, MD   5 mg at 06/20/23 1050   desvenlafaxine (PRISTIQ) 24 hr tablet 50 mg  50 mg Oral Daily Sanjuana Mae, MD   50 mg at 06/20/23 1050   magnesium hydroxide (MILK OF MAGNESIA) suspension 30 mL  30 mL Oral Daily PRN Lenox Ponds, NP       melatonin tablet 2.5 mg  2.5 mg Oral QHS PRN Onuoha, Chinwendu V, NP   2.5 mg at 06/20/23 2141   OLANZapine (ZYPREXA) injection 5 mg  5 mg Intramuscular TID PRN Lenox Ponds, NP       OLANZapine zydis (ZYPREXA) disintegrating tablet 5 mg  5 mg Oral TID PRN Lenox Ponds, NP       Testosterone 1.62 % GEL 1 packet  1 packet Transdermal Daily Sanjuana Mae, MD   1 packet at 06/20/23 1051   traZODone (DESYREL) tablet 50 mg  50 mg Oral QHS Sanjuana Mae, MD   50 mg at 06/20/23 2138    Lab Results:  No results found for this or any previous visit (from the past 48 hours).   Blood Alcohol level:  Lab Results  Component Value Date   ETH  <10 06/15/2023    Metabolic Disorder Labs: No results found for: "HGBA1C", "MPG" No results found for: "PROLACTIN" No results found for: "CHOL", "TRIG", "HDL", "CHOLHDL", "VLDL", "LDLCALC"  Physical Findings: AIMS:  , ,  ,  ,    CIWA:    COWS:     Musculoskeletal: Strength & Muscle Tone: within normal limits Gait & Station: normal Patient leans: N/A  Psychiatric Specialty Exam:  Presentation  General Appearance:  Appropriate for Environment; Casual  Eye Contact: Good  Speech: Clear and Coherent  Speech Volume: Normal  Handedness: Right   Mood and Affect  Mood: Depressed; Anxious  Affect: Depressed; Flat   Thought Process  Thought Processes: Coherent  Descriptions of  Associations:Intact  Orientation:Full (Time, Place and Person)  Thought Content:Logical  History of Schizophrenia/Schizoaffective disorder:No  Duration of Psychotic Symptoms:No data recorded Hallucinations:Hallucinations: None  Ideas of Reference:None  Suicidal Thoughts:Suicidal Thoughts: No  Homicidal Thoughts:Homicidal Thoughts: No   Sensorium  Memory: Immediate Fair; Recent Fair; Remote Fair  Judgment: Impaired  Insight: Shallow   Executive Functions  Concentration: Fair  Attention Span: Fair  Recall: [employer] of Knowledge: Fair  Language: Fair   Psychomotor Activity  Psychomotor Activity: Psychomotor Activity: Normal    Assets  Assets: Communication Skills; Desire for Improvement; Resilience; Social Support   Sleep  Sleep: Sleep: Fair    Physical Exam: Physical Exam HENT:     Nose: Nose normal.     Mouth/Throat:     Mouth: Mucous membranes are moist.  Eyes:     Extraocular Movements: Extraocular movements intact.     Pupils: Pupils are equal, round, and reactive to light.  Neurological:     General: No focal deficit present.     Mental Status: He is alert.     Motor: No weakness.  Psychiatric:        Attention and  Perception: Attention and perception normal.        Mood and Affect: Mood and affect normal. Mood is not depressed.        Speech: Speech normal.        Behavior: Behavior normal.        Thought Content: Thought content normal.        Cognition and Memory: Cognition and memory normal.        Judgment: Judgment normal.    Review of Systems  Eyes: Negative.   Respiratory: Negative.    Cardiovascular:         Blood pressure  90/84  Gastrointestinal: Negative.   Genitourinary: Negative.   Musculoskeletal: Negative.   Neurological: Negative.  Negative for dizziness and weakness.  Endo/Heme/Allergies: Negative.   Psychiatric/Behavioral: Negative.  Negative for depression.    Blood pressure (!) 72/63, pulse 82, temperature (!) 97.5 F (36.4 C), resp. rate 14, height 5\' 9"  (1.753 m), weight 98 kg, SpO2 100%. Body mass index is 31.9 kg/m.   Treatment Plan Summary 06/19/2023: Today patient is brighter in affect. He does not appear significantly depressed. He denies SI/HI/AVH. He is now denying dizziness, educated him again on medication changes. reports no other side effects of medications and has no further concerns.  HR today 78  EKG QTC 467 Orth stats were positive, Encourage fluids, and continue to monitor closely, will give fluids if needed    Principal Diagnosis: MDD (major depressive disorder), recurrent severe, without psychosis (HCC) Diagnosis:  Principal Problem:   MDD (major depressive disorder), recurrent severe, without psychosis (HCC)    Safety and Monitoring: Patient is on IVC             -- Involuntary admission to inpatient psychiatric unit for safety, stabilization and treatment             -- Daily contact with patient to assess and evaluate symptoms and progress in treatment             -- Patient's case to be discussed in multi-disciplinary team meeting             -- Observation Level: q15 minute checks             -- Vital signs:  q12 hours             --  Precautions: suicide, elopement, and assault  2. Psychiatric Diagnoses and Treatment:  Initiate Pristique 50mg  daily P O for depression and  Trazodone 50 mg Po for sleep disturbances               -- Encouraged patient to participate in unit milieu and in scheduled group therapies     3. Medical Issues Being Addressed:     DC Coreg.  Reduce amlodipine to 5 mg patient has low blood pressure  Episode today. Resume testosterone.   Ordered orthostatic vitals, EKG, CBC, BMP,  4. Discharge Planning:              -- Social work and case management to assist with discharge planning and identification of hospital follow-up needs prior to discharge             -- Estimated LOS: 5-7 days             -- Discharge Concerns: Need to establish a safety plan; Medication compliance and effectiveness             -- Discharge Goals: Return home with outpatient referrals follow ups   Physician Treatment Plan for Primary Diagnosis: MDD (major depressive disorder), recurrent severe, without psychosis (HCC) Long Term Goal(s): Improvement in symptoms so as ready for discharge   Short Term Goals: Ability to identify changes in lifestyle to reduce recurrence of condition will improve, Ability to verbalize feelings will improve, Ability to disclose and discuss suicidal ideas, Ability to demonstrate self-control will improve, and Ability to identify and develop effective coping behaviors will improve   Physician Treatment Plan for Secondary Diagnosis: Principal Problem:   MDD (major depressive disorder), recurrent severe, without psychosis (HCC)   Long Term Goal(s): Improvement in symptoms so as ready for discharge   Short Term Goals: Ability to identify changes in lifestyle to reduce recurrence of condition will improve, Ability to verbalize feelings will improve, Ability to disclose and discuss suicidal ideas, Ability to demonstrate self-control will improve, Ability to identify and develop effective coping  behaviors will improve, Ability to maintain clinical measurements within normal limits will improve, Compliance with prescribed medications will improve, and Ability to identify triggers associated with substance abuse/mental health issues will improve   I certify that inpatient services furnished can reasonably be expected to improve the patient's condition.          Verner Chol, MD 06/20/2023, 10:09 PM

## 2023-06-21 DIAGNOSIS — F332 Major depressive disorder, recurrent severe without psychotic features: Secondary | ICD-10-CM | POA: Diagnosis not present

## 2023-06-21 MED ORDER — TRAZODONE HCL 50 MG PO TABS: 50.0000 mg | ORAL_TABLET | Freq: Every day | ORAL | 0 refills | Status: DC

## 2023-06-21 MED ORDER — TESTOSTERONE 1.62 % TD GEL: 1.0000 | Freq: Every day | TRANSDERMAL | 0 refills | Status: DC

## 2023-06-21 MED ORDER — DESVENLAFAXINE SUCCINATE ER 50 MG PO TB24: 50.0000 mg | ORAL_TABLET | Freq: Every day | ORAL | 0 refills | Status: DC

## 2023-06-21 MED ORDER — MELATONIN 5 MG PO TABS: 2.5000 mg | ORAL_TABLET | Freq: Every evening | ORAL | 0 refills | Status: DC | PRN

## 2023-06-21 MED ORDER — AMLODIPINE BESYLATE 5 MG PO TABS: 5.0000 mg | ORAL_TABLET | Freq: Every day | ORAL | 0 refills | Status: DC

## 2023-06-21 NOTE — Progress Notes (Signed)
   06/20/23 2300  Psych Admission Type (Psych Patients Only)  Admission Status Voluntary  Psychosocial Assessment  Patient Complaints Anxiety  Eye Contact Fair  Facial Expression Flat  Affect Appropriate to circumstance  Speech Logical/coherent  Interaction Assertive  Motor Activity Slow  Appearance/Hygiene Unremarkable  Behavior Characteristics Cooperative  Mood Anxious;Pleasant  Aggressive Behavior  Effect No apparent injury  Thought Process  Coherency WDL  Content WDL  Delusions None reported or observed  Perception WDL  Hallucination None reported or observed  Judgment WDL  Confusion WDL  Danger to Self  Current suicidal ideation? Denies  Agreement Not to Harm Self Yes  Description of Agreement verbal  Danger to Others  Danger to Others None reported or observed

## 2023-06-21 NOTE — [facility] Suicide Risk Assessment (Signed)
 [facility] Discharge Suicide Risk Assessment   Principal Problem: MDD (major depressive disorder), recurrent severe, without psychosis (HCC) Discharge Diagnoses: Principal Problem:   MDD (major depressive disorder), recurrent severe, without psychosis (HCC)   Total Time spent with patient: 30 minutes  Musculoskeletal: Strength & Muscle Tone: within normal limits Gait & Station: normal Patient leans: N/A  Psychiatric Specialty Exam  Presentation  General Appearance:  Appropriate for Environment; Casual  Eye Contact: Fair  Speech: Clear and Coherent  Speech Volume: Normal  Handedness: Right   Mood and Affect  Mood: Anxious  Duration of Depression Symptoms: Greater than two weeks  Affect: Appropriate   Thought Process  Thought Processes: Coherent  Descriptions of Associations:Intact  Orientation:Full (Time, Place and Person)  Thought Content:Logical  History of Schizophrenia/Schizoaffective disorder:No  Duration of Psychotic Symptoms:No data recorded Hallucinations:Hallucinations: None  Ideas of Reference:None  Suicidal Thoughts:Suicidal Thoughts: No  Homicidal Thoughts:Homicidal Thoughts: No   Sensorium  Memory: Immediate Fair; Recent Fair; Remote Fair  Judgment: Impaired  Insight: Shallow   Executive Functions  Concentration: Fair  Attention Span: Fair  Recall: [employer] of Knowledge: Fair  Language: Fair   Psychomotor Activity  Psychomotor Activity: Psychomotor Activity: Normal   Assets  Assets: Communication Skills; Desire for Improvement; Resilience; Social Support   Sleep  Sleep: Sleep: Fair   Physical Exam: Physical Exam ROS Blood pressure 124/66, pulse 79, temperature 97.9 F (36.6 C), resp. rate 18, height 5\' 9"  (1.753 m), weight 98 kg, SpO2 99%. Body mass index is 31.9 kg/m.  Mental Status Per Nursing Assessment::   On Admission:  NA  Demographic Factors:  Male and Caucasian  Loss  Factors: Decrease in vocational status  Historical Factors: Impulsivity  Risk Reduction Factors:   Sense of responsibility to family, Religious beliefs about death, Living with another person, especially a relative, Positive social support, Positive therapeutic relationship, and Positive coping skills or problem solving skills  Continued Clinical Symptoms:  Depression:   Insomnia  Cognitive Features That Contribute To Risk:  None    Suicide Risk:  Minimal: No identifiable suicidal ideation.  Patients presenting with no risk factors but with morbid ruminations; may be classified as minimal risk based on the severity of the depressive symptoms   Follow-up Information     [facility]. Go on 07/11/2023.   Why: Your appointment is scheduled for 07/11/23 at 9:00 AM. Please arrive by 8:45 AM and remember to bring your insurance card.                Plan Of Care/Follow-up recommendations:  Activity:  AS tolerated  Verner Chol, MD 06/21/2023, 10:12 PM

## 2023-06-21 NOTE — Group Note (Signed)
 Recreation Therapy Group Note   Group Topic:Coping Skills  Group Date: 06/21/2023 Start Time: 1400 End Time: 1500 Facilitators: Rosina Lowenstein, LRT, CTRS Location: Courtyard  Group Description: Music. Patients encouraged to name their favorite song(s) for LRT to play song through speaker for group to hear. Patient educated on the definition of leisure and the importance of having different leisure interests outside of the hospital. Group discussed how leisure activities can often be used as Pharmacologist and that listening to music is one example.   Goal Area(s) Addressed:  Patient will identify a current leisure interest.  Patient will practice making a positive decision. Patient will have the opportunity to try a new leisure activity.   Affect/Mood: Appropriate   Participation Level: Active and Engaged   Participation Quality: Independent   Behavior: Calm and Cooperative   Speech/Thought Process: Coherent   Insight: Good   Judgement: Good   Modes of Intervention: Education, Exploration, Music, and Rapport Building   Patient Response to Interventions:  Attentive, Engaged, and Receptive   Education Outcome:  Acknowledges education   Clinical Observations/Individualized Feedback: [place] was active in their participation of session activities and group discussion. Pt identified "[place] country" as his favorite type of music. Pt interacted well with LRT and peers duration of session.    Plan: Continue to engage patient in RT group sessions 2-3x/week.   Rosina Lowenstein, LRT, CTRS 06/21/2023 4:54 PM

## 2023-06-21 NOTE — Plan of Care (Signed)
   06/21/23 2200  Psych Admission Type (Psych Patients Only)  Admission Status Voluntary  Psychosocial Assessment  Patient Complaints Anxiety  Eye Contact Fair  Facial Expression Flat  Affect Appropriate to circumstance  Speech Logical/coherent  Interaction Assertive  Motor Activity Slow  Appearance/Hygiene Unremarkable  Behavior Characteristics Cooperative  Mood Anxious  Thought Process  Coherency WDL  Content WDL  Delusions None reported or observed  Perception WDL  Hallucination None reported or observed  Judgment WDL  Confusion WDL  Danger to Self  Current suicidal ideation? Denies  Agreement Not to Harm Self Yes  Description of Agreement verbal  Danger to Others  Danger to Others None reported or observed   Problem: Education: Goal: Knowledge of General Education information will improve Description: Including pain rating scale, medication(s)/side effects and non-pharmacologic comfort measures Outcome: Progressing   Problem: Health Behavior/Discharge Planning: Goal: Ability to manage health-related needs will improve Outcome: Progressing   Problem: Coping: Goal: Level of anxiety will decrease Outcome: Progressing   Problem: Education: Goal: Utilization of techniques to improve thought processes will improve Outcome: Progressing Goal: Knowledge of the prescribed therapeutic regimen will improve Outcome: Progressing

## 2023-06-21 NOTE — Progress Notes (Signed)
 [facility] MD Progress Note  06/21/2023 10:05 PM Alex Underwood  MRN:  [id number] Subjective:  78 year old male presents emergency department with attempted suicide. Patient presents as a level 1 trauma due to multiple lacerations on the upper extremities with tourniquet in place. Report from EMS is that patient was found sitting in a lawn chair with multiple lacerations, dark blood pooling. Tourniquets were placed and he was transported here emergently. Patient is admitted to [facility] unit with Q15 min safety monitoring. Multidisciplinary team approach is offered. Medication management; group/milieu therapy is offered.    Patient was seen today for assessment.  Today on interview patient is noted to be resting in bed.  He reports that he is feeling a lot better today and he has been drinking some Gatorade throughout the day.  He reports fair appetite and sleep.  He talks about having problems with loud sounds and news about politics.  When asked about the details about the news that bother him he talks about the recent politics that has disclosed the corruption of the government level which bothers him.  He talks about getting those scam calls with they want to access his information.  He denies SI/HI/intent/plan.  He denies auditory/visual hallucinations.  He understands that the Pristiq dosage can be titrated very slowly by his outpatient psychiatrist.  Provider spoke with patient's 2 daughters Crystal and Huntley.  They did acknowledge and confirmed that patient always had problems with loud sounds and noises, always had problems with news on politics.  Daughters explained that the plan is for the younger daughter to stay with the patient for few weeks until he gets stronger and when they go back to [place] patient will be living with his other daughter who is in [place].  Provider discussed about setting him up with outpatient mental health services.  Principal Problem: MDD (major depressive disorder),  recurrent severe, without psychosis (HCC) Diagnosis: Principal Problem:   MDD (major depressive disorder), recurrent severe, without psychosis (HCC)  Total Time spent with patient: 20 minutes  Past Psychiatric History: Psychiatric History:  Information collected from patient   Prev Dx/Sx: Depression and anxiety Current Psych Provider: Unable to recall but looks like PCP is managing the medications Home Meds (current): Pristiq 50 mg Previous Med Trials: Unable to recall Therapy: None reported   Prior Psych Hospitalization: Few years ago unable to recall the details Prior Self Harm: None reported, last suicide attempt was 2 weeks ago before this plan as documented in H&P Prior Violence: None reported   Family Psych History: Multiple family members with depression and anxiety and alcohol use Family Hx suicide: His brother died by suicide    Past Medical History: History reviewed. No pertinent past medical history. History reviewed. No pertinent surgical history. Family History: History reviewed. No pertinent family history. Family Psychiatric  History: Multiple family members with depression and anxiety and alcohol use Family Hx suicide: His brother died by suicide Social History:  Social History   Substance and Sexual Activity  Alcohol Use None     Social History   Substance and Sexual Activity  Drug Use Not on file    Social History   Socioeconomic History   Marital status: Unknown    Spouse name: Not on file   Number of children: Not on file   Years of education: Not on file   Highest education level: Not on file  Occupational History   Not on file  Tobacco Use   Smoking status: Never  Smokeless tobacco: Never  Substance and Sexual Activity   Alcohol use: Not on file   Drug use: Not on file   Sexual activity: Not on file  Other Topics Concern   Not on file  Social History Narrative   Not on file   Social Drivers of Health   Financial Resource Strain: Not  on file  Food Insecurity: No Food Insecurity (06/16/2023)   Hunger Vital Sign    Worried About Running Out of Food in the Last Year: Never true    Ran Out of Food in the Last Year: Never true  Transportation Needs: No Transportation Needs (06/16/2023)   PRAPARE - Administrator, Civil Service (Medical): No    Lack of Transportation (Non-Medical): No  Physical Activity: Not on file  Stress: Not on file  Social Connections: Moderately Integrated (06/16/2023)   Social Connection and Isolation Panel [NHANES]    Frequency of Communication with Friends and Family: Three times a week    Frequency of Social Gatherings with Friends and Family: Once a week    Attends Religious Services: More than 4 times per year    Active Member of [employer] or Organizations: Yes    Attends Banker Meetings: More than 4 times per year    Marital Status: Widowed   Additional Social History:                         Sleep: Poor  Appetite:  Good  Current Medications: Current Facility-Administered Medications  Medication Dose Route Frequency Provider Last Rate Last Admin   acetaminophen (TYLENOL) tablet 650 mg  650 mg Oral Q6H PRN Lenox Ponds, NP       alum & mag hydroxide-simeth (MAALOX/MYLANTA) 200-200-20 MG/5ML suspension 30 mL  30 mL Oral Q4H PRN Lenox Ponds, NP       amLODipine (NORVASC) tablet 5 mg  5 mg Oral Daily Sanjuana Mae, MD   5 mg at 06/21/23 0952   desvenlafaxine (PRISTIQ) 24 hr tablet 50 mg  50 mg Oral Daily Sanjuana Mae, MD   50 mg at 06/21/23 0953   magnesium hydroxide (MILK OF MAGNESIA) suspension 30 mL  30 mL Oral Daily PRN Lenox Ponds, NP       melatonin tablet 2.5 mg  2.5 mg Oral QHS PRN Onuoha, Chinwendu V, NP   2.5 mg at 06/21/23 2105   OLANZapine (ZYPREXA) injection 5 mg  5 mg Intramuscular TID PRN Lenox Ponds, NP       OLANZapine zydis (ZYPREXA) disintegrating tablet 5 mg  5 mg Oral TID PRN Lenox Ponds, NP        Testosterone 1.62 % GEL 1 packet  1 packet Transdermal Daily Sanjuana Mae, MD   1 packet at 06/20/23 1051   traZODone (DESYREL) tablet 50 mg  50 mg Oral QHS Sanjuana Mae, MD   50 mg at 06/21/23 2105    Lab Results:  No results found for this or any previous visit (from the past 48 hours).   Blood Alcohol level:  Lab Results  Component Value Date   ETH <10 06/15/2023    Metabolic Disorder Labs: No results found for: "HGBA1C", "MPG" No results found for: "PROLACTIN" No results found for: "CHOL", "TRIG", "HDL", "CHOLHDL", "VLDL", "LDLCALC"  Physical Findings: AIMS:  , ,  ,  ,    CIWA:    COWS:     Musculoskeletal: Strength & Muscle Tone: within normal  limits Gait & Station: normal Patient leans: N/A  Psychiatric Specialty Exam:  Presentation  General Appearance:  Appropriate for Environment; Casual  Eye Contact: Fair  Speech: Clear and Coherent  Speech Volume: Normal  Handedness: Right   Mood and Affect  Mood: Anxious  Affect: Appropriate   Thought Process  Thought Processes: Coherent  Descriptions of Associations:Intact  Orientation:Full (Time, Place and Person)  Thought Content:Logical  History of Schizophrenia/Schizoaffective disorder:No  Duration of Psychotic Symptoms:No data recorded Hallucinations:Hallucinations: None  Ideas of Reference:None  Suicidal Thoughts:Suicidal Thoughts: No  Homicidal Thoughts:Homicidal Thoughts: No   Sensorium  Memory: Immediate Fair; Recent Fair; Remote Fair  Judgment: Impaired  Insight: Shallow   Executive Functions  Concentration: Fair  Attention Span: Fair  Recall: [employer] of Knowledge: Fair  Language: Fair   Psychomotor Activity  Psychomotor Activity: Psychomotor Activity: Normal    Assets  Assets: Communication Skills; Desire for Improvement; Resilience; Social Support   Sleep  Sleep: Sleep: Fair    Physical Exam: Physical Exam HENT:      Nose: Nose normal.     Mouth/Throat:     Mouth: Mucous membranes are moist.  Eyes:     Extraocular Movements: Extraocular movements intact.     Pupils: Pupils are equal, round, and reactive to light.  Neurological:     General: No focal deficit present.     Mental Status: He is alert.     Motor: No weakness.  Psychiatric:        Attention and Perception: Attention and perception normal.        Mood and Affect: Mood and affect normal. Mood is not depressed.        Speech: Speech normal.        Behavior: Behavior normal.        Thought Content: Thought content normal.        Cognition and Memory: Cognition and memory normal.        Judgment: Judgment normal.    Review of Systems  Eyes: Negative.   Respiratory: Negative.    Cardiovascular:         Blood pressure  90/84  Gastrointestinal: Negative.   Genitourinary: Negative.   Musculoskeletal: Negative.   Neurological: Negative.  Negative for dizziness and weakness.  Endo/Heme/Allergies: Negative.   Psychiatric/Behavioral: Negative.  Negative for depression.    Blood pressure 124/66, pulse 79, temperature 97.9 F (36.6 C), resp. rate 18, height 5\' 9"  (1.753 m), weight 98 kg, SpO2 99%. Body mass index is 31.9 kg/m.   Treatment Plan Summary 06/19/2023: Today patient is brighter in affect. He does not appear significantly depressed. He denies SI/HI/AVH. He is now denying dizziness, educated him again on medication changes. reports no other side effects of medications and has no further concerns.  HR today 78  EKG QTC 467 Orth stats were positive, Encourage fluids, and continue to monitor closely, will give fluids if needed    Principal Diagnosis: MDD (major depressive disorder), recurrent severe, without psychosis (HCC) Diagnosis:  Principal Problem:   MDD (major depressive disorder), recurrent severe, without psychosis (HCC)    Safety and Monitoring: Patient is on IVC             -- Involuntary admission to inpatient  psychiatric unit for safety, stabilization and treatment             -- Daily contact with patient to assess and evaluate symptoms and progress in treatment             --  Patient's case to be discussed in multi-disciplinary team meeting             -- Observation Level: q15 minute checks             -- Vital signs:  q12 hours             -- Precautions: suicide, elopement, and assault  2. Psychiatric Diagnoses and Treatment:  Pristique 50mg  daily P O for depression and  Trazodone 50 mg Po for sleep disturbances               -- Encouraged patient to participate in unit milieu and in scheduled group therapies     3. Medical Issues Being Addressed:     DC Coreg.  Reduce amlodipine to 5 mg patient has low blood pressure  Episode today. Resume testosterone.   Ordered orthostatic vitals, EKG, CBC, BMP,  4. Discharge Planning:              -- Social work and case management to assist with discharge planning and identification of hospital follow-up needs prior to discharge             -- Estimated LOS: 5-7 days             -- Discharge Concerns: Need to establish a safety plan; Medication compliance and effectiveness             -- Discharge Goals: Return home with outpatient referrals follow ups   Physician Treatment Plan for Primary Diagnosis: MDD (major depressive disorder), recurrent severe, without psychosis (HCC) Long Term Goal(s): Improvement in symptoms so as ready for discharge   Short Term Goals: Ability to identify changes in lifestyle to reduce recurrence of condition will improve, Ability to verbalize feelings will improve, Ability to disclose and discuss suicidal ideas, Ability to demonstrate self-control will improve, and Ability to identify and develop effective coping behaviors will improve   Physician Treatment Plan for Secondary Diagnosis: Principal Problem:   MDD (major depressive disorder), recurrent severe, without psychosis (HCC)   Long Term Goal(s): Improvement in  symptoms so as ready for discharge   Short Term Goals: Ability to identify changes in lifestyle to reduce recurrence of condition will improve, Ability to verbalize feelings will improve, Ability to disclose and discuss suicidal ideas, Ability to demonstrate self-control will improve, Ability to identify and develop effective coping behaviors will improve, Ability to maintain clinical measurements within normal limits will improve, Compliance with prescribed medications will improve, and Ability to identify triggers associated with substance abuse/mental health issues will improve   I certify that inpatient services furnished can reasonably be expected to improve the patient's condition.          Verner Chol, MD 06/21/2023, 10:05 PM

## 2023-06-21 NOTE — Progress Notes (Signed)
   06/21/23 1400  Psych Admission Type (Psych Patients Only)  Admission Status Voluntary  Psychosocial Assessment  Patient Complaints Anxiety  Eye Contact Fair  Facial Expression Flat  Affect Appropriate to circumstance  Speech Logical/coherent  Interaction Assertive  Motor Activity Slow  Appearance/Hygiene In scrubs  Behavior Characteristics Cooperative  Mood Anxious  Thought Process  Coherency WDL  Content WDL  Delusions None reported or observed  Perception WDL  Hallucination None reported or observed  Judgment WDL  Confusion WDL  Danger to Self  Current suicidal ideation? Denies  Agreement Not to Harm Self Yes  Description of Agreement Verbal  Danger to Others  Danger to Others None reported or observed   Bilateral arms wound care done.

## 2023-06-21 NOTE — Progress Notes (Signed)
   06/21/23 0615  15 Minute Checks  Location Bedroom  Visual Appearance Calm  Behavior Sleeping  Sleep (Behavioral Health Patients Only)  Calculate sleep? (Click Yes once per 24 hr at 0600 safety check) Yes  Documented sleep last 24 hours 10.25

## 2023-06-22 DIAGNOSIS — F332 Major depressive disorder, recurrent severe without psychotic features: Secondary | ICD-10-CM | POA: Diagnosis not present

## 2023-06-22 NOTE — Progress Notes (Signed)
  [facility] Adult Case Management Discharge Plan :  Will you be returning to the same living situation after discharge:  Yes,  pt will return home to live with family  At discharge, do you have transportation home?: Yes,  pt's daughter will pick him up  Do you have the ability to pay for your medications: Yes,  MEDICARE / MEDICARE PART A AND B  Release of information consent forms completed and in the chart;  Patient's signature needed at discharge.  Patient to Follow up at:  Follow-up Information     [facility]. Go on 07/11/2023.   Why: Your appointment is scheduled for 07/11/23 at 9:00 AM. Please arrive by 8:45 AM and remember to bring your insurance card.                Next level of care provider has access to [facility] Link:no  Safety Planning and Suicide Prevention discussed: Valentino Hue,  Miles Costain, daughter, [phone]      Has patient been referred to the Quitline?: Patient does not use tobacco/nicotine products  Patient has been referred for addiction treatment: No known substance use disorder.  [address] LCSWA 06/22/2023, 9:12 AM

## 2023-06-22 NOTE — Group Note (Signed)
 Date:  06/22/2023 Time:  11:26 AM  Group Topic/Focus:  Guided Imagery/Coloring Activity  The purpose of this group is to allow patients to listen to soothing music while coloring a positive worksheets.    Participation Level:  Did Not Attend  Participation Quality:    Affect:    Cognitive:    Insight:   Engagement in Group:    Modes of Intervention:    Additional Comments:  Did not attend   Bradyn Soward T Cacie Gaskins 06/22/2023, 11:26 AM

## 2023-06-22 NOTE — Progress Notes (Signed)
   06/22/23 1100  Psych Admission Type (Psych Patients Only)  Admission Status Voluntary  Psychosocial Assessment  Patient Complaints Anxiety  Eye Contact Fair  Facial Expression Flat  Affect Appropriate to circumstance  Speech Logical/coherent  Interaction Assertive  Motor Activity Slow  Appearance/Hygiene In scrubs  Behavior Characteristics Cooperative;Anxious  Thought Process  Coherency WDL  Content WDL  Delusions None reported or observed  Perception WDL  Hallucination None reported or observed  Judgment WDL  Confusion WDL  Danger to Self  Current suicidal ideation? Denies  Agreement Not to Harm Self Yes  Description of Agreement Verbal  Danger to Others  Danger to Others None reported or observed   Patient discharged at this time in care of family. All discharge instructions given and read to patient with acknowledgement. CPAP and medications picked up from pharmacy and returned to patient.

## 2023-06-22 NOTE — Care Management Important Message (Signed)
 Important Message  Patient Details  Name: Alex Underwood MRN: [id number] Date of Birth: [birth year]   Important Message Given:  Yes - Medicare IM     Elza Rafter, LCSWA 06/22/2023, 9:14 AM

## 2023-06-22 NOTE — Discharge Summary (Signed)
 Physician Discharge Summary Note  Patient:  Alex Underwood is an 78 y.o., male MRN:  [id number] DOB:  Jan 15, 1946 Patient phone:  [phone] (home)  Patient address:   [address] [place] [address],    Date of Admission:  06/16/2023 Date of Discharge: 06/22/23  Reason for Admission:  78 year old male presents emergency department with attempted suicide. Patient presents as a level 1 trauma due to multiple lacerations on the upper extremities with tourniquet in place. Report from EMS is that patient was found sitting in a lawn chair with multiple lacerations, dark blood pooling. Tourniquets were placed and he was transported here emergently. Patient is admitted to [facility] unit with Q15 min safety monitoring. Multidisciplinary team approach is offered. Medication management; group/milieu therapy is offered.   Principal Problem: MDD (major depressive disorder), recurrent severe, without psychosis (HCC) Discharge Diagnoses: Principal Problem:   MDD (major depressive disorder), recurrent severe, without psychosis (HCC)   Past Psychiatric History: see h&p  Family Psychiatric  History: see h&p Social History:  Social History   Substance and Sexual Activity  Alcohol Use None     Social History   Substance and Sexual Activity  Drug Use Not on file    Social History   Socioeconomic History   Marital status: Unknown    Spouse name: Not on file   Number of children: Not on file   Years of education: Not on file   Highest education level: Not on file  Occupational History   Not on file  Tobacco Use   Smoking status: Never   Smokeless tobacco: Never  Substance and Sexual Activity   Alcohol use: Not on file   Drug use: Not on file   Sexual activity: Not on file  Other Topics Concern   Not on file  Social History Narrative   Not on file   Social Drivers of Health   Financial Resource Strain: Not on file  Food Insecurity: No Food Insecurity (06/16/2023)   Hunger Vital Sign     Worried About Running Out of Food in the Last Year: Never true    Ran Out of Food in the Last Year: Never true  Transportation Needs: No Transportation Needs (06/16/2023)   PRAPARE - Administrator, Civil Service (Medical): No    Lack of Transportation (Non-Medical): No  Physical Activity: Not on file  Stress: Not on file  Social Connections: Moderately Integrated (06/16/2023)   Social Connection and Isolation Panel [NHANES]    Frequency of Communication with Friends and Family: Three times a week    Frequency of Social Gatherings with Friends and Family: Once a week    Attends Religious Services: More than 4 times per year    Active Member of [employer] or Organizations: Yes    Attends Banker Meetings: More than 4 times per year    Marital Status: Widowed   Past Medical History: History reviewed. No pertinent past medical history. History reviewed. No pertinent surgical history. Family History: History reviewed. No pertinent family history.  Hospital Course:  78 year old male presents emergency department with attempted suicide. Patient presents as a level 1 trauma due to multiple lacerations on the upper extremities with tourniquet in place. Report from EMS is that patient was found sitting in a lawn chair with multiple lacerations, dark blood pooling. Tourniquets were placed and he was transported here emergently. Patient is admitted to [facility] unit with Q15 min safety monitoring. Multidisciplinary team approach is offered. Medication management; group/milieu  therapy is offered.  During assessment on admission patient informed that he stopped his Pristiq 5 days before this admission and had worsening mood symptoms.  In the emergency room patient was started back on his Pristiq 50 mg.  On the inpatient unit patient has tolerated his medications with no problems.  Throughout the hospitalization patient displayed safe behaviors and denied SI/HI/intent/plan.  He has not  displayed any self harming behaviors.  He reports his depression improved and has been participating in the treatment team and groups.  There was an episode of diarrhea where he felt physically weak and emotionally drained.  It was identified that whenever patient feels physical exertion his mood gets down also.  Provided education to stay up with hydration and nutrition when he goes home.  Provider had discussions with his daughters over the phone and they confirmed no guns in the house, patient will be living with his daughter after discharge.  On the day of discharge patient consistently denied SI/HI/intent/plan.  Denies auditory/visual hallucinations hallucinations.  Family is very comfortable in having patient home and his daughter from [place] reports that she is going to stay with him for few weeks and patient was willing to participate in outpatient mental health services  Physical Findings: AIMS:  , ,  ,  ,    CIWA:    COWS:        Psychiatric Specialty Exam:  Presentation  General Appearance:  Appropriate for Environment; Casual  Eye Contact: Fair  Speech: Clear and Coherent  Speech Volume: Normal    Mood and Affect  Mood: Euthymic  Affect: Appropriate   Thought Process  Thought Processes: Coherent  Descriptions of Associations:Intact  Orientation:Full (Time, Place and Person)  Thought Content:Logical  Hallucinations:Hallucinations: None  Ideas of Reference:None  Suicidal Thoughts:Suicidal Thoughts: No  Homicidal Thoughts:Homicidal Thoughts: No   Sensorium  Memory: Immediate Fair; Recent Fair; Remote Fair  Judgment: Fair  Insight: Fair   Art therapist  Concentration: Fair  Attention Span: Fair  Recall: [employer] of Knowledge: Fair  Language: Fair   Psychomotor Activity  Psychomotor Activity: Psychomotor Activity: Normal  Musculoskeletal: Strength & Muscle Tone: within normal limits Gait & Station: normal Assets   Assets: Manufacturing systems engineer; Financial Resources/Insurance; Physical Health   Sleep  Sleep: Sleep: Fair    Physical Exam: Physical Exam ROS Blood pressure 124/66, pulse 79, temperature 97.9 F (36.6 C), resp. rate 18, height 5\' 9"  (1.753 m), weight 98 kg, SpO2 99%. Body mass index is 31.9 kg/m.   Social History   Tobacco Use  Smoking Status Never  Smokeless Tobacco Never   Tobacco Cessation:  N/A, patient does not currently use tobacco products   Blood Alcohol level:  Lab Results  Component Value Date   ETH <10 06/15/2023    Metabolic Disorder Labs:  No results found for: "HGBA1C", "MPG" No results found for: "PROLACTIN" No results found for: "CHOL", "TRIG", "HDL", "CHOLHDL", "VLDL", "LDLCALC"  See Psychiatric Specialty Exam and Suicide Risk Assessment completed by Attending Physician prior to discharge.  Discharge destination:  Home  Is patient on multiple antipsychotic therapies at discharge:  No   Has Patient had three or more failed trials of antipsychotic monotherapy by history:  No  Recommended Plan for Multiple Antipsychotic Therapies: NA  Discharge Instructions     Diet - low sodium heart healthy   Complete by: As directed    Increase activity slowly   Complete by: As directed    No wound care  Complete by: As directed       Allergies as of 06/22/2023   Not on File      Medication List     STOP taking these medications    acetaminophen 500 MG tablet Commonly known as: TYLENOL   testosterone 50 MG/5GM (1%) Gel Commonly known as: ANDROGEL Replaced by: Testosterone 1.62 % Gel       TAKE these medications      Indication  amLODipine 5 MG tablet Commonly known as: NORVASC Take 1 tablet (5 mg total) by mouth daily. What changed:  medication strength how much to take    aspirin EC 81 MG tablet Take 81 mg by mouth daily. Swallow whole.    carvedilol 6.25 MG tablet Commonly known as: COREG Take 6.25 mg by mouth 2 (two)  times daily.    desvenlafaxine 50 MG 24 hr tablet Commonly known as: Pristiq Take 1 tablet (50 mg total) by mouth daily. What changed:  medication strength how much to take    melatonin 5 MG Tabs Take 0.5 tablets (2.5 mg total) by mouth at bedtime as needed.  Indication: Trouble Sleeping   Testosterone 1.62 % Gel Place 1 packet onto the skin daily. Replaces: testosterone 50 MG/5GM (1%) Gel    traZODone 50 MG tablet Commonly known as: DESYREL Take 1 tablet (50 mg total) by mouth at bedtime. What changed:  medication strength how much to take         Follow-up Information     [facility]. Go on 07/11/2023.   Why: Your appointment is scheduled for 07/11/23 at 9:00 AM. Please arrive by 8:45 AM and remember to bring your insurance card.                Follow-up recommendations:  Activity:  AS tolerated    Signed: Verner Chol, MD 06/22/2023, 8:01 AM

## 2023-07-06 NOTE — Progress Notes (Signed)
 Psychiatric Initial Adult Assessment   Patient Identification: Alex Underwood MRN:  [id number] Date of Evaluation:  07/11/2023 Referral Source: Volanda Gruber, NP  Chief Complaint:   Chief Complaint  Patient presents with   Establish Care   Visit Diagnosis:    ICD-10-CM   1. MDD (major depressive disorder), recurrent, in partial remission (HCC)  F33.41 TSH    T4, free    2. Insomnia, unspecified type  G47.00     3. Vitamin D  deficiency, unspecified  E55.9 Vitamin D  1,25 dihydroxy      History of Present Illness:   Alex Underwood is a 78 y.o. 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  According to the chart review, he was admitted to [facility] 99/464 "78 year old male presents emergency department with attempted suicide. Patient presents as a level 1 trauma due to multiple lacerations on the upper extremities with tourniquet in place. Report from EMS is that patient was found sitting in a lawn chair with multiple lacerations, dark blood pooling."  Reviewed a note of "new patient summary for psychiatrist," prepared by his daughter, and the patient, which includes his history of depression, and recent admission.   He states that his daughter in [place]  helped to fill out the form.  He states that this is accurate.  He was admitted to [facility] after suicidal attempt. He states that the suicide attempt was not planned. He was feeling very exhausted.  He struggled with sinus infection for 4 years, severe hypertension, and had a COVID.  This venlafaxine  was increased up to 100 mg, and Norvasc  was up titrated.  He states that he became slowly incapacitated afterwards.  He tried to go into the yard, and he could not due to exhaustion.  He "gave up."  He attempted to stay in the car with the engine running in the garage.  It did not work, and he then used a Interior and spatial designer and cut his wrist.  It  stopped bleeding, and he cut his upper extremity. There was pool of blood, and he felt cold. He thought that if he does not do anything, he is going to be bed. He crawled out of the garage to ask for help. He adamantly denies any SI since discharge, stating that suicide is not an answer for him or his family.  He also states that it is too hurtful for people to live. He states that he has been on the same medication regimen for 12 years, and would like to stay the same.  He reports stress related to the current relationship with 79 year old lady, Crystal.  Although it was wonderful for 2 years, she has been negative since suffering from hip pain.  She can be negative towards the world, and "whatever I do." He shared an example of her being negative when he gave roses from the garden.  He states that he lost his wife in 2022.  They were together for 51 years.  She became verbally demanding and critical, and abused alcohol after having hip fracture.  He agrees that the interaction with his girlfriend reminds him of his wife.  When Crystal becomes negative, he asks her not to be negative as he is trying to heal. Although he does not know where the relationship goes as they have "such a good life together," it is good that she lives at her own place, and he can leave when  he wants to. He states that he feels stronger.  Although he needs to use a walker when he was in the hospital, he is walking a lot and does exercise.  He wants to contribute to his family.   Depression- The patient has mood symptoms as in PHQ-9/GAD-7.  He denies feeling depressed.  He has occasional insomnia. He uses CPAP machine. He loves saying love to his family, and hug at night. He was able to go to church, and enjoyed the time. He does deep breathing when he feels anxious, and enjoys tai chi.   He states that he is concentrating on positive part of the day, and states that suicide is not on the table.   Anxiety-he feels his head gets full,  and he is worried of failing ("man do not do well with the failure.") He denies panic attacks.    Support: 2 daughters Household: daughter, 3 grandchildren (age 59, 67), they have chickens, cows Marital status: Crystal, in relationship of two years, widower (her wife deceased in 09/03/20 after 68 years of marriage, she later suffered from alcohol, and was critical) Number of children:  2 daughters Employment:  Education:   Family of 7 siblings, four of them including depression. His brother died by suicide in 2022/12/05. His uncle died by suicide when he was a teenager.   Medication- Desvenlafaxine  50 mg daily, Trazodone  75 mg at night, melatonin 2.5 mg at night   Substance use  Tobacco Alcohol Other substances/  Current  Denies since 09/04/2011 Denies, two cups of coffee  Past  Used to drink "a lot" (per form, not consumed for 20 years) denies  Past Treatment  AA meeting        Associated Signs/Symptoms: Depression Symptoms:  insomnia, (Hypo) Manic Symptoms:   denies decreased need for sleep, euphoria Anxiety Symptoms:   mild anxiety  Psychotic Symptoms:   denies AH, VH, paranoia PTSD Symptoms: Had a traumatic exposure:  as above Re-experiencing:  None Hypervigilance:  No Hyperarousal:  None Avoidance:  None  Past Psychiatric History:  Outpatient:  Psychiatry admission: [facility] after suicide attempt in 06/2023 Previous suicide attempt: once, cutting himself with kitchen knife in 06/2023 Past trials of medication: desvenlafaxine  History of violence: denies History of head injury:   Previous Psychotropic Medications: Yes   Substance Abuse History in the last 12 months:  No.  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 forehead. Superficial and nodular patterns. Excised 03/04/2015, margins free.   Sleep apnea    USES CPAP   Squamous cell lung cancer [facility])     Past Surgical History:  Procedure Laterality Date   COLONOSCOPY     COLONOSCOPY WITH PROPOFOL  N/A 09/20/2019   Procedure: COLONOSCOPY WITH PROPOFOL ;  Surgeon: Luke Salaam, MD;  Location: [facility] ENDOSCOPY;  Service: Gastroenterology;  Laterality: N/A;   EYE SURGERY Bilateral    CATARACTS   MOHS SURGERY  09/04/2007   forehead; [facility], [facility]   PAROTIDECTOMY Right 07/21/2015   Procedure: PAROTIDECTOMY;  Surgeon: Mellody Sprout, MD;  Location: [facility] ORS;  Service: ENT;  Laterality: Right;  PENILE PROSTHESIS IMPLANT     PROSTATECTOMY     TOE SURGERY      Family Psychiatric History: as below Family of 7 siblings, four of them including depression. His brother died by suicide in 12-10-2022. His uncle died by suicide when he was a teenager.   Family History:  Family History  Problem Relation Age of Onset   Skin cancer Sister    Skin cancer Brother     Social History:   Social History   Socioeconomic History   Marital status: Widowed    Spouse name: Not on file   Number of children: 2   Years of education: Not on file   Highest education level: Bachelor's degree (e.g., BA, AB, BS)  Occupational History   Not on file  Tobacco Use   Smoking status: Never   Smokeless tobacco: Never  Vaping Use   Vaping status: Never Used  Substance and Sexual Activity   Alcohol use: No    Alcohol/week: 24.0 standard drinks of alcohol    Types: 24 Cans of beer per week   Drug use: No   Sexual activity: Not Currently  Other Topics Concern   Not on file  Social History Narrative   ** Merged History Encounter **       Social Drivers of Health   Financial Resource Strain: Not on file  Food Insecurity: No Food Insecurity (06/16/2023)   Hunger Vital Sign    Worried About Running Out of Food  in the Last Year: Never true    Ran Out of Food in the Last Year: Never true  Transportation Needs: No Transportation Needs (06/16/2023)   PRAPARE - Administrator, Civil Service (Medical): No    Lack of Transportation (Non-Medical): No  Physical Activity: Not on file  Stress: Not on file  Social Connections: Moderately Integrated (06/16/2023)   Social Connection and Isolation Panel [NHANES]    Frequency of Communication with Friends and Family: Three times a week    Frequency of Social Gatherings with Friends and Family: Once a week    Attends Religious Services: More than 4 times per year    Active Member of [employer] or Organizations: Yes    Attends Banker Meetings: More than 4 times per year    Marital Status: Widowed    Additional Social History: as above  Allergies:   Allergies  Allergen Reactions   Codeine Other (See Comments)    Hallucinations.   Lamisil [Terbinafine] Rash   Penicillins Rash   Terbinafine Rash    Metabolic Disorder Labs: No results found for: "HGBA1C", "MPG" No results found for: "PROLACTIN" No results found for: "CHOL", "TRIG", "HDL", "CHOLHDL", "VLDL", "LDLCALC" Lab Results  Component Value Date   TSH 0.500 07/11/2023    Therapeutic Level Labs: No results found for: "LITHIUM" No results found for: "CBMZ" No results found for: "VALPROATE"  Current Medications: Current Outpatient Medications  Medication Sig Dispense Refill   amLODipine  (NORVASC ) 5 MG tablet Take 1 tablet (5 mg total) by mouth daily. 30 tablet 0   aspirin  EC 81 MG tablet Take 81 mg by mouth daily.     desvenlafaxine  (PRISTIQ ) 50 MG 24 hr tablet Take 1 tablet (50 mg total) by mouth daily. 30 tablet 0   melatonin 5 MG TABS Take 0.5 tablets (2.5 mg total) by mouth at bedtime as needed. 30 tablet 0   Testosterone  1.62 % GEL Place 1 packet onto the skin daily. 1 g 0  traZODone  (DESYREL ) 150 MG tablet Take 75 mg by mouth at bedtime.     No current  facility-administered medications for this visit.    Musculoskeletal: Strength & Muscle Tone: within normal limits Gait & Station: normal Patient leans: N/A  Psychiatric Specialty Exam: Review of Systems  Psychiatric/Behavioral:  Positive for sleep disturbance. Negative for agitation, behavioral problems, confusion, decreased concentration, dysphoric mood, hallucinations, self-injury and suicidal ideas. The patient is nervous/anxious. The patient is not hyperactive.   All other systems reviewed and are negative.   Blood pressure 136/68, pulse 76, temperature 97.7 F (36.5 C), temperature source Temporal, height 5\' 9"  (1.753 m), weight 221 lb 3.2 oz (100.3 kg), SpO2 99%.Body mass index is 32.67 kg/m.  General Appearance: Well Groomed  Eye Contact:  Good  Speech:  Clear and Coherent  Volume:  Normal  Mood:   stronger  Affect:  Appropriate, Congruent, and Full Range  Thought Process:  Coherent  Orientation:  Full (Time, Place, and Person)  Thought Content:  Logical  Suicidal Thoughts:  No  Homicidal Thoughts:  No  Memory:  Immediate;   Good  Judgement:  Good  Insight:  Good  Psychomotor Activity:  Normal  Concentration:  Concentration: Good and Attention Span: Good  Recall:  Good  Fund of Knowledge:Good  Language: Good  Akathisia:  No  Handed:  Right  AIMS (if indicated):  not done  Assets:  Communication Skills Desire for Improvement  ADL's:  Intact  Cognition: WNL  Sleep:  Good   Screenings: AUDIT    Flowsheet Row Admission (Discharged) from 06/16/2023 in [facility]  Alcohol Use Disorder Identification Test Final Score (AUDIT) 0      GAD-7    Flowsheet Row Office Visit from 07/11/2023 in [facility]  Total GAD-7 Score 5      PHQ2-9    Flowsheet Row Office Visit from 07/11/2023 in [facility] Follow Up  from 12[facility]2016 [facility]  PHQ-2 Total Score 3 0  PHQ-9 Total Score 15 --      Flowsheet Row Office Visit from 07/11/2023 in [facility] Admission (Discharged) from 06/16/2023 in [facility] ED from 05/19/2023 in [facility] Emergency Department at [facility]  C-SSRS RISK CATEGORY Error: Q3, 4, or 5 should not be populated when Q2 is No High Risk No Risk       Assessment and Plan:  Alex Underwood is a 78 y.o. 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    1. MDD (major depressive disorder), recurrent, in partial remission (HCC) # anxiety state Acute stressors include: occasional conflict with his girlfriend of two years, who can be negative  Other stressors include: loss of his wife of 51 years in 2022, who later suffered from alcohol abuse and being critical towards him  History:  depression since late 50's, on Pristiq  50 mg daily since 2012. Worsening in depression since resection of prostate cancer. [facility] 06/2023 after SA of cutting himself, family history of depression, suicide attempt, including his brother, who died in 12/09/2022  He reports improvement in depressive symptoms, and anxiety is more manageable since being discharged from [place].  He reports occasional conflict with his girlfriend of two years, whose negativity reminds him of his deceased wife, who was critical and struggled with alcohol abuse.  He maintains strong relationships with both daughters, remains connected with his family including grandchildren, and reports strong support from his church.  He is also willing to engage in treatment and occasionally takes notes during the visit.  He reports concern about adverse reaction from medication adjustment based on his history.  Given his mood has been improving and his strong preference, we will continue on current  medication regimen.  Will continue Pristiq  to target depression and anxiety.  Will continue trazodone  as needed for insomnia.  Noted that he has cognitive distortion of catastrophizing, related to perceived failure.  He will greatly benefit from CBT.  He will discuss this option with his daughter.  We will obtain labs to rule out medical health issues contributing to his symptoms.   2. Insomnia, unspecified type Overall stable.  Will continue trazodone  as needed for insomnia along with melatonin.   Plan (he declined refill) Continue Pristiq  50 mg daily  Continue trazodone  75 mg at night as needed for insomnia Obtain lab (TSH, vitamin D ) at [facility] Next appointment: 6/12 at 10:30, IP - on melatonin 2.5 mg at night  The patient demonstrates the following risk factors for suicide: Chronic risk factors for suicide include: psychiatric disorder of depression, anxiety, recent history of suicide attempt, and admission in April 2025 . Acute risk factors for suicide include: family or marital conflict and loss (financial, interpersonal, professional). Protective factors for this patient include: positive social support, coping skills, and hope for the future. Considering these factors, the overall suicide risk at this point appears to be moderate, but not at imminent risk. He denies gun access at home. Emergency resources which includes 911, ED, suicide crisis line (988) are discussed.  Patient is appropriate for outpatient follow up.   A total of 70 minutes was spent on the following activities during the encounter date, which includes but is not limited to: preparing to see the patient (e.g., reviewing tests and records), obtaining and/or reviewing separately obtained history, performing a medically necessary examination or evaluation, counseling and educating the patient, family, or caregiver, ordering medications, tests, or procedures, referring and communicating with other healthcare professionals (when not  reported separately), documenting clinical information in the electronic or paper health record, independently interpreting test or lab results and communicating these results to the family or caregiver, and coordinating care (when not reported separately).    Collaboration of Care: Other reviewed notes in Epic  Patient/Guardian was advised Release of Information must be obtained prior to any record release in order to collaborate their care with an outside provider. Patient/Guardian was advised if they have not already done so to contact the registration department to sign all necessary forms in order for us  to release information regarding their care.   Consent: Patient/Guardian gives verbal consent for treatment and assignment of benefits for services provided during this visit. Patient/Guardian expressed understanding and agreed to proceed.   Todd Fossa, MD 4/28/20256:31 PM

## 2023-07-11 ENCOUNTER — Other Ambulatory Visit
Admission: RE | Admit: 2023-07-11 | Discharge: 2023-07-11 | Disposition: A | Discharge status: Home / Self Care | Source: Ambulatory Visit | Attending: Psychiatry | Admitting: Psychiatry

## 2023-07-11 ENCOUNTER — Encounter: Payer: Self-pay | Admitting: Psychiatry

## 2023-07-11 ENCOUNTER — Ambulatory Visit (INDEPENDENT_AMBULATORY_CARE_PROVIDER_SITE_OTHER): Admitting: Psychiatry

## 2023-07-11 VITALS — BP 136/68 | HR 76 | Temp 97.7°F | Ht 69.0 in | Wt 221.2 lb

## 2023-07-11 DIAGNOSIS — E559 Vitamin D deficiency, unspecified: Secondary | ICD-10-CM | POA: Diagnosis present

## 2023-07-11 DIAGNOSIS — F3341 Major depressive disorder, recurrent, in partial remission: Secondary | ICD-10-CM | POA: Insufficient documentation

## 2023-07-11 DIAGNOSIS — G4700 Insomnia, unspecified: Secondary | ICD-10-CM | POA: Diagnosis not present

## 2023-07-11 LAB — T4, FREE: Free T4: 0.89 ng/dL (ref 0.61–1.12)

## 2023-07-11 LAB — TSH: TSH: 0.5 u[IU]/mL (ref 0.350–4.500)

## 2023-07-11 NOTE — Patient Instructions (Addendum)
 Continue Pristiq  50 mg daily  Continue trazodone  75 mg at night as needed for insomnia Obtain lab (TSH, vitamin D) at [facility] Next appointment: 6/12 at 10:30

## 2023-07-20 LAB — VITAMIN D 1,25 DIHYDROXY
Vitamin D 1, 25 (OH)2 Total: 62 pg/mL
Vitamin D2 1, 25 (OH)2: <10 pg/mL
Vitamin D3 1, 25 (OH)2: 55 pg/mL

## 2023-08-02 ENCOUNTER — Ambulatory Visit: Payer: Medicare Other | Admitting: Dermatology

## 2023-08-03 ENCOUNTER — Telehealth: Payer: Self-pay | Admitting: Psychiatry

## 2023-08-03 NOTE — Telephone Encounter (Signed)
 Patient daughter called to cancel his appointment in 08-18-2023. States he passed away Jul 29, 2023.

## 2023-08-14 DEATH — deceased

## 2023-08-25 ENCOUNTER — Ambulatory Visit: Admitting: Psychiatry
